# Patient Record
Sex: Female | Born: 1970
Health system: Southern US, Community
[De-identification: ages and names within clinical notes are randomized; demographics above are authoritative.]

## PROBLEM LIST (undated history)

## (undated) DIAGNOSIS — E559 Vitamin D deficiency, unspecified: Secondary | ICD-10-CM

## (undated) DIAGNOSIS — T7840XA Allergy, unspecified, initial encounter: Secondary | ICD-10-CM

## (undated) DIAGNOSIS — J302 Other seasonal allergic rhinitis: Secondary | ICD-10-CM

## (undated) HISTORY — DX: Other seasonal allergic rhinitis: J30.2

## (undated) HISTORY — DX: Allergy, unspecified, initial encounter: T78.40XA

## (undated) HISTORY — PX: TUBAL LIGATION: SHX77

## (undated) HISTORY — PX: HERNIA REPAIR: SHX51

---

## 1999-07-08 ENCOUNTER — Encounter: Payer: Self-pay | Admitting: Family Medicine

## 1999-07-08 ENCOUNTER — Encounter: Admission: RE | Admit: 1999-07-08 | Discharge: 1999-07-08 | Payer: Self-pay | Admitting: Family Medicine

## 2001-05-10 ENCOUNTER — Other Ambulatory Visit: Admission: RE | Admit: 2001-05-10 | Discharge: 2001-05-10 | Payer: Self-pay | Admitting: Gynecology

## 2001-10-07 ENCOUNTER — Encounter: Admission: RE | Admit: 2001-10-07 | Discharge: 2001-10-07 | Payer: Self-pay | Admitting: Gynecology

## 2001-12-11 ENCOUNTER — Inpatient Hospital Stay (HOSPITAL_COMMUNITY): Admission: AD | Admit: 2001-12-11 | Discharge: 2001-12-14 | Payer: Self-pay | Admitting: Gynecology

## 2001-12-11 ENCOUNTER — Inpatient Hospital Stay (HOSPITAL_COMMUNITY): Admission: AD | Admit: 2001-12-11 | Discharge: 2001-12-11 | Payer: Self-pay | Admitting: Gynecology

## 2001-12-11 ENCOUNTER — Encounter (INDEPENDENT_AMBULATORY_CARE_PROVIDER_SITE_OTHER): Payer: Self-pay | Admitting: Specialist

## 2002-01-23 ENCOUNTER — Other Ambulatory Visit: Admission: RE | Admit: 2002-01-23 | Discharge: 2002-01-23 | Payer: Self-pay | Admitting: *Deleted

## 2002-01-26 ENCOUNTER — Encounter: Admission: RE | Admit: 2002-01-26 | Discharge: 2002-01-26 | Payer: Self-pay | Admitting: *Deleted

## 2002-04-26 ENCOUNTER — Ambulatory Visit (HOSPITAL_COMMUNITY): Admission: RE | Admit: 2002-04-26 | Discharge: 2002-04-26 | Payer: Self-pay | Admitting: Internal Medicine

## 2002-04-26 ENCOUNTER — Encounter: Payer: Self-pay | Admitting: Internal Medicine

## 2002-05-23 ENCOUNTER — Emergency Department (HOSPITAL_COMMUNITY): Admission: EM | Admit: 2002-05-23 | Discharge: 2002-05-23 | Payer: Self-pay | Admitting: Emergency Medicine

## 2003-04-16 ENCOUNTER — Other Ambulatory Visit: Admission: RE | Admit: 2003-04-16 | Discharge: 2003-04-16 | Payer: Self-pay | Admitting: Gynecology

## 2003-08-07 ENCOUNTER — Encounter: Admission: RE | Admit: 2003-08-07 | Discharge: 2003-11-05 | Payer: Self-pay | Admitting: Gynecology

## 2003-08-27 ENCOUNTER — Inpatient Hospital Stay (HOSPITAL_COMMUNITY): Admission: AD | Admit: 2003-08-27 | Discharge: 2003-08-27 | Payer: Self-pay | Admitting: Gynecology

## 2003-11-12 ENCOUNTER — Encounter (INDEPENDENT_AMBULATORY_CARE_PROVIDER_SITE_OTHER): Payer: Self-pay | Admitting: Specialist

## 2003-11-12 ENCOUNTER — Inpatient Hospital Stay (HOSPITAL_COMMUNITY): Admission: RE | Admit: 2003-11-12 | Discharge: 2003-11-15 | Payer: Self-pay | Admitting: Gynecology

## 2003-12-24 ENCOUNTER — Other Ambulatory Visit: Admission: RE | Admit: 2003-12-24 | Discharge: 2003-12-24 | Payer: Self-pay | Admitting: Gynecology

## 2004-08-04 ENCOUNTER — Encounter (INDEPENDENT_AMBULATORY_CARE_PROVIDER_SITE_OTHER): Payer: Self-pay | Admitting: *Deleted

## 2004-08-04 ENCOUNTER — Encounter: Admission: RE | Admit: 2004-08-04 | Discharge: 2004-08-04 | Payer: Self-pay | Admitting: *Deleted

## 2004-11-28 ENCOUNTER — Other Ambulatory Visit: Admission: RE | Admit: 2004-11-28 | Discharge: 2004-11-28 | Payer: Self-pay | Admitting: Family Medicine

## 2006-04-26 ENCOUNTER — Other Ambulatory Visit: Admission: RE | Admit: 2006-04-26 | Discharge: 2006-04-26 | Payer: Self-pay | Admitting: Family Medicine

## 2006-05-12 ENCOUNTER — Encounter: Admission: RE | Admit: 2006-05-12 | Discharge: 2006-06-08 | Payer: Self-pay | Admitting: Family Medicine

## 2007-02-13 ENCOUNTER — Emergency Department (HOSPITAL_COMMUNITY): Admission: EM | Admit: 2007-02-13 | Discharge: 2007-02-13 | Payer: Self-pay | Admitting: Emergency Medicine

## 2007-02-24 LAB — CONVERTED CEMR LAB: Pap Smear: NEGATIVE

## 2007-07-19 ENCOUNTER — Other Ambulatory Visit: Admission: RE | Admit: 2007-07-19 | Discharge: 2007-07-19 | Payer: Self-pay | Admitting: Gynecology

## 2007-08-01 ENCOUNTER — Ambulatory Visit (HOSPITAL_COMMUNITY): Admission: RE | Admit: 2007-08-01 | Discharge: 2007-08-01 | Payer: Self-pay | Admitting: *Deleted

## 2009-08-08 ENCOUNTER — Ambulatory Visit: Payer: Self-pay | Admitting: Internal Medicine

## 2009-08-08 LAB — CONVERTED CEMR LAB
ALT: 41 units/L — ABNORMAL HIGH (ref 0–35)
AST: 31 units/L (ref 0–37)
Albumin: 3.7 g/dL (ref 3.5–5.2)
Alkaline Phosphatase: 74 units/L (ref 39–117)
BUN: 10 mg/dL (ref 6–23)
Basophils Absolute: 0 10*3/uL (ref 0.0–0.1)
Basophils Relative: 0.4 % (ref 0.0–3.0)
Bilirubin Urine: NEGATIVE
Bilirubin, Direct: 0.1 mg/dL (ref 0.0–0.3)
CO2: 29 meq/L (ref 19–32)
Calcium: 8.7 mg/dL (ref 8.4–10.5)
Chloride: 108 meq/L (ref 96–112)
Cholesterol: 217 mg/dL — ABNORMAL HIGH (ref 0–200)
Creatinine, Ser: 0.5 mg/dL (ref 0.4–1.2)
Direct LDL: 156.4 mg/dL
Eosinophils Absolute: 0.1 10*3/uL (ref 0.0–0.7)
Eosinophils Relative: 2.4 % (ref 0.0–5.0)
GFR calc non Af Amer: 164.67 mL/min (ref >60)
Glucose, Bld: 83 mg/dL (ref 70–99)
HCT: 36.5 % (ref 36.0–46.0)
HDL: 40 mg/dL (ref >39.00)
Hemoglobin: 12.5 g/dL (ref 12.0–15.0)
Ketones, ur: NEGATIVE mg/dL
Leukocytes, UA: NEGATIVE
Lymphocytes Relative: 28.9 % (ref 12.0–46.0)
Lymphs Abs: 1.7 10*3/uL (ref 0.7–4.0)
MCHC: 34.3 g/dL (ref 30.0–36.0)
MCV: 80.4 fL (ref 78.0–100.0)
Monocytes Absolute: 0.5 10*3/uL (ref 0.1–1.0)
Monocytes Relative: 7.7 % (ref 3.0–12.0)
Neutro Abs: 3.6 10*3/uL (ref 1.4–7.7)
Neutrophils Relative %: 60.6 % (ref 43.0–77.0)
Nitrite: NEGATIVE
Platelets: 272 10*3/uL (ref 150.0–400.0)
Potassium: 5.1 meq/L (ref 3.5–5.1)
RBC: 4.55 M/uL (ref 3.87–5.11)
RDW: 14.4 % (ref 11.5–14.6)
Sodium: 141 meq/L (ref 135–145)
Specific Gravity, Urine: 1.025 (ref 1.000–1.030)
TSH: 0.51 microintl units/mL (ref 0.35–5.50)
Total Bilirubin: 0.3 mg/dL (ref 0.3–1.2)
Total CHOL/HDL Ratio: 5
Total Protein, Urine: NEGATIVE mg/dL
Total Protein: 6.7 g/dL (ref 6.0–8.3)
Triglycerides: 101 mg/dL (ref 0.0–149.0)
Urine Glucose: NEGATIVE mg/dL
Urobilinogen, UA: 0.2 (ref 0.0–1.0)
VLDL: 20.2 mg/dL (ref 0.0–40.0)
WBC: 5.9 10*3/uL (ref 4.5–10.5)
pH: 6.5 (ref 5.0–8.0)

## 2009-08-12 ENCOUNTER — Ambulatory Visit: Payer: Self-pay | Admitting: Internal Medicine

## 2009-08-12 DIAGNOSIS — E669 Obesity, unspecified: Secondary | ICD-10-CM | POA: Insufficient documentation

## 2009-08-12 DIAGNOSIS — Z9189 Other specified personal risk factors, not elsewhere classified: Secondary | ICD-10-CM | POA: Insufficient documentation

## 2009-08-12 DIAGNOSIS — O99810 Abnormal glucose complicating pregnancy: Secondary | ICD-10-CM | POA: Insufficient documentation

## 2009-08-12 DIAGNOSIS — R51 Headache: Secondary | ICD-10-CM | POA: Insufficient documentation

## 2009-08-12 DIAGNOSIS — M4120 Other idiopathic scoliosis, site unspecified: Secondary | ICD-10-CM | POA: Insufficient documentation

## 2009-08-12 DIAGNOSIS — R519 Headache, unspecified: Secondary | ICD-10-CM | POA: Insufficient documentation

## 2009-08-12 DIAGNOSIS — E785 Hyperlipidemia, unspecified: Secondary | ICD-10-CM | POA: Insufficient documentation

## 2009-08-14 ENCOUNTER — Encounter: Payer: Self-pay | Admitting: Internal Medicine

## 2009-08-16 ENCOUNTER — Emergency Department (HOSPITAL_COMMUNITY): Admission: EM | Admit: 2009-08-16 | Discharge: 2009-08-17 | Payer: Self-pay | Admitting: Emergency Medicine

## 2010-03-25 NOTE — Assessment & Plan Note (Signed)
Summary: NEW PT PHYSICAL--[employer]--PKG--S-TC   Vital Signs:  Patient profile:   40 year old female Height:      62 inches (157.48 cm) Weight:      161.8 pounds (73.55 kg) BMI:     29.70 O2 Sat:      97 % on Room air Temp:     97.4 degrees F (36.33 degrees C) oral Pulse rate:   83 / minute BP sitting:   112 / 80  (left arm) Cuff size:   regular  Vitals Entered By: Orlan Leavens (August 12, 2009 3:18 PM)  O2 Flow:  Room air CC: New patient CPX Is Patient Diabetic? No Pain Assessment Patient in pain? no        Primary Care Provider:  Newt Lukes MD  CC:  New patient CPX.  History of Present Illness: new pt to me and our practice, here to est care  patient is here today for annual physical. Patient feels well and has no complaints.   also has 2 other concerns-  1) obesity and weight gain - over 20# up since her 2nd pregnancy - attributes to poor diet and no time for exercise - +personal hx gestational DM -  wanted lab check to be sure DM not problem now no bowle changes or med changes -  2) back pain - ongoing for months - pain from base of neck down to waist - pain precipitated by lying down -- onset of pain if lying down >4 hours -  has to get up in the night b/c pain - tried muscle relaxants in past w/o relief of pain - no leg pain or numbness - no injury or falls- hx scoliosis  Preventive Screening-Counseling & Management  Alcohol-Tobacco     Alcohol drinks/day: 0     Alcohol Counseling: not indicated; patient does not drink     Smoking Status: never     Tobacco Counseling: not indicated; no tobacco use  Caffeine-Diet-Exercise     Diet Counseling: to improve diet; diet is suboptimal     Nutrition Referrals: yes     Does Patient Exercise: no     Exercise Counseling: to improve exercise regimen     Depression Counseling: not indicated; screening negative for depression  Safety-Violence-Falls     Seat Belt Use: yes     Seat Belt Counseling: not  indicated; patient wears seat belts     Helmet Counseling: not indicated; patient wears helmet when riding bicycle/motocycle     Firearms in the Home: no firearms in the home     Firearm Counseling: not applicable     Violence Counseling: not indicated; no violence risk noted     Fall Risk Counseling: not indicated; no significant falls noted  Clinical Review Panels:  Prevention   Last Mammogram:  No specific mammographic evidence of malignancy.   (02/23/2006)   Last Pap Smear:  Interpretation/Result:Negative for intraepithelial Lesion or Malignancy.    (02/24/2007)  Immunizations   Last Tetanus Booster:  Tdap (08/12/2009)  Lipid Management   Cholesterol:  217 (08/08/2009)   HDL (good cholesterol):  40.00 (08/08/2009)  CBC   WBC:  5.9 (08/08/2009)   RBC:  4.55 (08/08/2009)   Hgb:  12.5 (08/08/2009)   Hct:  36.5 (08/08/2009)   Platelets:  272.0 (08/08/2009)   MCV  80.4 (08/08/2009)   MCHC  34.3 (08/08/2009)   RDW  14.4 (08/08/2009)   PMN:  60.6 (08/08/2009)   Lymphs:  28.9 (08/08/2009)   Monos:  7.7 (08/08/2009)   Eosinophils:  2.4 (08/08/2009)   Basophil:  0.4 (08/08/2009)  Complete Metabolic Panel   Glucose:  83 (08/08/2009)   Sodium:  141 (08/08/2009)   Potassium:  5.1 (08/08/2009)   Chloride:  108 (08/08/2009)   CO2:  29 (08/08/2009)   BUN:  10 (08/08/2009)   Creatinine:  0.5 (08/08/2009)   Albumin:  3.7 (08/08/2009)   Total Protein:  6.7 (08/08/2009)   Calcium:  8.7 (08/08/2009)   Total Bili:  0.3 (08/08/2009)   Alk Phos:  74 (08/08/2009)   SGPT (ALT):  41 (08/08/2009)   SGOT (AST):  31 (08/08/2009)   Current Medications (verified): 1)  None  Allergies (verified): No Known Drug Allergies  Past History:  Past Medical History: Thyroid problem, hx (postpartum 2003) scoliosis, childhood gestational diabetes = 2003 + 2005  Past Surgical History: Caesarean section x's 2 (03 & 05) Inguinal herniorrhaphy (2010)  Family History: Family History  Diabetes 1st degree relative (grandparent) Family History Hypertension (grandparent) Dad - age 69y- CAD/Stent mom - age 38 - ?heart valve problem  Social History: Never Smoked, no alcohol married, lives with spouse and 2 kids  employed with Set [employer] - standing all daySmoking Status:  never Does Patient Exercise:  no Seat Belt Use:  yes  Review of Systems       see HPI above. I have reviewed all other systems and they were negative.   Physical Exam  General:  overweight-appearing.  alert, well-developed, well-nourished, and cooperative to examination.    Eyes:  vision grossly intact; pupils equal, round and reactive to light.  conjunctiva and lids normal.   wears glasses Ears:  normal pinnae bilaterally, without erythema, swelling, or tenderness to palpation. TMs clear, without effusion, or cerumen impaction. Hearing grossly normal bilaterally  Mouth:  teeth and gums in good repair; mucous membranes moist, without lesions or ulcers. oropharynx clear without exudate, no erythema.  Neck:  supple, full ROM, no masses, no thyromegaly; no thyroid nodules or tenderness. no JVD or carotid bruits.   Lungs:  normal respiratory effort, no intercostal retractions or use of accessory muscles; normal breath sounds bilaterally - no crackles and no wheezes.    Heart:  normal rate, regular rhythm, no murmur, and no rub. BLE without edema.     Abdomen:  soft, non-tender, normal bowel sounds, no distention; no masses and no appreciable hepatomegaly or splenomegaly.   Genitalia:  defer to gyn Msk:  No deformity or significant scoliosis noted of thoracic or lumbar spine.  no joint effusions knnes or ankles - back: full range of motion of lumbar spine. Nontender to palpation. Negative straight leg raise. Deep tendon reflexes symmetrically intact at Achilles and patella, negative clonus. Sensation intact throughout all dermatomes in bilateral lower extremities. Full strength to manual muscle testing in all  major muscule groups including EHL, anterior tibialis, gastrocnemius, quadriceps, and iliopsoas. Able to heel and toe walk without difficulty and ambulates with a normal gait.  Neurologic:  alert & oriented X3 and cranial nerves II-XII symetrically intact.  strength normal in all extremities, sensation intact to light touch, and gait normal. speech fluent without dysarthria or aphasia; follows commands with good comprehension.  Skin:  no rashes, vesicles, ulcers, or erythema. No nodules or irregularity to palpation.  Psych:  Oriented X3, memory intact for recent and remote, normally interactive, good eye contact, not anxious appearing, not depressed appearing, and not agitated.      Impression & Recommendations:  Problem # 1:  PREVENTIVE HEALTH  CARE (ICD-V70.0) Patient has been counseled on age-appropriate routine health concerns for screening and prevention. These are reviewed and up-to-date. Immunizations are up-to-date or declined. Labs reviewed - will watch cholesterol and work and weight loss  Problem # 2:  DYSLIPIDEMIA (ICD-272.4) likely related to weight gain - refer to nutritionist for help with weight reduction - reck 6-12 mos - can consider tx given FH idf no sig change with weight loss Labs Reviewed: SGOT: 31 (08/08/2009)   SGPT: 41 (08/08/2009)   HDL:40.00 (08/08/2009)  Chol:217 (08/08/2009)  Trig:101.0 (08/08/2009)  Problem # 3:  SCOLIOSIS (ICD-737.30) hx same, now with nighttime back pain if lying down - refer for update eval and tx of pain regardless of cause - prior tx muscle relaxant ineffective per pt - neuro exam benign Orders: Orthopedic Surgeon Referral (Ortho Surgeon)  Problem # 4:  OBESITY (ICD-278.00) no thyroid or DM problem at this time on CPX labs - refer to nutrition for help with weight loss efforts, esp with  dyslipidemia and hx gest DM Orders: Nutrition Referral (Nutrition)  Ht: 62 (08/12/2009)   Wt: 161.8 (08/12/2009)   BMI: 29.70  (08/12/2009)  Problem # 5:  Hx of GESTATIONAL DIABETES (ICD-648.80) hx noted - 2005 and 2005 - glc normal fasting on cpx labs - importance of weight control as above Orders: Nutrition Referral (Nutrition)  Other Orders: Tdap => 20yrs IM (13086) Admin 1st Vaccine (57846)  Patient Instructions: 1)  it was good to see you today. 2)  labs reviewed - everything looks good except cholesterol -  we will watch this at future visits - 3)  it is important that you work on losing weight - monitor your diet and consume fewer calories such as less carbohydrates (sugar) and less fat. you also need to increase your physical activity level - start by walking for 10-20 minutes 3 times per week and work up to 30 minutes 4-5 times each week.  4)  we'll make referral to nurtitionist to help with this goal of weight loss. Our office will contact you regarding this appointment once made.  5)  also we'll make referral to scoliosis and back specialist for your back pain. Our office will contact you regarding this appointment once made.  6)  Please schedule a follow-up appointment in 6-12 months on cholesterol, sooner if problems.    Immunizations Administered:  Tetanus Vaccine:    Vaccine Type: Tdap    Site: left deltoid    Mfr: GlaxoSmithKline    Dose: 0.5 ml    Route: IM    Given by: Orlan Leavens    Exp. Date: 05/17/2011    Lot #: [id number]    VIS given: 08/12/09    Mammogram  Procedure date:  02/23/2006  Findings:      No specific mammographic evidence of malignancy.    Pap Smear  Procedure date:  02/24/2007  Findings:      Interpretation/Result:Negative for intraepithelial Lesion or Malignancy.

## 2010-03-25 NOTE — Letter (Signed)
Summary: No show/Nutri-Dbs-Mgmt  No show/Nutri-Dbs-Mgmt   Imported By: Lester [place] 08/16/2009 08:20:05  _____________________________________________________________________  External Attachment:    Type:   Image     Comment:   External Document

## 2010-05-11 LAB — BASIC METABOLIC PANEL
BUN: 8 mg/dL (ref 6–23)
CO2: 22 mEq/L (ref 19–32)
Calcium: 8.4 mg/dL (ref 8.4–10.5)
Chloride: 111 mEq/L (ref 96–112)
Creatinine, Ser: 0.49 mg/dL (ref 0.4–1.2)
GFR calc Af Amer: >60 mL/min (ref >60)
GFR calc non Af Amer: >60 mL/min (ref >60)
Glucose, Bld: 101 mg/dL — ABNORMAL HIGH (ref 70–99)
Potassium: 3.9 mEq/L (ref 3.5–5.1)
Sodium: 138 mEq/L (ref 135–145)

## 2010-05-11 LAB — CK TOTAL AND CKMB (NOT AT ARMC)
CK, MB: 1.2 ng/mL (ref 0.3–4.0)
Relative Index: 0.7 (ref 0.0–2.5)
Total CK: 180 U/L — ABNORMAL HIGH (ref 7–177)

## 2010-05-11 LAB — CBC
HCT: 34.7 % — ABNORMAL LOW (ref 36.0–46.0)
Hemoglobin: 11.3 g/dL — ABNORMAL LOW (ref 12.0–15.0)
MCH: 26.6 pg (ref 26.0–34.0)
MCHC: 32.6 g/dL (ref 30.0–36.0)
MCV: 81.5 fL (ref 78.0–100.0)
Platelets: 231 10*3/uL (ref 150–400)
RBC: 4.25 MIL/uL (ref 3.87–5.11)
RDW: 13.9 % (ref 11.5–15.5)
WBC: 5.3 10*3/uL (ref 4.0–10.5)

## 2010-05-11 LAB — DIFFERENTIAL
Basophils Absolute: 0 10*3/uL (ref 0.0–0.1)
Basophils Relative: 0 % (ref 0–1)
Eosinophils Absolute: 0.1 10*3/uL (ref 0.0–0.7)
Eosinophils Relative: 2 % (ref 0–5)
Lymphocytes Relative: 27 % (ref 12–46)
Lymphs Abs: 1.4 10*3/uL (ref 0.7–4.0)
Monocytes Absolute: 0.6 10*3/uL (ref 0.1–1.0)
Monocytes Relative: 12 % (ref 3–12)
Neutro Abs: 3.1 10*3/uL (ref 1.7–7.7)
Neutrophils Relative %: 59 % (ref 43–77)

## 2010-05-11 LAB — D-DIMER, QUANTITATIVE: D-Dimer, Quant: 0.6 ug/mL-FEU — ABNORMAL HIGH (ref 0.00–0.48)

## 2010-05-11 LAB — TROPONIN I: Troponin I: 0.03 ng/mL (ref 0.00–0.06)

## 2010-07-08 NOTE — Op Note (Signed)
NAMESAHANA, Dana Rojas             ACCOUNT NO.:  [id number]   MEDICAL RECORD NO.:  [id number]          PATIENT TYPE:  AMB   LOCATION:  SDS                          FACILITY:  [facility]   PHYSICIAN:  Alfonse Ras, MD   DATE OF BIRTH:  October 08, 1970   DATE OF PROCEDURE:  08/01/2007  DATE OF DISCHARGE:  08/01/2007                               OPERATIVE REPORT   PREOPERATIVE DIAGNOSIS:  Left inguinal hernia.   POSTOPERATIVE DIAGNOSIS:  Left inguinal hernia.   PROCEDURE:  Left inguinal hernia repair with 4 x 6 Proceed mesh.   ANESTHESIA:  General laryngeal mask.   SURGEON:  Alfonse Ras, MD   DESCRIPTION:  The patient was taken to the operating room, placed in the  supine position.  After adequate general anesthesia was induced using  laryngeal mask, the abdomen was prepped and draped in the normal sterile  fashion.  Using an oblique incision extending from the anterior superior  iliac spine to the previous scar from the C-section, I dissected down  through the skin to the external oblique fascia.  This was opened along  its fibers.  The fascia was mobilized.  Transversalis fascia was  identified and the Cooper's ligament and inguinal ligament were  dissected.  The floor of Hesselbach triangle was very weak and was  reinforced with interrupted #1 Surgilon sutures approximating the  transversalis fascia to Cooper's ligament and to the inguinal ligament  in an interrupted fashion.  A small relaxing incision was made in the  transversalis fascia.  This provided a tension-free repair.  It was  reinforced with the piece of 4 x 6 Proceed mesh which was tacked from  the pubic tubercle along the inguinal ligament and laterally out to the  anterior superior iliac spine.  This was sutured in place with a running  2-0 Prolene suture.  All tissues were injected with 0.5 Marcaine.  The  external oblique fascia was closed with a running 3-0 Vicryl.  Skin  incision was closed with subcuticular  3-0 Monocryl.  Steri-Strips and  sterile dressings were applied.  The patient tolerated the procedure  well and went to PACU in good condition.      Alfonse Ras, MD  Electronically Signed     KRE/MEDQ  D:  08/01/2007  T:  08/01/2007  Job:  [id number]

## 2010-07-11 NOTE — Discharge Summary (Signed)
   [name], Dana Rojas                         ACCOUNT NO.:  [id number]   MEDICAL RECORD NO.:  [id number]                   PATIENT TYPE:  INP   LOCATION:  9132                                 FACILITY:  [facility]   PHYSICIAN:  Timothy P. Fontaine, M.D.           DATE OF BIRTH:  September 17, 1970   DATE OF ADMISSION:  12/11/2001  DATE OF DISCHARGE:  12/14/2001                                 DISCHARGE SUMMARY   DISCHARGE DIAGNOSES:  1. Intrauterine pregnancy at 38+ weeks, delivered.  2. Gestational diabetes.  3. Prolonged rupture of membranes, probably early chorioamnionitis.  4. Arrested of first stage of labor.  5. Nonreassuring fetal heart rate tracing.  6. Status post primary low uterine segment transverse cesarean section by     Dr. Reynaldo Minium on December 11, 2001.   HISTORY:  This is a 30-years-of-age female gravida 1 para 0 with an EDC of  December 21, 2001.  Prenatal course was complicated by the patient being  rubella equivocal and gestational diabetes.   HOSPITAL COURSE:  On December 11, 2001 the patient was admitted with  spontaneous rupture of membranes, noted to have some meconium-stained  amniotic fluid, had amnioinfusion, augmented with Pitocin with several  episodes of fetal bradycardia, and progressed however to  9 cm, arrested in the first stage of labor with elevated temperature of 102  axillary.  The patient subsequently on December 11, 2001 underwent a primary  low uterine segment transverse cesarean section by Dr. Reynaldo Minium and  underwent delivery of a female, Apgars of 5 and 9, weight of 8 pounds 10  ounces.  There were no complications.  Postpartum, the patient remained  afebrile, voiding, stable condition, and was discharged to home on December 12, 2001 in satisfactory condition and given [facility]  postpartum instruction.   LABORATORY DATA:  The patient is rubella equivocal.  On December 12, 2001  hemoglobin was 10.4.   DISPOSITION:  1. The  patient is discharged home.  2. Follow up in four to six weeks; if had any problem prior to that time to     be seen in the office.  3. Given a prescription for Tylox p.r.n. pain.  4. She was ordered to be given the rubella vaccine prior to discharge.     Susa Loffler, P.A.                    Timothy P. Fontaine, M.D.    TSG/MEDQ  D:  01/13/2002  T:  01/13/2002  Job:  [id number]

## 2010-07-11 NOTE — H&P (Signed)
Dana Rojas, ALCORTA NO.:  [id number]   MEDICAL RECORD NO.:  [id number]                   PATIENT TYPE:  INP   LOCATION:  9166                                 FACILITY:  [facility]   PHYSICIAN:  Juan H. Lily Peer, M.D.             DATE OF BIRTH:  10/21/1970   DATE OF ADMISSION:  12/11/2001  DATE OF DISCHARGE:                                HISTORY & PHYSICAL   CHIEF COMPLAINT:  Contractions with spontaneous rupture of membranes.   HISTORY OF PRESENT ILLNESS:  The patient is a 40 year old, G1, P0 with a  corrected estimated date of confinement based on ultrasound of December 21, 2001.  Currently, she is 38-1/2 weeks' gestation.  She presented to [facility] early this morning having been seen earlier complaining of  contractions, but was in early labor.  She returned and there was question  whether she had rupture of membranes with meconium stained amniotic fluids.  She was contracting every three minutes apart and very intense and her  cervix was 4 cm dilated and she was admitted.  Upon arrival to labor and  delivery, she was reexamined with her cervix 4+, 80% effaced with -2 to -3  station.  We attempted artificial rupture of membranes with minimal fluid,  although meconium was noted.  A fetal scalp electrode and IUPC were placed.  The patient's GBS status was negative.   PRENATAL COURSE:  Her prenatal course was significant for the fact that she  was a gestational diabetic, diet-controlled.  She had iron deficiency anemia  for which she was on supplemental iron, otherwise no other complications  during the pregnancy.   REVIEW OF SYMPTOMS:  See hospital form.   ALLERGIES:  No known drug allergies.   PAST SURGICAL HISTORY:  1. Tonsillectomy.  2. Wisdom teeth repair.  3. Hernia repair in the past.   PAST MEDICAL HISTORY:  Denies any history of STDs in the past.   PHYSICAL EXAMINATION:  VITAL SIGNS:  Blood pressure 123/60, pulse 86.  HEENT:   Unremarkable.  NECK:  Supple.  Trachea midline.  No carotid bruits or thyromegaly.  LUNGS:  Clear to auscultation without rhonchi or wheezes.  HEART:  Regular rate and rhythm with no murmur, rub or gallop.  BREASTS:  Not done.  ABDOMEN:  Gravid uterus in vertex presentation by Leopold's maneuver.  PELVIC:  Cervix is 4+, 80-90% effaced, -2 to -3 station.  Meconium stained  amniotic fluid, although minimal.  EXTREMITIES:  DTRs 1+.  Negative clonus.   PRENATAL LABORATORY DATA:  The patient is A positive.  Negative antibody  screen.  VDRL nonreactive.  HIV and hepatitis negative.  Rubella was  equivocally positive.  Alpha fetoprotein was normal.  GBS culture negative.  Abnormal diabetes screen.  GBS culture negative.   ASSESSMENT/PLAN:  This is a 40 year old, G1, P0 at 38-1/2 weeks estimated  gestational age with spontaneous rupture of membranes  at approximately 0530  hours with meconium stained amniotic fluid.  Reassuring fetal heart rate  tracing.  Contractions every three to six minutes apart, although the  patient is uncomfortable.  Fetal scalp electrode and intrauterine pressure  catheter were placed.  We will start an amnioinfusion consisting of 300 cc  bolus of lactated Ringer's followed by 100 cc per hour.  The patient will  have an epidural on board and once this has taken effect, we will begin  Pitocin augmentation for protocol.  Group B Streptococci status is negative.  Will continue to monitor the fetus closely and anticipate vaginal delivery.  The patient has an average sized baby on clinical grounds between 7 and 8  pounds.                                               Juan H. Lily Peer, M.D.    JHF/MEDQ  D:  12/11/2001  T:  12/11/2001  Job:  [id number]

## 2010-07-11 NOTE — Op Note (Signed)
NAME:  Dana Rojas, Dana Rojas                       ACCOUNT NO.:  [id number]   MEDICAL RECORD NO.:  [id number]                   PATIENT TYPE:  INP   LOCATION:  9101                                 FACILITY:  [facility]   PHYSICIAN:  Juan H. Lily Peer, M.D.             DATE OF BIRTH:  Dec 11, 1970   DATE OF PROCEDURE:  11/12/2003  DATE OF DISCHARGE:                                 OPERATIVE REPORT   PREOPERATIVE DIAGNOSES:  1.  Term intrauterine pregnancy at 39-1/2 weeks estimated gestational age.  2.  Gestational diabetes, diet controlled.  3.  Previous cesarean section requesting elective repeat.  4.  Request for elective permanent sterilization.   POSTOPERATIVE DIAGNOSES:  1.  Term intrauterine pregnancy at 39-1/2 weeks estimated gestational age.  2.  Gestational diabetes, diet controlled.  3.  Previous cesarean section requesting elective repeat.  4.  Request for elective permanent sterilization.   OPERATION PERFORMED:  1.  Scar revision.  2.  Repeat lower uterine segment transverse cesarean section.  3.  Bilateral tubal sterilization procedure, Pomeroy technique.   SURGEON:  Juan H. Lily Peer, M.D.   ASSISTANT:  Ivor Costa. Farrel Gobble, M.D.   ANESTHESIA:  Spinal.   FINDINGS:  1.  Patient with a keloid in the previous Pfannenstiel skin incision.  2.  Viable female infant, Apgars 9 and 9.  3.  Weight 6 pounds 9 ounces.  4.  Normal maternal pelvic anatomy.  5.  Clear amniotic fluid.   INDICATIONS FOR PROCEDURE:  The patient is a 40 year old gravida 2, para 1  at 39-1/2 weeks estimated gestational age with previous cesarean section,  requesting elective repeat.  Also patient requesting elective permanent  sterilization for which she was counseled as to risks and benefits, pros and  cons and failure rate.  Also patient gestational diabetic, diet controlled.   DESCRIPTION OF PROCEDURE:  After the patient was adequately counseled, she  was taken to the operating room where she underwent a  successful spinal  anesthesia placement.  A Foley catheter was in place in effort to monitor  urinary output after the drapes were in place.  The scar revision was  undertaken by excising the previous keloid Pfannenstiel scar and sent off  for pathologic evaluation.  The incision was then extended down through the  subcutaneous tissue down to the rectus fascia whereby a midline nick was  made.  The fascia was incised in transverse fashion.  The midline raphe was  entered.  The peritoneal cavity was entered cautiously.  The bladder flap  was established.  The lower uterine segment was incised in transverse  fashion.  Clear amniotic fluid was present.  The newborn was delivered, gave  immediate cry.  Apgars were given as 9 and 9 with a weight of 6 pounds and 9  ounces.  Normal maternal tubes and ovaries.  The Ptocin drip started as a  uterotonic agent consisting of 20 units of Pitocin in a  liter of lactated  Ringers.  Also due to the fact that the patient had some mild uterine atony,  she was given 0.2 mg IM of methergine.  The lower uterine segment transverse  incision was closed in a single running locking stitch of 0 Vicryl suture.  Attention was then placed to the proximal one third of the right fallopian  tube which was placed under tension with a Babcock clamp.  A 2 cm segment  was secured with 3-0 Vicryl suture times two and a 2 cm segment was excised  and passed off the operative field.  The remaining stumps were Bovie  cauterized.  A similar procedure was carried out on the contralateral side  and the uterus was placed back into the pelvis cavity.  Good uterine tone  was evident and adequate hemostasis was evident.  Sponge and needle counts  were correct and the visceral peritoneum was now reapproximated and the  rectus fascia with closed with a running stitch of 0 Vicryl suture.  The  subcutaneous bleeders were Bovie cauterized.  The skin was reapproximated  with skin clips followed  by placement of Xeroform gauze and a 4 x 8  dressing. The patient was transferred to the recovery room with stable vital  signs.  Blood loss was recorded at 800 mL.  IV fluids 4800 mL lactated  Ringers.  Urine output 200 mL and clear.  She received 1 g of Cefotan.      JHF/MEDQ  D:  11/12/2003  T:  11/13/2003  Job:  [id number]

## 2010-07-11 NOTE — Discharge Summary (Signed)
Dana Rojas, SON NO.:  [id number]   MEDICAL RECORD NO.:  [id number]          PATIENT TYPE:  INP   LOCATION:  9101                          FACILITY:  [facility]   PHYSICIAN:  Ivor Costa. Farrel Gobble, M.D. DATE OF BIRTH:  1970/05/03   DATE OF ADMISSION:  11/12/2003  DATE OF DISCHARGE:  11/15/2003                                 DISCHARGE SUMMARY   PRINCIPAL DIAGNOSIS:  Term pregnancy.   PRINCIPAL PROCEDURE:  Repeat cesarean section.   ADDITIONAL PROCEDURE:  Bilateral tubal ligation.   HISTORY OF PRESENT ILLNESS:  Refer to the dictated H&P.  The patient  presented on the morning of November 12, 2003 and underwent an elective  repeat cesarean section with delivery of a viable female, birth weight 6  pounds 9 ounces, Apgars 9 and 9, under epidural anesthesia.  She also  underwent a bilateral tubal ligation.  The postoperative course was  unremarkable.  On postoperative day #1 the patient was ambulating without  difficulty, tolerating a regular diet.  By postoperative day #3 she was  without complaints and was ready for discharge.  Her abdomen was soft,  nontender.  Incision was intact with staples.  Her extremities were  nontender.  Laboratory data:  Her white count was 9.3, her hemoglobin was  10, her hematocrit was 29.3, and her platelets were 177 at the time of  discharge.  The patient is discharged home with instructions to follow up in  the office 6 weeks postpartum, not to drive for the first 2 weeks.  She will  have her staples removed with Steri-Strips placed prior to discharge.  She  was given a prescription for Tylox one to two q.6h. p.r.n. pain #30 and she  will also use over-the-counter Motrin.      THL/MEDQ  D:  11/15/2003  T:  11/15/2003  Job:  [id number]

## 2010-07-11 NOTE — Op Note (Signed)
Dana Rojas, Dana Rojas                         ACCOUNT NO.:  [id number]   MEDICAL RECORD NO.:  [id number]                   PATIENT TYPE:  INP   LOCATION:  9132                                 FACILITY:  [facility]   PHYSICIAN:  Juan H. Lily Peer, M.D.             DATE OF BIRTH:  [birth year]   DATE OF PROCEDURE:  12/11/2001  DATE OF DISCHARGE:                                 OPERATIVE REPORT   INDICATION FOR OPERATION:  This 40 year old gravida 1, para 0, 30 1/2 weeks  admitting gestational age with spontaneous rupture of membranes  approximately 5:30 a.m. this morning who had meconium-stained amniotic  fluid, had amnioinfusion, was augmented with Pitocin, had several episodes  of fetal bradycardia, progressed to 9 cm, and arrested at the first stage of  labor with elevated temperature of 102 axillary.   PREOPERATIVE DIAGNOSES:  1. Term intrauterine pregnancy.  2. Prolonged rupture of membranes/probable early chorioamnionitis.  3. Arrested first stage of labor.  4. Nonreassuring fetal heart rate tracing.   POSTOPERATIVE DIAGNOSES:  1. Term intrauterine pregnancy.  2. Prolonged rupture of membranes/probable early chorioamnionitis.  3. Arrested first stage of labor.  4. Nonreassuring fetal heart rate tracing.   SURGEON:  Juan H. Lily Peer, M.D.   ANESTHESIA:  Epidural.   PROCEDURE PERFORMED:  Primary low uterine segment transverse cesarean  section.   FINDINGS:  Meconium-stained amniotic fluid, nuchal cord x1.  Female infant,  Apgars of 5 and 9.  Arterial cord pH is 7.28, weight of 8 pounds 10 ounces.   DESCRIPTION OF OPERATION:  After the patient was adequately counseled, she  was taken to the operating room where she was placed in the supine position  with a Foley catheter in place and epidural already on board.  She was  tested and good epidural level had been present.  The abdomen was prepped  and draped in the usual sterile fashion.  A Pfannenstiel skin incision was  made 3 cm  above the symphysis pubis.  The incision was carried out through  the skin and subcutaneous tissue down to the rectal fascia whereby a midline  nick was made.  The fascia was incised in a transverse fashion.  The  peritoneal cavity was entered cautiously.  The bladder flap was established.  The lower uterine segment was incised in a transverse fashion.  Meconium-  stained amniotic fluid was present.  The newborn's head was delivered after  the nuchal cord was reduced.  The nasopharyngeal airway and trachea were  DeLee suctioned for several cc.  The rest of the baby was delivered.  The  cord was doubly clamped and excised.  Shown to the parents and immediately  passed off to the pediatricians who were in attendance and noted the above-  mentioned findings.  After cord blood was obtained, the patient had received  grams of Cefotan prior to commencement of the operation with the suspicion  for early chorioamnionitis.  A Pitocin drip was started.  The placenta was  delivered from the intrauterine cavity.  The uterus was then exteriorized.  The uterus was closed in a single locking stitch manner with 0 Vicryl  suture.  She did have a line of extension of the transverse incision down on  her right side which required an additional suture for reapproximation.  The  patient had normal tubes and ovaries.  The uterus was placed back into the  abdominal cavity.  The pelvic cavity was then copiously irrigated with  normal saline solution.  Assessment to the lower uterine segment  demonstrated adequate hemostasis and closure was started.  Sponge count and  needle count were correct.  The visceral peritoneum was now reapproximated.  The rectus fascia was closed with a running stitch of 0 Vicryl suture.  The  subcutaneous bleeders were Bovie cauterized.  The skin was reapproximated  with skin clips followed by Xeroform gauze and 4x8 dressing.  The patient  was transferred to the recovery room with stable  vital signs.  Estimated  blood loss in procedure was 600 cc, IV fluids was 2 liters of lactated  Ringers' and urine output was 150 cc and clear.                                                Juan H. Lily Peer, M.D.    JHF/MEDQ  D:  12/11/2001  T:  12/11/2001  Job:  [id number]

## 2010-07-11 NOTE — H&P (Signed)
NAME:  Dana Rojas, Dana Rojas                       ACCOUNT NO.:  [id number]   MEDICAL RECORD NO.:  [id number]                   PATIENT TYPE:  INP   LOCATION:  NA                                   [facility]:  [facility]   PHYSICIAN:  Juan H. Lily Peer, M.D.             DATE OF BIRTH:  06-06-1970   DATE OF ADMISSION:  DATE OF DISCHARGE:                                HISTORY & PHYSICAL   DATE OF SCHEDULED SURGERY:  Monday, November 12, 2003 at 1 p.m.   CHIEF COMPLAINT:  1.  Term intrauterine pregnancy at 39+ weeks estimated gestational age.  2.  Previous cesarean section, request for a repeat.  3.  Gestational diabetic, diet controlled.   HISTORY:  The patient is a 40 year old gravida 2 para 1 at 39-and-a-half  weeks estimated gestational age who has requested for elective repeat  cesarean section.  Her previous cesarean section was secondary to arrest of  the first stage of labor.  She reached 9 cm and had a nonreassuring fetal  heart rate tracing.  The patient also had chorioamnionitis with that  pregnancy.  She had been counseled as to the risks, benefits, pros and cons  of vaginal birth after C-section versus a repeat cesarean section and has  opted to proceed with a repeat cesarean section.  The patient had  gestational diabetes which was controlled by diet, had antepartum testing  with NSTs weekly after [redacted] weeks gestation, and otherwise had done well  throughout her pregnancy.   PAST MEDICAL HISTORY:  Previous cesarean section in 2003.  The patient  denies any allergies.  GBS culture was negative.  She has also had a  tonsillectomy and adenoidectomy in the past and wisdom tooth surgery, and  hernia repair as a child.   REVIEW OF SYSTEMS:  See [place] form.   PHYSICAL EXAMINATION:  VITAL SIGNS:  The patient's weight is 154 pounds,  blood pressure 106/68.  Urine was negative for protein and glucose.  HEENT:  Unremarkable .  NECK:  Supple, trachea midline.  No carotid bruits, no  thyromegaly.  LUNGS:  Clear to auscultation without rhonchi or wheezes.  HEART:  Regular rate and rhythm, no murmurs or gallops.  BREAST:  Exam not done.  ABDOMEN:  Gravid uterus, vertex presentation by [employer].  PELVIC:  Cervix is long, closed, and posterior.  EXTREMITIES:  DTRs 1+, negative clonus.   PRENATAL LABORATORY DATA:  A positive blood type, negative antibody screen.  VDRL was nonreactive.  Rubella immune.  Hepatitis B surface antigen and HIV  were negative.  The patient had an abnormal diabetes screen as well as 3-  hour GTT, and GBS culture was negative.   ASSESSMENT:  A 40 year old gravida 2 para 1 at 39-and-a-half weeks estimated  gestational age with history of gestational diabetes, previous cesarean  section, has elected to proceed with elective repeat despite being counseled  as to the risks, benefits, and pros  and cons of repeat cesarean section  versus a trial of labor, as well as literature information from the [employer] [facility] having been previously provided.  They have decided to  proceed with an elective repeat.  All questions were answered and will  follow accordingly.   PLAN:  The patient scheduled for elective repeat cesarean section Monday,  November 12, 2003 at [facility] at 1 p.m.                                               Juan H. Lily Peer, M.D.    JHF/MEDQ  D:  11/09/2003  T:  11/09/2003  Job:  [id number]

## 2010-11-20 LAB — CBC
HCT: 38.6
Hemoglobin: 12.9
MCHC: 33.5
MCV: 80.8
Platelets: 311
RBC: 4.78
RDW: 13.3
WBC: 6.4

## 2011-06-23 ENCOUNTER — Ambulatory Visit (INDEPENDENT_AMBULATORY_CARE_PROVIDER_SITE_OTHER): Payer: BC Managed Care – PPO | Admitting: Family Medicine

## 2011-06-23 VITALS — BP 103/68 | HR 79 | Temp 98.5°F | Resp 18 | Ht 63.0 in | Wt 161.0 lb

## 2011-06-23 DIAGNOSIS — J029 Acute pharyngitis, unspecified: Secondary | ICD-10-CM

## 2011-06-23 DIAGNOSIS — R5381 Other malaise: Secondary | ICD-10-CM

## 2011-06-23 DIAGNOSIS — E162 Hypoglycemia, unspecified: Secondary | ICD-10-CM

## 2011-06-23 LAB — POCT CBC
Granulocyte percent: 70.7 %G (ref 37–80)
HCT, POC: 39.4 % (ref 37.7–47.9)
Hemoglobin: 12.5 g/dL (ref 12.2–16.2)
Lymph, poc: 2 (ref 0.6–3.4)
MCH, POC: 25.5 pg — AB (ref 27–31.2)
MCHC: 31.7 g/dL — AB (ref 31.8–35.4)
MCV: 80.5 fL (ref 80–97)
MID (cbc): 0.4 (ref 0–0.9)
MPV: 8 fL (ref 0–99.8)
POC Granulocyte: 5.8 (ref 2–6.9)
POC LYMPH PERCENT: 24 %L (ref 10–50)
POC MID %: 5.3 %M (ref 0–12)
Platelet Count, POC: 333 10*3/uL (ref 142–424)
RBC: 4.9 M/uL (ref 4.04–5.48)
RDW, POC: 14.5 %
WBC: 8.2 10*3/uL (ref 4.6–10.2)

## 2011-06-23 LAB — POCT RAPID STREP A (OFFICE): Rapid Strep A Screen: NEGATIVE

## 2011-06-23 LAB — POCT GLYCOSYLATED HEMOGLOBIN (HGB A1C): Hemoglobin A1C: 5.7

## 2011-06-23 LAB — COMPREHENSIVE METABOLIC PANEL
ALT: 43 U/L — ABNORMAL HIGH (ref 0–35)
AST: 32 U/L (ref 0–37)
Albumin: 4.4 g/dL (ref 3.5–5.2)
Alkaline Phosphatase: 71 U/L (ref 39–117)
BUN: 10 mg/dL (ref 6–23)
CO2: 26 mEq/L (ref 19–32)
Calcium: 9.2 mg/dL (ref 8.4–10.5)
Chloride: 105 mEq/L (ref 96–112)
Creat: 0.47 mg/dL — ABNORMAL LOW (ref 0.50–1.10)
Glucose, Bld: 67 mg/dL — ABNORMAL LOW (ref 70–99)
Potassium: 4 mEq/L (ref 3.5–5.3)
Sodium: 140 mEq/L (ref 135–145)
Total Bilirubin: 0.4 mg/dL (ref 0.3–1.2)
Total Protein: 6.9 g/dL (ref 6.0–8.3)

## 2011-06-23 LAB — GLUCOSE, POCT (MANUAL RESULT ENTRY): POC Glucose: 60

## 2011-06-23 LAB — TSH: TSH: 0.477 u[IU]/mL (ref 0.350–4.500)

## 2011-06-23 NOTE — Progress Notes (Signed)
Subjective: 41 year old female who works at a [employer] and is the mother of 2. Started feeling bad on Friday. Excessive fatigue. Right side and hand discomfort, thought she might be getting a sinus infection. She just felt extremely tired and sleepy. She went to the store and got some medicine for her sinuses. Driving home she fell sleep but that we'll in the oncoming lane woke up when her children woke her up. Continued to be alert be excessively lethargic over the weekend. She did go to work today, having worked yesterday, but left work early because of her tiredness. She has had a sore throat and some cough. No fever. He has not specifically been dizzy but just doesn't feel right.  Objective: Alert and oriented. TMs are normal. Eyes PERRLA. Throat clear. Neck supple without significant nodes. Chest is clear to auscultation. Heart regular without murmurs. Romberg is negative.  Assessment: Nonspecific malaise Sore throat Cough  Plan: Check CBC and glucose and strep and proceed from there   Results for orders placed in visit on 06/23/11  POCT CBC      Component Value Range   WBC 8.2  4.6 - 10.2 (K/uL)   Lymph, poc 2.0  0.6 - 3.4    POC LYMPH PERCENT 24.0  10 - 50 (%L)   MID (cbc) 0.4  0 - 0.9    POC MID % 5.3  0 - 12 (%M)   POC Granulocyte 5.8  2 - 6.9    Granulocyte percent 70.7  37 - 80 (%G)   RBC 4.90  4.04 - 5.48 (M/uL)   Hemoglobin 12.5  12.2 - 16.2 (g/dL)   HCT, POC 16.1  09.6 - 47.9 (%)   MCV 80.5  80 - 97 (fL)   MCH, POC 25.5 (*) 27 - 31.2 (pg)   MCHC 31.7 (*) 31.8 - 35.4 (g/dL)   RDW, POC 04.5     Platelet Count, POC 333  142 - 424 (K/uL)   MPV 8.0  0 - 99.8 (fL)  GLUCOSE, POCT (MANUAL RESULT ENTRY)      Component Value Range   POC Glucose 60    POCT RAPID STREP A (OFFICE)      Component Value Range   Rapid Strep A Screen Negative  Negative   POCT GLYCOSYLATED HEMOGLOBIN (HGB A1C)      Component Value Range   Hemoglobin A1C 5.7     Patient was  hypoglycemic. I am not sure why. Advise increasing protein in her diet and eating a snack if she gets feeling weak. If this keeps up we'll have to refer her to an endocrinologist.

## 2011-06-23 NOTE — Patient Instructions (Signed)
Increase protein in diet. If he gets feeling weak when operating machinery or driving stop the activity. Have something on hand to be if he gets a feeling that degree of weak. Plenty of rest. If you continue to feel extremely fatigued and bad come back for reassessment. I will know the results of the labs.

## 2011-08-13 ENCOUNTER — Ambulatory Visit (INDEPENDENT_AMBULATORY_CARE_PROVIDER_SITE_OTHER): Payer: BC Managed Care – PPO | Admitting: Internal Medicine

## 2011-08-13 VITALS — BP 102/64 | HR 94 | Temp 99.1°F | Resp 20 | Ht 63.0 in | Wt 154.0 lb

## 2011-08-13 DIAGNOSIS — N390 Urinary tract infection, site not specified: Secondary | ICD-10-CM

## 2011-08-13 DIAGNOSIS — K529 Noninfective gastroenteritis and colitis, unspecified: Secondary | ICD-10-CM

## 2011-08-13 DIAGNOSIS — K5289 Other specified noninfective gastroenteritis and colitis: Secondary | ICD-10-CM

## 2011-08-13 DIAGNOSIS — J329 Chronic sinusitis, unspecified: Secondary | ICD-10-CM

## 2011-08-13 DIAGNOSIS — J321 Chronic frontal sinusitis: Secondary | ICD-10-CM

## 2011-08-13 MED ORDER — AMOXICILLIN 500 MG PO CAPS: 1000.0000 mg | ORAL_CAPSULE | Freq: Two times a day (BID) | ORAL | Status: AC

## 2011-08-13 NOTE — Progress Notes (Signed)
  Subjective:    Patient ID: Dana Rojas, female    DOB: [birth year], 41 y.o.   MRN: [id number]  HPI Has diarrhea no vomiting for 2 days x10 per day, minimal cramping. Has sinusitis left max with green discharge 2d Has mild burning on urination 1d.   Review of Systems See problem list    Objective:   Physical Exam R max tenderness, purulent rhinorrhea Lungs clear Abd soft, bs present, mild tenderness.       Assessment & Plan:  Gastroenteritis Sinusitis Uti

## 2011-08-13 NOTE — Patient Instructions (Addendum)
Norovirus Infection Norovirus illness is caused by a viral infection. The term norovirus refers to a group of viruses. Any of those viruses can cause norovirus illness. This illness is often referred to by other names such as viral gastroenteritis, stomach flu, and food poisoning. Anyone can get a norovirus infection. People can have the illness multiple times during their lifetime. CAUSES  Norovirus is found in the stool or vomit of infected people. It is easily spread from person to person (contagious). People with norovirus are contagious from the moment they begin feeling ill. They may remain contagious for as long as 3 days to 2 weeks after recovery. People can become infected with the virus in several ways. This includes:  Eating food or drinking liquids that are contaminated with norovirus.   Touching surfaces or objects contaminated with norovirus, and then placing your hand in your mouth.   Having direct contact with a person who is infected and shows symptoms. This may occur while caring for someone with illness or while sharing foods or eating utensils with someone who is ill.  SYMPTOMS  Symptoms usually begin 1 to 2 days after ingestion of the virus. Symptoms may include:  Nausea.   Vomiting.   Diarrhea.   Stomach cramps.   Low-grade fever.   Chills.   Headache.   Muscle aches.   Tiredness.  Most people with norovirus illness get better within 1 to 2 days. Some people become dehydrated because they cannot drink enough liquids to replace those lost from vomiting and diarrhea. This is especially true for young children, the elderly, and others who are unable to care for themselves. DIAGNOSIS  Diagnosis is based on your symptoms and exam. Currently, only state public health laboratories have the ability to test for norovirus in stool or vomit. TREATMENT  No specific treatment exists for norovirus infections. No vaccine is available to prevent infections. Norovirus illness  is usually brief in healthy people. If you are ill with vomiting and diarrhea, you should drink enough water and fluids to keep your urine clear or pale yellow. Dehydration is the most serious health effect that can result from this infection. By drinking oral rehydration solution (ORS), people can reduce their chance of becoming dehydrated. There are many commercially available pre-made and powdered ORS designed to safely rehydrate people. These may be recommended by your caregiver. Replace any new fluid losses from diarrhea or vomiting with ORS as follows:  If your child weighs 10 kg or less (22 lb or less), give 60 to 120 ml ( to  cup or 2 to 4 oz) of ORS for each diarrheal stool or vomiting episode.   If your child weighs more than 10 kg (more than 22 lb), give 120 to 240 ml ( to 1 cup or 4 to 8 oz) of ORS for each diarrheal stool or vomiting episode.  HOME CARE INSTRUCTIONS   Follow all your caregiver's instructions.   Avoid sugar-free and alcoholic drinks while ill.   Only take over-the-counter or prescription medicines for pain, vomiting, diarrhea, or fever as directed by your caregiver.  You can decrease your chances of coming in contact with norovirus or spreading it by following these steps:  Frequently wash your hands, especially after using the toilet, changing diapers, and before eating or preparing food.   Carefully wash fruits and vegetables. Cook shellfish before eating them.   Do not prepare food for others while you are infected and for at least 3 days after recovering from illness.     Thoroughly clean and disinfect contaminated surfaces immediately after an episode of illness using a bleach-based household cleaner.   Immediately remove and wash clothing or linens that may be contaminated with the virus.   Use the toilet to dispose of any vomit or stool. Make sure the surrounding area is kept clean.   Food that may have been contaminated by an ill person should be  discarded.  SEEK IMMEDIATE MEDICAL CARE IF:   You develop symptoms of dehydration that do not improve with fluid replacement. This may include:   Excessive sleepiness.   Lack of tears.   Dry mouth.   Dizziness when standing.   Weak pulse.  Document Released: 05/02/2002 Document Revised: 01/29/2011 Document Reviewed: 06/03/2009 [facility] Patient Information 2012 [place], [place].Sinusitis Sinuses are air pockets within the bones of your face. The growth of bacteria within a sinus leads to infection. The infection prevents the sinuses from draining. This infection is called sinusitis. SYMPTOMS  There will be different areas of pain depending on which sinuses have become infected.  The maxillary sinuses often produce pain beneath the eyes.   Frontal sinusitis may cause pain in the middle of the forehead and above the eyes.  Other problems (symptoms) include:  Toothaches.   Colored, pus-like (purulent) drainage from the nose.   Swelling, warmth, and tenderness over the sinus areas may be signs of infection.  TREATMENT  Sinusitis is most often determined by an exam.X-rays may be taken. If x-rays have been taken, make sure you obtain your results or find out how you are to obtain them. Your caregiver may give you medications (antibiotics). These are medications that will help kill the bacteria causing the infection. You may also be given a medication (decongestant) that helps to reduce sinus swelling.  HOME CARE INSTRUCTIONS   Only take over-the-counter or prescription medicines for pain, discomfort, or fever as directed by your caregiver.   Drink extra fluids. Fluids help thin the mucus so your sinuses can drain more easily.   Applying either moist heat or ice packs to the sinus areas may help relieve discomfort.   Use saline nasal sprays to help moisten your sinuses. The sprays can be found at your local drugstore.  SEEK IMMEDIATE MEDICAL CARE IF:  You have a fever.   You have  increasing pain, severe headaches, or toothache.   You have nausea, vomiting, or drowsiness.   You develop unusual swelling around the face or trouble seeing.  MAKE SURE YOU:   Understand these instructions.   Will watch your condition.   Will get help right away if you are not doing well or get worse.  Document Released: 02/09/2005 Document Revised: 01/29/2011 Document Reviewed: 09/08/2006 [facility] Patient Information 2012 [place], [place].Urinary Tract Infection Infections of the urinary tract can start in several places. A bladder infection (cystitis), a kidney infection (pyelonephritis), and a prostate infection (prostatitis) are different types of urinary tract infections (UTIs). They usually get better if treated with medicines (antibiotics) that kill germs. Take all the medicine until it is gone. You or your child may feel better in a few days, but TAKE ALL MEDICINE or the infection may not respond and may become more difficult to treat. HOME CARE INSTRUCTIONS   Drink enough water and fluids to keep the urine clear or pale yellow. Cranberry juice is especially recommended, in addition to large amounts of water.   Avoid caffeine, tea, and carbonated beverages. They tend to irritate the bladder.   Alcohol may irritate the prostate.  Only take over-the-counter or prescription medicines for pain, discomfort, or fever as directed by your caregiver.  To prevent further infections:  Empty the bladder often. Avoid holding urine for long periods of time.   After a bowel movement, women should cleanse from front to back. Use each tissue only once.   Empty the bladder before and after sexual intercourse.  FINDING OUT THE RESULTS OF YOUR TEST Not all test results are available during your visit. If your or your child's test results are not back during the visit, make an appointment with your caregiver to find out the results. Do not assume everything is normal if you have not heard from  your caregiver or the medical facility. It is important for you to follow up on all test results. SEEK MEDICAL CARE IF:   There is back pain.   Your baby is older than 3 months with a rectal temperature of 100.5 F (38.1 C) or higher for more than 1 day.   Your or your child's problems (symptoms) are no better in 3 days. Return sooner if you or your child is getting worse.  SEEK IMMEDIATE MEDICAL CARE IF:   There is severe back pain or lower abdominal pain.   You or your child develops chills.   You have a fever.   Your baby is older than 3 months with a rectal temperature of 102 F (38.9 C) or higher.   Your baby is 56 months old or younger with a rectal temperature of 100.4 F (38 C) or higher.   There is nausea or vomiting.   There is continued burning or discomfort with urination.  MAKE SURE YOU:   Understand these instructions.   Will watch your condition.   Will get help right away if you are not doing well or get worse.  Document Released: 11/19/2004 Document Revised: 01/29/2011 Document Reviewed: 06/24/2006 [facility] Patient Information 2012 [place], [place].

## 2011-08-14 ENCOUNTER — Telehealth: Payer: Self-pay

## 2011-08-14 NOTE — Telephone Encounter (Signed)
Pt is needing Dr note for today and also would like to know if there is something else she can take along with the amoxicillin for drainage.

## 2011-08-17 MED ORDER — IPRATROPIUM BROMIDE 0.03 % NA SOLN: 2.0000 | Freq: Two times a day (BID) | NASAL | Status: DC

## 2011-08-17 NOTE — Telephone Encounter (Signed)
Sending in Rx for Atrovent to [facility] and notified pt on VM

## 2011-08-17 NOTE — Telephone Encounter (Signed)
Atrovent NS if she would like. Can send in order

## 2012-03-09 ENCOUNTER — Other Ambulatory Visit: Payer: Self-pay | Admitting: Family Medicine

## 2012-03-09 DIAGNOSIS — Z1231 Encounter for screening mammogram for malignant neoplasm of breast: Secondary | ICD-10-CM

## 2012-03-30 ENCOUNTER — Ambulatory Visit
Admission: RE | Admit: 2012-03-30 | Discharge: 2012-03-30 | Disposition: A | Discharge status: Home / Self Care | Payer: BC Managed Care – PPO | Source: Ambulatory Visit | Attending: Family Medicine | Admitting: Family Medicine

## 2012-03-30 DIAGNOSIS — Z1231 Encounter for screening mammogram for malignant neoplasm of breast: Secondary | ICD-10-CM

## 2012-05-23 ENCOUNTER — Encounter (HOSPITAL_BASED_OUTPATIENT_CLINIC_OR_DEPARTMENT_OTHER): Payer: Self-pay | Admitting: *Deleted

## 2012-05-23 ENCOUNTER — Emergency Department (HOSPITAL_BASED_OUTPATIENT_CLINIC_OR_DEPARTMENT_OTHER)
Admission: EM | Admit: 2012-05-23 | Discharge: 2012-05-23 | Disposition: A | Discharge status: Home / Self Care | Payer: BC Managed Care – PPO | Attending: Emergency Medicine | Admitting: Emergency Medicine

## 2012-05-23 ENCOUNTER — Emergency Department (HOSPITAL_BASED_OUTPATIENT_CLINIC_OR_DEPARTMENT_OTHER): Payer: BC Managed Care – PPO

## 2012-05-23 DIAGNOSIS — R5383 Other fatigue: Secondary | ICD-10-CM | POA: Insufficient documentation

## 2012-05-23 DIAGNOSIS — R5381 Other malaise: Secondary | ICD-10-CM | POA: Insufficient documentation

## 2012-05-23 DIAGNOSIS — R42 Dizziness and giddiness: Secondary | ICD-10-CM | POA: Insufficient documentation

## 2012-05-23 DIAGNOSIS — R0989 Other specified symptoms and signs involving the circulatory and respiratory systems: Secondary | ICD-10-CM | POA: Insufficient documentation

## 2012-05-23 DIAGNOSIS — Z79899 Other long term (current) drug therapy: Secondary | ICD-10-CM | POA: Insufficient documentation

## 2012-05-23 DIAGNOSIS — B9789 Other viral agents as the cause of diseases classified elsewhere: Secondary | ICD-10-CM | POA: Insufficient documentation

## 2012-05-23 DIAGNOSIS — R509 Fever, unspecified: Secondary | ICD-10-CM | POA: Insufficient documentation

## 2012-05-23 DIAGNOSIS — R0602 Shortness of breath: Secondary | ICD-10-CM | POA: Insufficient documentation

## 2012-05-23 DIAGNOSIS — B349 Viral infection, unspecified: Secondary | ICD-10-CM

## 2012-05-23 NOTE — ED Provider Notes (Signed)
History     CSN: [id number]  Arrival date & time 05/23/12  1345   First MD Initiated Contact with Patient 05/23/12 1349      Chief Complaint  Patient presents with  . Chest Pain    (Consider location/radiation/quality/duration/timing/severity/associated sxs/prior treatment) Patient is a 42 y.o. female presenting with cough.  Cough  Pt reports over a week of cough, SOB, chest soreness and subjective fever. Seen at PCP office a week ago for same. Given Rx for Prednisone, Levaquin for presumed sinusitis but no CXR done to check for Pneumonia. She reports symptoms have been persistent. Has also had general malaise, dizziness and increased sleepiness. No vomiting or diarrhea. No change in appetite.  Past Medical History  Diagnosis Date  . Allergy     Past Surgical History  Procedure Laterality Date  . Hernia repair      No family history on file.  History  Substance Use Topics  . Smoking status: Never Smoker   . Smokeless tobacco: Not on file  . Alcohol Use: No    OB History   Grav Para Term Preterm Abortions TAB SAB Ect Mult Living                  Review of Systems  Respiratory: Positive for cough.    All other systems reviewed and are negative except as noted in HPI.   Allergies  Review of patient's allergies indicates no known allergies.  Home Medications   Current Outpatient Rx  Name  Route  Sig  Dispense  Refill  . ipratropium (ATROVENT) 0.03 % nasal spray   Nasal   Place 2 sprays into the nose 2 (two) times daily.   30 mL   0   . loratadine (CLARITIN) 10 MG tablet   Oral   Take 10 mg by mouth daily.         . pseudoephedrine-acetaminophen (TYLENOL SINUS) 30-500 MG TABS   Oral   Take 1 tablet by mouth every 4 (four) hours as needed.           BP 114/56  Pulse 64  Temp(Src) 98.2 F (36.8 C) (Oral)  Resp 20  Wt 170 lb (77.111 kg)  BMI 30.12 kg/m2  SpO2 97%  Physical Exam  Nursing note and vitals reviewed. Constitutional: She is  oriented to person, place, and time. She appears well-developed and well-nourished.  HENT:  Head: Normocephalic and atraumatic.  Eyes: EOM are normal. Pupils are equal, round, and reactive to light.  Neck: Normal range of motion. Neck supple.  Cardiovascular: Normal rate, normal heart sounds and intact distal pulses.   Pulmonary/Chest: Effort normal and breath sounds normal.  Abdominal: Bowel sounds are normal. She exhibits no distension. There is no tenderness.  Musculoskeletal: Normal range of motion. She exhibits no edema and no tenderness.  Neurological: She is alert and oriented to person, place, and time. She has normal strength. No cranial nerve deficit or sensory deficit.  Skin: Skin is warm and dry. No rash noted.  Psychiatric: She has a normal mood and affect.    ED Course  Procedures (including critical care time)  Labs Reviewed - No data to display Dg Chest 2 View  05/23/2012  *RADIOLOGY REPORT*  Clinical Data: 42 year old female with pneumonia in January.  Chest pain times 3 weeks.  CHEST - 2 VIEW  Comparison: 03/09/2012 and earlier.  Findings: Interval mildly improved lung volumes, normal.  Cardiac size and mediastinal contours are within normal limits.  Visualized tracheal  air column is within normal limits.  The lungs are clear. No pneumothorax or effusion. No acute osseous abnormality identified.  IMPRESSION: Negative, no acute cardiopulmonary abnormality.   Original Report Authenticated By: Erskine Speed, M.D.      No diagnosis found.    MDM  Vitals and exam are unremarkable. Will check CXR for possible pneumonia while on Levaquin (she is on day 8/10). Suspect that this is a viral process and hence no improvement with Abx or steroids. Also check orthostatics due to complaining of dizziness. No concern for life-threatening cardiac or pulmonary process such as ACS or PE.   2:55 PM Pt not orthostatic, CXR is clear. Advised to continue meds, drink plenty of fluids and  followup with PCP if symptoms not improved.      Charles B. Bernette Mayers, MD 05/23/12 [id number]

## 2012-05-23 NOTE — ED Notes (Signed)
Cough and chest pain. Body aches, sleeping a lot. Was seen by her MD a week ago and started on antibiotics for sinusitis. Does not feel better.

## 2012-07-01 ENCOUNTER — Other Ambulatory Visit (HOSPITAL_COMMUNITY)
Admission: RE | Admit: 2012-07-01 | Discharge: 2012-07-01 | Disposition: A | Discharge status: Home / Self Care | Payer: BC Managed Care – PPO | Source: Ambulatory Visit | Attending: Gynecology | Admitting: Gynecology

## 2012-07-01 ENCOUNTER — Encounter: Payer: Self-pay | Admitting: Gynecology

## 2012-07-01 ENCOUNTER — Ambulatory Visit (INDEPENDENT_AMBULATORY_CARE_PROVIDER_SITE_OTHER): Payer: BC Managed Care – PPO | Admitting: Gynecology

## 2012-07-01 VITALS — BP 116/70 | Ht 62.0 in | Wt 173.0 lb

## 2012-07-01 DIAGNOSIS — Z01419 Encounter for gynecological examination (general) (routine) without abnormal findings: Secondary | ICD-10-CM | POA: Insufficient documentation

## 2012-07-01 DIAGNOSIS — N920 Excessive and frequent menstruation with regular cycle: Secondary | ICD-10-CM

## 2012-07-01 DIAGNOSIS — Z1151 Encounter for screening for human papillomavirus (HPV): Secondary | ICD-10-CM | POA: Insufficient documentation

## 2012-07-01 DIAGNOSIS — N946 Dysmenorrhea, unspecified: Secondary | ICD-10-CM

## 2012-07-01 NOTE — Progress Notes (Signed)
KARALINE BURESH 06-Oct-1970 [id number]   History:    42 y.o.  for annual gyn exam who has not been seen the office since 2009. At time of patient's second cesarean section in 2003 she had bilateral tubal sterilization procedure. She is complaining of heavy periods whereby the first 3-4 days she would have to use a tampon and sanitary napkins and be changing hourly. She also describes passing large blood clots. Along with discomfort. Her total days of bleeding approximately 7 days. She states her last Pap smear was over 3 years ago. She denies any prior history of abnormal Pap smears. She freely does result this examination. She had a normal mammogram this year. She stated she had her Tdap vaccine in 2011. Patient with past history of recurrent right angle hernia x2 had mesh placed on last inguinal herniorrhaphy in [facility].  Past medical history,surgical history, family history and social history were all reviewed and documented in the EPIC chart.  Gynecologic History Patient's last menstrual period was 06/13/2012. Contraception: tubal ligation Last Pap: greater than 3 years ago. Results were: normal Last mammogram: 2014. Results were: normal  Obstetric History OB History   Grav Para Term Preterm Abortions TAB SAB Ect Mult Living   2 2        2      # Outc Date GA Lbr Len/2nd Wgt Sex Del Anes PTL Lv   1 PAR            2 PAR                ROS: A ROS was performed and pertinent positives and negatives are included in the history.  GENERAL: No fevers or chills. HEENT: No change in vision, no earache, sore throat or sinus congestion. NECK: No pain or stiffness. CARDIOVASCULAR: No chest pain or pressure. No palpitations. PULMONARY: No shortness of breath, cough or wheeze. GASTROINTESTINAL: No abdominal pain, nausea, vomiting or diarrhea, melena or bright red blood per rectum. GENITOURINARY: No urinary frequency, urgency, hesitancy or dysuria. MUSCULOSKELETAL: No joint or  muscle pain, no back pain, no recent trauma. DERMATOLOGIC: No rash, no itching, no lesions. ENDOCRINE: No polyuria, polydipsia, no heat or cold intolerance. No recent change in weight. HEMATOLOGICAL: No anemia or easy bruising or bleeding. NEUROLOGIC: No headache, seizures, numbness, tingling or weakness. PSYCHIATRIC: No depression, no loss of interest in normal activity or change in sleep pattern.     Exam: chaperone present  BP 116/70  Ht 5\' 2"  (1.575 m)  Wt 173 lb (78.472 kg)  BMI 31.63 kg/m2  LMP 06/13/2012  Body mass index is 31.63 kg/(m^2).  General appearance : Well developed well nourished female. No acute distress HEENT: Neck supple, trachea midline, no carotid bruits, no thyroidmegaly Lungs: Clear to auscultation, no rhonchi or wheezes, or rib retractions  Heart: Regular rate and rhythm, no murmurs or gallops Breast:Examined in sitting and supine position were symmetrical in appearance, no palpable masses or tenderness,  no skin retraction, no nipple inversion, no nipple discharge, no skin discoloration, no axillary or supraclavicular lymphadenopathy Abdomen: no palpable masses or tenderness, no rebound or guarding Extremities: no edema or skin discoloration or tenderness  Pelvic:  Bartholin, Urethra, Skene Glands: Within normal limits             Vagina: No gross lesions or discharge  Cervix: No gross lesions or discharge  Uterus  anteverted, normal size, shape and consistency, non-tender and mobile  Adnexa  Without masses or tenderness  Anus  and perineum  normal   Rectovaginal  normal sphincter tone without palpated masses or tenderness             Hemoccult not indicated     Assessment/Plan:  42 y.o. female for annual exam with complaints of dysmenorrhea and menorrhagia. We discussed different treatment options to include endometrial ablation (her option technique) versus Mirena IUD versus total laparoscopic hysterectomy. Literature information was provided.we will  check with her insurance company and plan accordingly. If they do not cover any the above 3 we will proceed with prescribe her the Lysteda 650 mg 2 tablets 3 times a day for 5 days for her menorrhagia. Her primary physician has been doing her lab work. Her Pap smear was done today. The new screening guidelines were discussed. We discussed importance of monthly self breast examination.    Ok Edwards MD, 8:05 PM 07/01/2012

## 2012-07-01 NOTE — Patient Instructions (Addendum)
Total Laparoscopic Hysterectomy A total laparoscopic hysterectomy is a minimally invasive surgery to remove your uterus and cervix. This surgery is performed by making several small cuts (incisions) in your abdomen. It can also be done with a thin, lighted tube (laparoscope) inserted into 2 small incisions in the lower abdomen. Your fallopian tubes and ovaries can be removed (bilateral salpingo-oopherectomy) during this surgery as well.If a total laparoscopic hysterectomy is started and it is not safe to continue, the laparoscopic surgery will be converted to an open abdominal surgery. You will not have menstrual periods or be able to get pregnant after having this surgery. If a bilateral salpingo-oopherectomy was performed before menopause, you will go through a sudden (abrupt) menopause. This can be helped with hormone medicines. Benefits of minimally invasive surgery include:  Less pain.  Less risk of blood loss.  Less risk of infection.  Quicker return to normal activities.  Usually a 1 night stay in the hospital.  Overall patient satisfaction. LET YOUR CAREGIVER KNOW ABOUT:  Any history of abnormal Pap tests.  Allergies to food or medicine.  Medicines taken, including vitamins, herbs, eyedrops, over-the-counter medicines, and creams.  Use of steroids (by mouth or creams).  Previous problems with anesthetics or numbing medicines.  History of bleeding problems or blood clots.  Previous surgery.  Other health problems, including diabetes and kidney problems.  Desire for future fertility.  Any infections or colds you may have developed.  Symptoms of irregular or heavy periods, weight loss, or urinary or bowel changes. RISKS AND COMPLICATIONS   Bleeding.  Blood clots in the legs or lung.  Infection.  Injury to surrounding organs.  Problems with anesthesia.  Early menopause symptoms (hot flashes, night sweats, insomnia).  Risk of conversion to an open abdominal  incision. BEFORE THE PROCEDURE  Ask your caregiver about changing or stopping your regular medicines.  Do not take aspirin or blood thinners (anticoagulants) for 1 week before the surgery, or as told by your caregiver.  Do not eat or drink anything for 8 hours before the surgery, or as told by your caregiver.  Quit smoking if you smoke.  Arrange for a ride home after surgery and for someone to help you at home during recovery. PROCEDURE   You will be given antibiotic medicine.  An intravenous (IV) line will be placed in your arm. You will be given medicine to make you sleep (general anesthetic).  A gas (carbon dioxide) will be used to inflate your abdomen. This will allow your surgeon to look inside your abdomen, perform your surgery, and treat any other problems found if necessary.  Three or four small incisions (often less than  inch) will be made in your abdomen. One of these incisions will be made in the area of your belly button (navel). The laparoscope will be inserted into the incision. Your surgeon will look through the laparoscope while doing your procedure.  Other surgical instruments will be inserted through the other incisions.  The uterus may be removed through the vagina or cut into small pieces and removed through the small incisions.  Your incisions will be closed. AFTER THE PROCEDURE  The gas will be released from inside your abdomen.  You will be taken to the recovery area where a nurse will watch and check your progress. Once you are awake, stable, and taking fluids well, without other problems, you will return to your room or be allowed to go home.  There is usually minimal discomfort following the surgery because  the incisions are so small.  You will be given pain medicine while you are in the hospital and for when you go home.  Try to have someone with you the first 3 to 5 days after you go home.  Follow up with your surgeon in 2 to 4 weeks after surgery  to evaluate your progress. Document Released: 12/07/2006 Document Revised: 05/04/2011 Document Reviewed: 09/26/2010 [facility] Patient Information 2013 [place],

## 2012-07-13 ENCOUNTER — Telehealth: Payer: Self-pay

## 2012-07-13 ENCOUNTER — Encounter: Payer: Self-pay | Admitting: Gynecology

## 2012-07-13 NOTE — Telephone Encounter (Signed)
Dr. Glenetta Hew had asked me to check insurance benefits for patient for Her Option, Mirena and TLH.  I did check benefits and called patient with the info. She will consider choices and call me when ready to schedule.

## 2012-12-15 ENCOUNTER — Other Ambulatory Visit: Payer: Self-pay | Admitting: Family Medicine

## 2012-12-15 DIAGNOSIS — R1030 Lower abdominal pain, unspecified: Secondary | ICD-10-CM

## 2012-12-19 ENCOUNTER — Ambulatory Visit
Admission: RE | Admit: 2012-12-19 | Discharge: 2012-12-19 | Disposition: A | Discharge status: Home / Self Care | Payer: BC Managed Care – PPO | Source: Ambulatory Visit | Attending: Family Medicine | Admitting: Family Medicine

## 2012-12-19 DIAGNOSIS — R1030 Lower abdominal pain, unspecified: Secondary | ICD-10-CM

## 2012-12-26 ENCOUNTER — Other Ambulatory Visit: Payer: Self-pay | Admitting: Family Medicine

## 2012-12-26 DIAGNOSIS — R1030 Lower abdominal pain, unspecified: Secondary | ICD-10-CM

## 2013-01-02 ENCOUNTER — Inpatient Hospital Stay: Admission: RE | Admit: 2013-01-02 | Payer: BC Managed Care – PPO | Source: Ambulatory Visit

## 2013-12-25 ENCOUNTER — Encounter: Payer: Self-pay | Admitting: Gynecology

## 2014-01-07 ENCOUNTER — Ambulatory Visit (INDEPENDENT_AMBULATORY_CARE_PROVIDER_SITE_OTHER): Payer: BC Managed Care – PPO | Admitting: Physician Assistant

## 2014-01-07 VITALS — BP 108/64 | HR 79 | Temp 98.3°F | Resp 16 | Ht 64.0 in | Wt 171.0 lb

## 2014-01-07 DIAGNOSIS — R059 Cough, unspecified: Secondary | ICD-10-CM

## 2014-01-07 DIAGNOSIS — J988 Other specified respiratory disorders: Secondary | ICD-10-CM

## 2014-01-07 DIAGNOSIS — R05 Cough: Secondary | ICD-10-CM

## 2014-01-07 MED ORDER — CLARITHROMYCIN 250 MG/5ML PO SUSR: 500.0000 mg | Freq: Two times a day (BID) | ORAL | Status: DC

## 2014-01-07 MED ORDER — BENZONATATE 100 MG PO CAPS: 100.0000 mg | ORAL_CAPSULE | Freq: Three times a day (TID) | ORAL | Status: DC | PRN

## 2014-01-07 NOTE — Progress Notes (Signed)
Subjective:    Patient ID: Dana Rojas, female    DOB: 25-May-1970, 43 y.o.   MRN: [id number]  HPI Patient presents with 2 weeks of productive cough and sore throat. Cough has caused both HA and CP. Is now also haivng congestion of nares and chest. Endorses rhinorrhea, sinus pain, and myalgias. Denies fever, neck pain, and SOB. H/o of seasonal allergies, but not asthma. Has used inhaler, alka seltzer cold, robitussin, and Theraflu with minimal to moderate relief. Does not recall sick contacts.  Does not take pills well and would prefer meds in form of liquid.    Review of Systems  Constitutional: Negative for fever, chills and appetite change.  HENT: Positive for congestion, rhinorrhea, sinus pressure and sore throat. Negative for ear discharge, ear pain, postnasal drip and sneezing.   Eyes: Positive for pain (pressure). Negative for discharge and itching.  Respiratory: Positive for cough (productive). Negative for chest tightness, shortness of breath and wheezing.   Cardiovascular: Positive for chest pain (secondary to cough).  Gastrointestinal: Negative for nausea and vomiting.  Musculoskeletal: Positive for myalgias (intermitent). Negative for neck pain and neck stiffness.  Allergic/Immunologic: Negative for food allergies.  Neurological: Positive for headaches. Negative for dizziness and light-headedness.  Hematological: Negative for adenopathy.       Objective:   Physical Exam  Constitutional: She is oriented to person, place, and time. She appears well-developed and well-nourished. No distress.  Blood pressure 108/64, pulse 79, temperature 98.3 F (36.8 C), temperature source Oral, resp. rate 16, height 5\' 4"  (1.626 m), weight 171 lb (77.565 kg), last menstrual period 12/11/2013, SpO2 98 %.   HENT:  Head: Normocephalic and atraumatic.  Right Ear: Tympanic membrane, external ear and ear canal normal.  Left Ear: Tympanic membrane, external ear and ear canal normal.  Nose:  Mucosal edema, rhinorrhea (with erythema) and sinus tenderness present. Right sinus exhibits maxillary sinus tenderness. Right sinus exhibits no frontal sinus tenderness. Left sinus exhibits maxillary sinus tenderness. Left sinus exhibits no frontal sinus tenderness.  Mouth/Throat: Uvula is midline and mucous membranes are normal. Posterior oropharyngeal edema and posterior oropharyngeal erythema present. No oropharyngeal exudate.  Eyes: Conjunctivae are normal. Pupils are equal, round, and reactive to light. Right eye exhibits no discharge. Left eye exhibits no discharge. No scleral icterus.  Neck: Normal range of motion. Neck supple. No thyromegaly present.  Cardiovascular: Normal rate, regular rhythm and normal heart sounds.  Exam reveals no gallop and no friction rub.   No murmur heard. Pulmonary/Chest: Effort normal. No respiratory distress. She has no decreased breath sounds. She has no wheezes. She has no rhonchi. She has no rales. She exhibits no tenderness.  Abdominal: Soft. Bowel sounds are normal. She exhibits no mass. There is no tenderness.  Lymphadenopathy:    She has cervical adenopathy.  Neurological: She is alert and oriented to person, place, and time.  Skin: Skin is warm and dry. No rash noted. She is not diaphoretic. No erythema. No pallor.          Assessment & Plan:  1. Respiratory tract infection 2. Cough - clarithromycin (BIAXIN) 250 MG/5ML suspension; Take 10 mLs (500 mg total) by mouth 2 (two) times daily.  Dispense: 200 mL; Refill: 0 - benzonatate (TESSALON) 100 MG capsule; Take 1-2 capsules (100-200 mg total) by mouth 3 (three) times daily as needed for cough.  Dispense: 40 capsule; Refill: 0 -Plenty of water and fluids.   Alveta Heimlich PA-C  [facility] 01/07/2014 2:53 PM

## 2014-01-07 NOTE — Patient Instructions (Signed)
In addition to prescribed medication, also restart atrovent nasal spray and get plenty of fluids and rest.

## 2014-01-08 ENCOUNTER — Other Ambulatory Visit (INDEPENDENT_AMBULATORY_CARE_PROVIDER_SITE_OTHER): Payer: Self-pay

## 2014-01-08 DIAGNOSIS — R1013 Epigastric pain: Secondary | ICD-10-CM

## 2014-01-10 ENCOUNTER — Ambulatory Visit
Admission: RE | Admit: 2014-01-10 | Discharge: 2014-01-10 | Disposition: A | Discharge status: Home / Self Care | Payer: BC Managed Care – PPO | Source: Ambulatory Visit | Attending: General Surgery | Admitting: General Surgery

## 2014-01-10 DIAGNOSIS — R1013 Epigastric pain: Secondary | ICD-10-CM

## 2014-01-10 MED ORDER — IOHEXOL 300 MG/ML  SOLN: 100.0000 mL | Freq: Once | INTRAMUSCULAR | Status: AC | PRN

## 2014-01-10 NOTE — Progress Notes (Signed)
I have discussed this case with Ms. Ricki Miller, PA-C and agree.

## 2014-01-12 ENCOUNTER — Other Ambulatory Visit: Payer: Self-pay | Admitting: Gynecology

## 2014-01-12 ENCOUNTER — Telehealth: Payer: Self-pay

## 2014-01-12 DIAGNOSIS — D259 Leiomyoma of uterus, unspecified: Secondary | ICD-10-CM

## 2014-01-12 NOTE — Telephone Encounter (Signed)
Patient called stating she had CT yesterday and they noted she had fibroids and she wanted to know what she needed to do.  Of note, she is overdue for CE last being seen last 06/2012.  Rec?  "Reproductive: The uterus demonstrates multiple small fibroids. Both ovaries appear normal. No pelvic mass, adenopathy or free pelvic fluid collections. No inguinal mass or hernia. Bilateral paramidline vaginal hernias are noted

## 2014-01-12 NOTE — Telephone Encounter (Signed)
Patient informed.  Order placed. Appt scheduled.

## 2014-01-12 NOTE — Telephone Encounter (Signed)
Office visit with ultrasound

## 2014-01-15 ENCOUNTER — Telehealth (INDEPENDENT_AMBULATORY_CARE_PROVIDER_SITE_OTHER): Payer: Self-pay

## 2014-01-15 NOTE — Telephone Encounter (Signed)
Pt calling again for direction after her recent CT scan.  Results given, but not in detail.  Dr. Rosendo Gros will need to review. Please call.

## 2014-01-15 NOTE — Telephone Encounter (Signed)
Called and spoke to patient to make aware that Dr. Rosendo Gros will be referring patient to see Dr. Macy Mis in [place] for management of hernia.  Patient verbalized understanding and will await a call from our office with appointment date & time.  Referral placed in Jennifer's tray.

## 2014-02-02 ENCOUNTER — Other Ambulatory Visit: Payer: Self-pay | Admitting: Gynecology

## 2014-02-02 ENCOUNTER — Ambulatory Visit (INDEPENDENT_AMBULATORY_CARE_PROVIDER_SITE_OTHER): Payer: BC Managed Care – PPO

## 2014-02-02 ENCOUNTER — Ambulatory Visit (INDEPENDENT_AMBULATORY_CARE_PROVIDER_SITE_OTHER): Payer: BC Managed Care – PPO | Admitting: Gynecology

## 2014-02-02 DIAGNOSIS — N920 Excessive and frequent menstruation with regular cycle: Secondary | ICD-10-CM

## 2014-02-02 DIAGNOSIS — D251 Intramural leiomyoma of uterus: Secondary | ICD-10-CM | POA: Insufficient documentation

## 2014-02-02 DIAGNOSIS — D259 Leiomyoma of uterus, unspecified: Secondary | ICD-10-CM

## 2014-02-02 MED ORDER — TRANEXAMIC ACID 650 MG PO TABS: 1300.0000 mg | ORAL_TABLET | Freq: Three times a day (TID) | ORAL | Status: DC

## 2014-02-02 NOTE — Progress Notes (Signed)
   Patient is a 43 year old, who presented to the office today to discuss an ultrasound as a result of findings during the CT scan for her recurrent  inguinal hernia in which the following with describe other radiologist:  Reproductive: The uterus demonstrates multiple small fibroids. Both ovaries appear normal. No pelvic mass, adenopathy or free pelvic fluid collections. No inguinal mass or hernia. Bilateral paramidline vaginal hernias are noted.  Ultrasound today and office: Uterus measured 9.2 x 6.1 x 5.6 cm with endometrial stripe of 11.9 mm (patient currently on day 22 of her cycle) patient with 3 small fibroids the largest one measuring 31 x 25 mm intramural was noted. Ovaries were otherwise normal and no fluid in the cul-de-sac  The only major complaint from the gynecological standpoint the patient's been experiencing  has been heavy. She is last 5-7 days. Patient has had a previous tubal sterilization procedure.. Back in 2014 she had the same issues and we discussed treatment options to include endometrial ablation, Mirena IUD versus total laparoscopic hysterectomy and literature information was provided. I had also prescribed Lysteda 650 mg 2 tablets 3 times a day for 5 days for her menorrhagia. In which she never picked up the prescription.  Assessment/plan: Incidental finding at time of CT scan small intramural myoma for which patient is asymptomatic. Literature information on fibroids was discussed with the patient and pictures shown. No intervention needed. For her menorrhagia she will be prescribed once again Lysteda 650 mg 2 tablets 3 times a day for 5 days . We will check her CBC today. She will make an appointment for her overdue annual exam and we'll rediscuss treatment options once again.

## 2014-02-02 NOTE — Patient Instructions (Signed)
Tranexamic acid oral tablets What is this medicine? TRANEXAMIC ACID (TRAN ex AM ik AS id) slows down or stops blood clots from being broken down. This medicine is used to treat heavy monthly menstrual bleeding. This medicine may be used for other purposes; ask your health care provider or pharmacist if you have questions. COMMON BRAND NAME(S): Cyklokapron, Lysteda What should I tell my health care provider before I take this medicine? They need to know if you have any of these conditions: -bleeding in the brain -blood clotting problems -kidney disease -vision problems -an unusual allergic reaction to tranexamic acid, other medicines, foods, dyes, or preservatives -pregnant or trying to get pregnant -breast-feeding How should I use this medicine? Take this medicine by mouth with a glass of water. Follow the directions on the prescription label. Do not cut, crush, or chew this medicine. You can take it with or without food. If it upsets your stomach, take it with food. Take your medicine at regular intervals. Do not take it more often than directed. Do not stop taking except on your doctor's advice. Do not take this medicine until your period has started. Do not take it for more than 5 days in a row. Do not take this medicine when you do not have your period. Talk to your pediatrician regarding the use of this medicine in children. While this drug may be prescribed for female children as young as 12 years of age for selected conditions, precautions do apply. Overdosage: If you think you've taken too much of this medicine contact a poison control center or emergency room at once. Overdosage: If you think you have taken too much of this medicine contact a poison control center or emergency room at once. NOTE: This medicine is only for you. Do not share this medicine with others. What if I miss a dose? If you miss a dose, take it when you remember, and then take your next dose at least 6 hours  later. Do not take more than 2 tablets at a time to make up for missed doses. What may interact with this medicine? Do not take this medicine with any of the following medications: -female hormones, like estrogens or progestins and birth control pills, patches, rings, or injections This medicine may also interact with the following medications: -certain medicines used to help your blood clot or break up blood clots -certain medicines used to treat leukemia This list may not describe all possible interactions. Give your health care provider a list of all the medicines, herbs, non-prescription drugs, or dietary supplements you use. Also tell them if you smoke, drink alcohol, or use illegal drugs. Some items may interact with your medicine. What should I watch for while using this medicine? Tell your doctor or healthcare professional if your symptoms do not start to get better or if they get worse. Tell your doctor or healthcare professional if you notice any eye problems while taking this medicine. Your doctor will refer you to an eye doctor who will examine your eyes. What side effects may I notice from receiving this medicine? Side effects that you should report to your doctor or health care professional as soon as possible: -allergic reactions like skin rash, itching or hives, swelling of the face, lips, or tongue -breathing difficulties -changes in vision -sudden or severe pain in the chest, legs, head, or groin -unusually weak or tired Side effects that usually do not require medical attention (Report these to your doctor or health care professional if   they continue or are bothersome.): -back pain -headache -muscle or joint aches -sinus and nasal problems -stomach pain -tiredness This list may not describe all possible side effects. Call your doctor for medical advice about side effects. You may report side effects to FDA at 1-800-FDA-1088. Where should I keep my medicine? Keep out of  the reach of children. Store at room temperature between 15 and 30 degrees C (59 and 86 degrees F). Throw away any unused medicine after the expiration date. NOTE: This sheet is a summary. It may not cover all possible information. If you have questions about this medicine, talk to your doctor, pharmacist, or health care provider.  2015, [employer] Standard. (2012-01-25 17:45:19) Fibroids Fibroids are lumps (tumors) that can occur any place in a woman's body. These lumps are not cancerous. Fibroids vary in size, weight, and where they grow. HOME CARE  Do not take aspirin.  Write down the number of pads or tampons you use during your period. Tell your doctor. This can help determine the best treatment for you. GET HELP RIGHT AWAY IF:  You have pain in your lower belly (abdomen) that is not helped with medicine.  You have cramps that are not helped with medicine.  You have more bleeding between or during your period.  You feel lightheaded or pass out (faint).  Your lower belly pain gets worse. MAKE SURE YOU:  Understand these instructions.  Will watch your condition.  Will get help right away if you are not doing well or get worse. Document Released: 03/14/2010 Document Revised: 05/04/2011 Document Reviewed: 03/14/2010 [facility] Patient Information 2015 [place], [place]. This information is not intended to replace advice given to you by your health care provider. Make sure you discuss any questions you have with your health care provider.

## 2014-02-03 LAB — CBC WITH DIFFERENTIAL/PLATELET
Basophils Absolute: 0 10*3/uL (ref 0.0–0.1)
Basophils Relative: 0 % (ref 0–1)
Eosinophils Absolute: 0.3 10*3/uL (ref 0.0–0.7)
Eosinophils Relative: 4 % (ref 0–5)
HCT: 37.7 % (ref 36.0–46.0)
Hemoglobin: 12.8 g/dL (ref 12.0–15.0)
Lymphocytes Relative: 28 % (ref 12–46)
Lymphs Abs: 2 10*3/uL (ref 0.7–4.0)
MCH: 26.1 pg (ref 26.0–34.0)
MCHC: 34 g/dL (ref 30.0–36.0)
MCV: 76.9 fL — ABNORMAL LOW (ref 78.0–100.0)
MPV: 9.5 fL (ref 9.4–12.4)
Monocytes Absolute: 0.5 10*3/uL (ref 0.1–1.0)
Monocytes Relative: 7 % (ref 3–12)
Neutro Abs: 4.4 10*3/uL (ref 1.7–7.7)
Neutrophils Relative %: 61 % (ref 43–77)
Platelets: 290 10*3/uL (ref 150–400)
RBC: 4.9 MIL/uL (ref 3.87–5.11)
RDW: 14.9 % (ref 11.5–15.5)
WBC: 7.2 10*3/uL (ref 4.0–10.5)

## 2014-09-01 ENCOUNTER — Ambulatory Visit (INDEPENDENT_AMBULATORY_CARE_PROVIDER_SITE_OTHER): Payer: BLUE CROSS/BLUE SHIELD | Admitting: Emergency Medicine

## 2014-09-01 VITALS — BP 106/69 | HR 86 | Temp 98.6°F | Resp 16 | Ht 62.0 in | Wt 169.6 lb

## 2014-09-01 DIAGNOSIS — T63441A Toxic effect of venom of bees, accidental (unintentional), initial encounter: Secondary | ICD-10-CM | POA: Diagnosis not present

## 2014-09-01 MED ORDER — EPINEPHRINE 0.3 MG/0.3ML IJ SOAJ: INTRAMUSCULAR | Status: DC

## 2014-09-01 MED ORDER — PREDNISONE 20 MG PO TABS: ORAL_TABLET | ORAL | Status: DC

## 2014-09-01 NOTE — Progress Notes (Signed)
This chart was scribed for Arlyss Queen, MD by Marti Sleigh, Medical Scribe. This patient was seen in Room 11 and the patient's care was started at 3:43 PM.  Chief Complaint:  Chief Complaint  Patient presents with  . Insect Bite    states she is allergic to bees  . Ankle Pain    left ankle  . Joint Swelling    states her foot and ankle is swelling  . Generalized Body Aches    does not know if it is sinus or results from bee sting    HPI: Dana Rojas is a 44 y.o. female who reports to [facility] today complaining of a bee sting on the left ankle that occurred three days ago. Pt states she has iced it, taken benadryl, and applied a topical cortisone cream. Pt states the weakness and swelling in her ankle has gotten worse over the last three days, which is why she is reporting today.   Past Medical History  Diagnosis Date  . Allergy   . Seasonal allergies    Past Surgical History  Procedure Laterality Date  . Hernia repair    . Tubal ligation    . Hernia repair      X2   History   Social History  . Marital Status: Married    Spouse Name: N/A  . Number of Children: N/A  . Years of Education: N/A   Social History Main Topics  . Smoking status: Never Smoker   . Smokeless tobacco: Never Used  . Alcohol Use: No  . Drug Use: No  . Sexual Activity: Yes   Other Topics Concern  . None   Social History Narrative   Family History  Problem Relation Age of Onset  . Heart disease Mother   . Heart disease Father   . Diabetes Paternal Grandfather    Allergies  Allergen Reactions  . Bee Venom     Body swelling   Prior to Admission medications   Medication Sig Start Date End Date Taking? Authorizing Provider  DiphenhydrAMINE HCl (BENADRYL PO) Take by mouth.   Yes Historical Provider, MD  IBUPROFEN PO Take by mouth.   Yes Historical Provider, MD  loratadine (CLARITIN) 10 MG tablet Take 10 mg by mouth daily.   Yes Historical Provider, MD  ipratropium (ATROVENT) 0.03 %  nasal spray Place 2 sprays into the nose 2 (two) times daily. 1/88/41 6/60/63  [name], PA-C     ROS: The patient denies fevers, chills, night sweats, unintentional weight loss, chest pain, palpitations, wheezing, dyspnea on exertion, nausea, vomiting, abdominal pain, dysuria, hematuria, melena, numbness, weakness, or tingling.  All other systems have been reviewed and were otherwise negative with the exception of those mentioned in the HPI and as above.    PHYSICAL EXAM: Filed Vitals:   09/01/14 1540  BP: 106/69  Pulse: 86  Temp: 98.6 F (37 C)  Resp: 16   Body mass index is 31.01 kg/(m^2).   General: Alert, no acute distress HEENT:  Normocephalic, atraumatic, oropharynx patent. Eye: Dana Rojas [facility] Cardiovascular:  Regular rate and rhythm, no rubs murmurs or gallops.  No Carotid bruits, radial pulse intact. No pedal edema.  Respiratory: Clear to auscultation bilaterally.  No wheezes, rales, or rhonchi.  No cyanosis, no use of accessory musculature Abdominal: No organomegaly, abdomen is soft and non-tender, positive bowel sounds.  No masses. Musculoskeletal: Gait intact. No edema, tenderness Skin: No rashes. Neurologic: Facial musculature symmetric. Psychiatric: Patient acts appropriately throughout  our interaction. Lymphatic: No cervical or submandibular lymphadenopathy    LABS: Results for orders placed or performed in visit on 02/02/14  CBC with Differential  Result Value Ref Range   WBC 7.2 4.0 - 10.5 K/uL   RBC 4.90 3.87 - 5.11 MIL/uL   Hemoglobin 12.8 12.0 - 15.0 g/dL   HCT 37.7 36.0 - 46.0 %   MCV 76.9 (L) 78.0 - 100.0 fL   MCH 26.1 26.0 - 34.0 pg   MCHC 34.0 30.0 - 36.0 g/dL   RDW 14.9 11.5 - 15.5 %   Platelets 290 150 - 400 K/uL   MPV 9.5 9.4 - 12.4 fL   Neutrophils Relative % 61 43 - 77 %   Neutro Abs 4.4 1.7 - 7.7 K/uL   Lymphocytes Relative 28 12 - 46 %   Lymphs Abs 2.0 0.7 - 4.0 K/uL   Monocytes Relative 7 3 - 12 %   Monocytes Absolute 0.5 0.1  - 1.0 K/uL   Eosinophils Relative 4 0 - 5 %   Eosinophils Absolute 0.3 0.0 - 0.7 K/uL   Basophils Relative 0 0 - 1 %   Basophils Absolute 0.0 0.0 - 0.1 K/uL   Smear Review Criteria for review not met      EKG/XRAY:   Primary read interpreted by Dr. Everlene Farrier at [facility].   ASSESSMENT/PLAN: She has a significant local reaction to a yellow jacket sting. She will continue ice elevation Claritin and Benadryl at night. I did add prednisone 20 mg 2 a day for 5 days she was also given a prescription for an EpiPen.I personally performed the services described in this documentation, which was scribed in my presence. The recorded information has been reviewed and is accurate.  Nena Jordan, MD   Gross sideeffects, risk and benefits, and alternatives of medications d/w patient. Patient is aware that all medications have potential sideeffects and we are unable to predict every sideeffect or drug-drug interaction that may occur.  Arlyss Queen MD 09/01/2014 3:43 PM

## 2014-11-02 ENCOUNTER — Telehealth: Payer: Self-pay | Admitting: *Deleted

## 2014-11-02 MED ORDER — TRANEXAMIC ACID 650 MG PO TABS: 1300.0000 mg | ORAL_TABLET | Freq: Three times a day (TID) | ORAL | Status: DC

## 2014-11-02 NOTE — Telephone Encounter (Signed)
Agree 

## 2014-11-02 NOTE — Telephone Encounter (Signed)
Patient aware with the below, re-sent Rx for lysteda

## 2014-11-02 NOTE — Telephone Encounter (Signed)
Pt called c/o 14 day cycle, stopped for 4 days and restarted last night. Pt said bleeding now is not heavy changing pads every 4 hours with small clots, has annual scheduled with Izora Gala on 11/08/14. On OV 02/02/14 your prescribed Lysteda 650 mg 2 tablets 3 times a day for 5 days for menorrhagia. Patient never started this medication, I suggested to patient if bleeding becomes heavy again to start Lysteda Rx and follow up with nancy at annual. I told patient I will confirm with you to see if you agree. Please advise

## 2014-11-08 ENCOUNTER — Other Ambulatory Visit: Payer: Self-pay | Admitting: Women's Health

## 2014-11-08 ENCOUNTER — Encounter: Payer: Self-pay | Admitting: Women's Health

## 2014-11-08 ENCOUNTER — Other Ambulatory Visit (HOSPITAL_COMMUNITY)
Admission: RE | Admit: 2014-11-08 | Discharge: 2014-11-08 | Disposition: A | Discharge status: Home / Self Care | Payer: BLUE CROSS/BLUE SHIELD | Source: Ambulatory Visit | Attending: Women's Health | Admitting: Women's Health

## 2014-11-08 ENCOUNTER — Ambulatory Visit (INDEPENDENT_AMBULATORY_CARE_PROVIDER_SITE_OTHER): Payer: BLUE CROSS/BLUE SHIELD | Admitting: Women's Health

## 2014-11-08 VITALS — BP 110/70 | Ht 62.0 in | Wt 170.0 lb

## 2014-11-08 DIAGNOSIS — Z01419 Encounter for gynecological examination (general) (routine) without abnormal findings: Secondary | ICD-10-CM

## 2014-11-08 DIAGNOSIS — Z1151 Encounter for screening for human papillomavirus (HPV): Secondary | ICD-10-CM | POA: Insufficient documentation

## 2014-11-08 DIAGNOSIS — N938 Other specified abnormal uterine and vaginal bleeding: Secondary | ICD-10-CM | POA: Diagnosis not present

## 2014-11-08 NOTE — Patient Instructions (Signed)
Health Maintenance Adopting a healthy lifestyle and getting preventive care can go a long way to promote health and wellness. Talk with your health care provider about what schedule of regular examinations is right for you. This is a good chance for you to check in with your provider about disease prevention and staying healthy. In between checkups, there are plenty of things you can do on your own. Experts have done a lot of research about which lifestyle changes and preventive measures are most likely to keep you healthy. Ask your health care provider for more information. WEIGHT AND DIET  Eat a healthy diet  Be sure to include plenty of vegetables, fruits, low-fat dairy products, and lean protein.  Do not eat a lot of foods high in solid fats, added sugars, or salt.  Get regular exercise. This is one of the most important things you can do for your health.  Most adults should exercise for at least 150 minutes each week. The exercise should increase your heart rate and make you sweat (moderate-intensity exercise).  Most adults should also do strengthening exercises at least twice a week. This is in addition to the moderate-intensity exercise.  Maintain a healthy weight  Body mass index (BMI) is a measurement that can be used to identify possible weight problems. It estimates body fat based on height and weight. Your health care provider can help determine your BMI and help you achieve or maintain a healthy weight.  For females 25 years of age and older:   A BMI below 18.5 is considered underweight.  A BMI of 18.5 to 24.9 is normal.  A BMI of 25 to 29.9 is considered overweight.  A BMI of 30 and above is considered obese.  Watch levels of cholesterol and blood lipids  You should start having your blood tested for lipids and cholesterol at 44 years of age, then have this test every 5 years.  You may need to have your cholesterol levels checked more often if:  Your lipid or  cholesterol levels are high.  You are older than 44 years of age.  You are at high risk for heart disease.  CANCER SCREENING   Lung Cancer  Lung cancer screening is recommended for adults 97-92 years old who are at high risk for lung cancer because of a history of smoking.  A yearly low-dose CT scan of the lungs is recommended for people who:  Currently smoke.  Have quit within the past 15 years.  Have at least a 30-pack-year history of smoking. A pack year is smoking an average of one pack of cigarettes a day for 1 year.  Yearly screening should continue until it has been 15 years since you quit.  Yearly screening should stop if you develop a health problem that would prevent you from having lung cancer treatment.  Breast Cancer  Practice breast self-awareness. This means understanding how your breasts normally appear and feel.  It also means doing regular breast self-exams. Let your health care provider know about any changes, no matter how small.  If you are in your 20s or 30s, you should have a clinical breast exam (CBE) by a health care provider every 1-3 years as part of a regular health exam.  If you are 76 or older, have a CBE every year. Also consider having a breast X-ray (mammogram) every year.  If you have a family history of breast cancer, talk to your health care provider about genetic screening.  If you are  at high risk for breast cancer, talk to your health care provider about having an MRI and a mammogram every year.  Breast cancer gene (BRCA) assessment is recommended for women who have family members with BRCA-related cancers. BRCA-related cancers include:  Breast.  Ovarian.  Tubal.  Peritoneal cancers.  Results of the assessment will determine the need for genetic counseling and BRCA1 and BRCA2 testing. Cervical Cancer Routine pelvic examinations to screen for cervical cancer are no longer recommended for nonpregnant women who are considered low  risk for cancer of the pelvic organs (ovaries, uterus, and vagina) and who do not have symptoms. A pelvic examination may be necessary if you have symptoms including those associated with pelvic infections. Ask your health care provider if a screening pelvic exam is right for you.   The Pap test is the screening test for cervical cancer for women who are considered at risk.  If you had a hysterectomy for a problem that was not cancer or a condition that could lead to cancer, then you no longer need Pap tests.  If you are older than 65 years, and you have had normal Pap tests for the past 10 years, you no longer need to have Pap tests.  If you have had past treatment for cervical cancer or a condition that could lead to cancer, you need Pap tests and screening for cancer for at least 20 years after your treatment.  If you no longer get a Pap test, assess your risk factors if they change (such as having a new sexual partner). This can affect whether you should start being screened again.  Some women have medical problems that increase their chance of getting cervical cancer. If this is the case for you, your health care provider may recommend more frequent screening and Pap tests.  The human papillomavirus (HPV) test is another test that may be used for cervical cancer screening. The HPV test looks for the virus that can cause cell changes in the cervix. The cells collected during the Pap test can be tested for HPV.  The HPV test can be used to screen women 30 years of age and older. Getting tested for HPV can extend the interval between normal Pap tests from three to five years.  An HPV test also should be used to screen women of any age who have unclear Pap test results.  After 44 years of age, women should have HPV testing as often as Pap tests.  Colorectal Cancer  This type of cancer can be detected and often prevented.  Routine colorectal cancer screening usually begins at 44 years of  age and continues through 44 years of age.  Your health care provider may recommend screening at an earlier age if you have risk factors for colon cancer.  Your health care provider may also recommend using home test kits to check for hidden blood in the stool.  A small camera at the end of a tube can be used to examine your colon directly (sigmoidoscopy or colonoscopy). This is done to check for the earliest forms of colorectal cancer.  Routine screening usually begins at age 50.  Direct examination of the colon should be repeated every 5-10 years through 44 years of age. However, you may need to be screened more often if early forms of precancerous polyps or small growths are found. Skin Cancer  Check your skin from head to toe regularly.  Tell your health care provider about any new moles or changes in   moles, especially if there is a change in a mole's shape or color.  Also tell your health care provider if you have a mole that is larger than the size of a pencil eraser.  Always use sunscreen. Apply sunscreen liberally and repeatedly throughout the day.  Protect yourself by wearing long sleeves, pants, a wide-brimmed hat, and sunglasses whenever you are outside. HEART DISEASE, DIABETES, AND HIGH BLOOD PRESSURE   Have your blood pressure checked at least every 1-2 years. High blood pressure causes heart disease and increases the risk of stroke.  If you are between 75 years and 42 years old, ask your health care provider if you should take aspirin to prevent strokes.  Have regular diabetes screenings. This involves taking a blood sample to check your fasting blood sugar level.  If you are at a normal weight and have a low risk for diabetes, have this test once every three years after 44 years of age.  If you are overweight and have a high risk for diabetes, consider being tested at a younger age or more often. PREVENTING INFECTION  Hepatitis B  If you have a higher risk for  hepatitis B, you should be screened for this virus. You are considered at high risk for hepatitis B if:  You were born in a country where hepatitis B is common. Ask your health care provider which countries are considered high risk.  Your parents were born in a high-risk country, and you have not been immunized against hepatitis B (hepatitis B vaccine).  You have HIV or AIDS.  You use needles to inject street drugs.  You live with someone who has hepatitis B.  You have had sex with someone who has hepatitis B.  You get hemodialysis treatment.  You take certain medicines for conditions, including cancer, organ transplantation, and autoimmune conditions. Hepatitis C  Blood testing is recommended for:  Everyone born from [birth year] through 1965.  Anyone with known risk factors for hepatitis C. Sexually transmitted infections (STIs)  You should be screened for sexually transmitted infections (STIs) including gonorrhea and chlamydia if:  You are sexually active and are younger than 44 years of age.  You are older than 44 years of age and your health care provider tells you that you are at risk for this type of infection.  Your sexual activity has changed since you were last screened and you are at an increased risk for chlamydia or gonorrhea. Ask your health care provider if you are at risk.  If you do not have HIV, but are at risk, it may be recommended that you take a prescription medicine daily to prevent HIV infection. This is called pre-exposure prophylaxis (PrEP). You are considered at risk if:  You are sexually active and do not regularly use condoms or know the HIV status of your partner(s).  You take drugs by injection.  You are sexually active with a partner who has HIV. Talk with your health care provider about whether you are at high risk of being infected with HIV. If you choose to begin PrEP, you should first be tested for HIV. You should then be tested every 3 months for  as long as you are taking PrEP.  PREGNANCY   If you are premenopausal and you may become pregnant, ask your health care provider about preconception counseling.  If you may become pregnant, take 400 to 800 micrograms (mcg) of folic acid every day.  If you want to prevent pregnancy, talk to your  health care provider about birth control (contraception). OSTEOPOROSIS AND MENOPAUSE   Osteoporosis is a disease in which the bones lose minerals and strength with aging. This can result in serious bone fractures. Your risk for osteoporosis can be identified using a bone density scan.  If you are 80 years of age or older, or if you are at risk for osteoporosis and fractures, ask your health care provider if you should be screened.  Ask your health care provider whether you should take a calcium or vitamin D supplement to lower your risk for osteoporosis.  Menopause may have certain physical symptoms and risks.  Hormone replacement therapy may reduce some of these symptoms and risks. Talk to your health care provider about whether hormone replacement therapy is right for you.  HOME CARE INSTRUCTIONS   Schedule regular health, dental, and eye exams.  Stay current with your immunizations.   Do not use any tobacco products including cigarettes, chewing tobacco, or electronic cigarettes.  If you are pregnant, do not drink alcohol.  If you are breastfeeding, limit how much and how often you drink alcohol.  Limit alcohol intake to no more than 1 drink per day for nonpregnant women. One drink equals 12 ounces of beer, 5 ounces of wine, or 1 ounces of hard liquor.  Do not use street drugs.  Do not share needles.  Ask your health care provider for help if you need support or information about quitting drugs.  Tell your health care provider if you often feel depressed.  Tell your health care provider if you have ever been abused or do not feel safe at home. Document Released: 08/25/2010  Document Revised: 06/26/2013 Document Reviewed: 01/11/2013 [facility] Patient Information 2015 [place], [place]. This information is not intended to replace advice given to you by your health care provider. Make sure you discuss any questions you have with your health care provider. Dysfunctional Uterine Bleeding Normally, menstrual periods begin between ages 76 to 42 in young women. A normal menstrual cycle/period may begin every 23 days up to 35 days and lasts from 1 to 7 days. Around 12 to 14 days before your menstrual period starts, ovulation (ovary produces an egg) occurs. When counting the time between menstrual periods, count from the first day of bleeding of the previous period to the first day of bleeding of the next period. Dysfunctional (abnormal) uterine bleeding is bleeding that is different from a normal menstrual period. Your periods may come earlier or later than usual. They may be lighter, have blood clots or be heavier. You may have bleeding between periods, or you may skip one period or more. You may have bleeding after sexual intercourse, bleeding after menopause, or no menstrual period. CAUSES   Pregnancy (normal, miscarriage, tubal).  IUDs (intrauterine device, birth control).  Birth control pills.  Hormone treatment.  Menopause.  Infection of the cervix.  Blood clotting problems.  Infection of the inside lining of the uterus.  Endometriosis, inside lining of the uterus growing in the pelvis and other female organs.  Adhesions (scar tissue) inside the uterus.  Obesity or severe weight loss.  Uterine polyps inside the uterus.  Cancer of the vagina, cervix, or uterus.  Ovarian cysts or polycystic ovary syndrome.  Medical problems (diabetes, thyroid disease).  Uterine fibroids (noncancerous tumor).  Problems with your female hormones.  Endometrial hyperplasia, very thick lining and enlarged cells inside of the uterus.  Medicines that interfere with  ovulation.  Radiation to the pelvis or abdomen.  Chemotherapy.  DIAGNOSIS   Your doctor will discuss the history of your menstrual periods, medicines you are taking, changes in your weight, stress in your life, and any medical problems you may have.  Your doctor will do a physical and pelvic examination.  Your doctor may want to perform certain tests to make a diagnosis, such as:  Pap test.  Blood tests.  Cultures for infection.  CT scan.  Ultrasound.  Hysteroscopy.  Laparoscopy.  MRI.  Hysterosalpingography.  D and C.  Endometrial biopsy. TREATMENT  Treatment will depend on the cause of the dysfunctional uterine bleeding (DUB). Treatment may include:  Observing your menstrual periods for a couple of months.  Prescribing medicines for medical problems, including:  Antibiotics.  Hormones.  Birth control pills.  Removing an IUD (intrauterine device, birth control).  Surgery:  D and C (scrape and remove tissue from inside the uterus).  Laparoscopy (examine inside the abdomen with a lighted tube).  Uterine ablation (destroy lining of the uterus with electrical current, laser, heat, or freezing).  Hysteroscopy (examine cervix and uterus with a lighted tube).  Hysterectomy (remove the uterus). HOME CARE INSTRUCTIONS   If medicines were prescribed, take exactly as directed. Do not change or switch medicines without consulting your caregiver.  Long term heavy bleeding may result in iron deficiency. Your caregiver may have prescribed iron pills. They help replace the iron that your body lost from heavy bleeding. Take exactly as directed.  Do not take aspirin or medicines that contain aspirin one week before or during your menstrual period. Aspirin may make the bleeding worse.  If you need to change your sanitary pad or tampon more than once every 2 hours, stay in bed with your feet elevated and a cold pack on your lower abdomen. Rest as much as possible,  until the bleeding stops or slows down.  Eat well-balanced meals. Eat foods high in iron. Examples are:  Leafy green vegetables.  Whole-grain breads and cereals.  Eggs.  Meat.  Liver.  Do not try to lose weight until the abnormal bleeding has stopped and your blood iron level is back to normal. Do not lift more than ten pounds or do strenuous activities when you are bleeding.  For a couple of months, make note on your calendar, marking the start and ending of your period, and the type of bleeding (light, medium, heavy, spotting, clots or missed periods). This is for your caregiver to better evaluate your problem. SEEK MEDICAL CARE IF:   You develop nausea (feeling sick to your stomach) and vomiting, dizziness, or diarrhea while you are taking your medicine.  You are getting lightheaded or weak.  You have any problems that may be related to the medicine you are taking.  You develop pain with your DUB.  You want to remove your IUD.  You want to stop or change your birth control pills or hormones.  You have any type of abnormal bleeding mentioned above.  You are over 22 years old and have not had a menstrual period yet.  You are 44 years old and you are still having menstrual periods.  You have any of the symptoms mentioned above.  You develop a rash. SEEK IMMEDIATE MEDICAL CARE IF:   An oral temperature above 102 F (38.9 C) develops.  You develop chills.  You are changing your sanitary pad or tampon more than once an hour.  You develop abdominal pain.  You pass out or faint. Document Released: 02/07/2000 Document Revised: 05/04/2011 Document Reviewed:  01/08/2009 [employer] Patient Information 2015 [place], [place]. This information is not intended to replace advice given to you by your health care provider. Make sure you discuss any questions you have with your health care provider.

## 2014-11-08 NOTE — Progress Notes (Signed)
Dana Rojas September 06, 1970 [id number]    History:    Presents for annual exam.  Monthly 7 day heavy cycles until August, has bled for 3 weeks with clots reports using 3 boxes of tampons and one box of pads. 01/2014 ultrasound showed 3 fibroids 3 cm, 2 cm, 1 cm. Normal Pap and mammogram history.  Past medical history, past surgical history, family history and social history were all reviewed and documented in the EPIC chart. Works at a [employer]. 2 children both doing well ages 5 and 43. GDM.  ROS:  A ROS was performed and pertinent positives and negatives are included.  Exam:  Filed Vitals:   11/08/14 1535  BP: 110/70    General appearance:  Normal Thyroid:  Symmetrical, normal in size, without palpable masses or nodularity. Respiratory  Auscultation:  Clear without wheezing or rhonchi Cardiovascular  Auscultation:  Regular rate, without rubs, murmurs or gallops  Edema/varicosities:  Not grossly evident Abdominal  Soft,nontender, without masses, guarding or rebound.  Liver/spleen:  No organomegaly noted  Hernia:  None appreciated  Skin  Inspection:  Grossly normal   Breasts: Examined lying and sitting.     Right: Without masses, retractions, discharge or axillary adenopathy.     Left: Without masses, retractions, discharge or axillary adenopathy. Gentitourinary   Inguinal/mons:  Normal without inguinal adenopathy  External genitalia:  Normal  BUS/Urethra/Skene's glands:  Normal  Vagina:  Scant menses  Cervix:  Normal  Uterus:  Nontender normal in size, shape and contour.  Midline and mobile  Adnexa/parametria:     Rt: Without masses or tenderness.   Lt: Without masses or tenderness.  Anus and perineum: Normal  Digital rectal exam: Normal sphincter tone without palpated masses or tenderness  Assessment/Plan:  44 y.o. MBF G2 P2  for annual exam.     DUB/menorrhagia 1 month BTL Fibroid uterus  Plan: Sonohysterogram with Dr. Toney Rakes will schedule. SBE's,  annual screening mammogram overdue instructed to schedule. Exercise, calcium rich diet, decrease calories for weight loss., Vitamin D 1000 daily encouraged. CBC, CMP, lipid panel, TSH, UA, Pap with HR HPV typing. New screening guidelines reviewed.   Huel Cote [facility], 5:01 PM 11/08/2014

## 2014-11-09 LAB — LIPID PANEL
Cholesterol: 236 mg/dL — ABNORMAL HIGH (ref 125–200)
HDL: 34 mg/dL — ABNORMAL LOW (ref >=46)
LDL Cholesterol: 158 mg/dL — ABNORMAL HIGH (ref <130)
Total CHOL/HDL Ratio: 6.9 Ratio — ABNORMAL HIGH (ref <=5.0)
Triglycerides: 219 mg/dL — ABNORMAL HIGH (ref <150)
VLDL: 44 mg/dL — ABNORMAL HIGH (ref <30)

## 2014-11-09 LAB — COMPREHENSIVE METABOLIC PANEL
ALT: 25 U/L (ref 6–29)
AST: 25 U/L (ref 10–30)
Albumin: 3.8 g/dL (ref 3.6–5.1)
Alkaline Phosphatase: 69 U/L (ref 33–115)
BUN: 12 mg/dL (ref 7–25)
CO2: 23 mmol/L (ref 20–31)
Calcium: 9.1 mg/dL (ref 8.6–10.2)
Chloride: 100 mmol/L (ref 98–110)
Creat: 0.59 mg/dL (ref 0.50–1.10)
Glucose, Bld: 63 mg/dL — ABNORMAL LOW (ref 65–99)
Potassium: 4.2 mmol/L (ref 3.5–5.3)
Sodium: 137 mmol/L (ref 135–146)
Total Bilirubin: 0.3 mg/dL (ref 0.2–1.2)
Total Protein: 6.7 g/dL (ref 6.1–8.1)

## 2014-11-09 LAB — T4, FREE: Free T4: 1.08 ng/dL (ref 0.80–1.80)

## 2014-11-09 LAB — CBC WITH DIFFERENTIAL/PLATELET
Basophils Absolute: 0 10*3/uL (ref 0.0–0.1)
Basophils Relative: 0 % (ref 0–1)
Eosinophils Absolute: 0.1 10*3/uL (ref 0.0–0.7)
Eosinophils Relative: 1 % (ref 0–5)
HCT: 36.8 % (ref 36.0–46.0)
Hemoglobin: 12 g/dL (ref 12.0–15.0)
Lymphocytes Relative: 26 % (ref 12–46)
Lymphs Abs: 1.8 10*3/uL (ref 0.7–4.0)
MCH: 26.4 pg (ref 26.0–34.0)
MCHC: 32.6 g/dL (ref 30.0–36.0)
MCV: 80.9 fL (ref 78.0–100.0)
MPV: 9.2 fL (ref 8.6–12.4)
Monocytes Absolute: 0.5 10*3/uL (ref 0.1–1.0)
Monocytes Relative: 7 % (ref 3–12)
Neutro Abs: 4.6 10*3/uL (ref 1.7–7.7)
Neutrophils Relative %: 66 % (ref 43–77)
Platelets: 321 10*3/uL (ref 150–400)
RBC: 4.55 MIL/uL (ref 3.87–5.11)
RDW: 14.6 % (ref 11.5–15.5)
WBC: 6.9 10*3/uL (ref 4.0–10.5)

## 2014-11-09 LAB — TSH: TSH: 0.247 u[IU]/mL — ABNORMAL LOW (ref 0.350–4.500)

## 2014-11-10 LAB — THYROID ANTIBODIES
Thyroglobulin Ab: <1 IU/mL (ref <2)
Thyroperoxidase Ab SerPl-aCnc: 1 IU/mL (ref <9)

## 2014-11-12 LAB — CYTOLOGY - PAP

## 2014-11-20 ENCOUNTER — Telehealth: Payer: Self-pay | Admitting: *Deleted

## 2014-11-20 DIAGNOSIS — E0590 Thyrotoxicosis, unspecified without thyrotoxic crisis or storm: Secondary | ICD-10-CM

## 2014-11-20 NOTE — Telephone Encounter (Signed)
-----   Message from Ramond Craver, [place] sent at 11/20/2014  9:17 AM EDT ----- Regarding: referral to endocrinologist Notes Recorded by Huel Cote, NP on 11/12/2014 at 3:52 PM  Please call and review TSH very low, hyperthyroid, neg antibodies, normal T4, will need appt with endocrinologist.   I spoke with patient. She will wait to hear from someone about appt. Thanks!

## 2014-11-20 NOTE — Telephone Encounter (Signed)
Referral placed they will contact pt to schedule. 

## 2014-11-26 NOTE — Telephone Encounter (Signed)
Office tried to contact pt x 1, I called pt and left message on her voicemail to please call back to schedule

## 2014-11-28 NOTE — Telephone Encounter (Signed)
Appointment on 12/06/14 @ 10:45pm

## 2014-12-06 ENCOUNTER — Ambulatory Visit: Payer: BLUE CROSS/BLUE SHIELD | Admitting: Endocrinology

## 2014-12-17 ENCOUNTER — Ambulatory Visit (INDEPENDENT_AMBULATORY_CARE_PROVIDER_SITE_OTHER): Payer: BLUE CROSS/BLUE SHIELD | Admitting: Endocrinology

## 2014-12-17 ENCOUNTER — Encounter: Payer: Self-pay | Admitting: Endocrinology

## 2014-12-17 VITALS — BP 122/70 | HR 82 | Temp 98.1°F | Ht 62.0 in | Wt 170.0 lb

## 2014-12-17 DIAGNOSIS — E0590 Thyrotoxicosis, unspecified without thyrotoxic crisis or storm: Secondary | ICD-10-CM

## 2014-12-17 NOTE — Patient Instructions (Signed)
I am not sure, but this mild thyroid abnormality is usually caused by a "lumpy thyroid."   i would be happy to request an ultrasound for you, but it is not worth your money now.  most of the time, a "lumpy thyroid" will eventually become more overactive.  this is usually a slow process, happening over the span of many years. Therefore, you should have the thyroid blood test checked at least each year.  I would be happy to see you back here as necessary.

## 2014-12-17 NOTE — Progress Notes (Signed)
Subjective:    Patient ID: Dana Rojas, female    DOB: 1970-11-06, 44 y.o.   MRN: [id number]  HPI 1 month ago, pt was noted to have slightly suppressed TSH.  She says she had thyroid abnormalities with each of her pregnancies (first was in 2003).  she has never been on any thyroid therapy.  she has never had XRT to the anterior neck, or thyroid surgery.  she has never had thyroid imaging.  she does not consume kelp or any other prescribed or non-prescribed thyroid medication.  she has never been on amiodarone.  She has mild intermittent headache, throughout the head, but no assoc visual loss Past Medical History  Diagnosis Date  . Allergy   . Seasonal allergies     Past Surgical History  Procedure Laterality Date  . Hernia repair    . Tubal ligation    . Hernia repair      X2    Social History   Social History  . Marital Status: Married    Spouse Name: N/A  . Number of Children: N/A  . Years of Education: N/A   Occupational History  . Not on file.   Social History Main Topics  . Smoking status: Never Smoker   . Smokeless tobacco: Never Used  . Alcohol Use: No  . Drug Use: No  . Sexual Activity: Yes   Other Topics Concern  . Not on file   Social History Narrative    Current Outpatient Prescriptions on File Prior to Visit  Medication Sig Dispense Refill  . DiphenhydrAMINE HCl (BENADRYL PO) Take by mouth.    . EPINEPHrine 0.3 mg/0.3 mL IJ SOAJ injection Reason minister if you have any respiratory distress following an insect sting and call [phone] Device 2  . IBUPROFEN PO Take by mouth.    . loratadine (CLARITIN) 10 MG tablet Take 10 mg by mouth daily.    Marland Kitchen ipratropium (ATROVENT) 0.03 % nasal spray Place 2 sprays into the nose 2 (two) times daily. 30 mL 0   No current facility-administered medications on file prior to visit.    Allergies  Allergen Reactions  . Bee Venom     Body swelling    Family History  Problem Relation Age of Onset  . Heart  disease Mother   . Heart disease Father   . Diabetes Paternal Grandfather   . Thyroid disease Neg Hx     BP 122/70 mmHg  Pulse 82  Temp(Src) 98.1 F (36.7 C) (Oral)  Ht 5\' 2"  (1.575 m)  Wt 170 lb (77.111 kg)  BMI 31.09 kg/m2  SpO2 98%  Review of Systems denies hoarseness, palpitations, sob, diarrhea, polyuria, muscle weakness, excessive diaphoresis, tremor, and anxiety.  She has weight gain, rhinorrhea, and easy bruising.  She has irreg menses.     Objective:   Physical Exam VS: see vs page GEN: no distress HEAD: head: no deformity eyes: no periorbital swelling, no proptosis external nose and ears are normal mouth: no lesion seen NECK: supple, thyroid is not enlarged CHEST WALL: no deformity LUNGS:  Clear to auscultation CV: reg rate and rhythm, no murmur ABD: abdomen is soft, nontender.  no hepatosplenomegaly.  not distended.  no hernia. MUSCULOSKELETAL: muscle bulk and strength are grossly normal.  no obvious joint swelling.  gait is normal and steady EXTEMITIES: no deformity.  no ulcer on the feet.  feet are of normal color and temp.  no edema PULSES: dorsalis pedis intact bilat.  no  carotid bruit NEURO:  cn 2-12 grossly intact.   readily moves all 4's.  sensation is intact to touch on the feet.  No tremor. SKIN:  Normal texture and temperature.  No rash or suspicious lesion is visible.  Not diaphoretic. NODES:  None palpable at the neck PSYCH: alert, well-oriented.  Does not appear anxious nor depressed.  Lab Results  Component Value Date   TSH 0.247* 11/08/2014    Radiol: chest CT (08/17/09): no mention is made of a goiter.    I have reviewed outside records, and summarized: pt was noted at routine ov to have abnormal TFT.     Assessment & Plan:  Mild hyperthyroidism, new to me, usually caused by a small multinodular goiter.   Headache, mild, unlikely to be thyroid-related.    Patient is advised the following: Patient Instructions  I am not sure, but this  mild thyroid abnormality is usually caused by a "lumpy thyroid."   i would be happy to request an ultrasound for you, but it is not worth your money now.  most of the time, a "lumpy thyroid" will eventually become more overactive.  this is usually a slow process, happening over the span of many years. Therefore, you should have the thyroid blood test checked at least each year.  I would be happy to see you back here as necessary.

## 2014-12-20 DIAGNOSIS — E0590 Thyrotoxicosis, unspecified without thyrotoxic crisis or storm: Secondary | ICD-10-CM | POA: Insufficient documentation

## 2015-06-14 ENCOUNTER — Encounter (HOSPITAL_BASED_OUTPATIENT_CLINIC_OR_DEPARTMENT_OTHER): Payer: Self-pay | Admitting: Emergency Medicine

## 2015-06-14 ENCOUNTER — Emergency Department (HOSPITAL_BASED_OUTPATIENT_CLINIC_OR_DEPARTMENT_OTHER)
Admission: EM | Admit: 2015-06-14 | Discharge: 2015-06-14 | Disposition: A | Discharge status: Home / Self Care | Payer: BLUE CROSS/BLUE SHIELD | Attending: Emergency Medicine | Admitting: Emergency Medicine

## 2015-06-14 ENCOUNTER — Emergency Department (HOSPITAL_BASED_OUTPATIENT_CLINIC_OR_DEPARTMENT_OTHER): Payer: BLUE CROSS/BLUE SHIELD

## 2015-06-14 ENCOUNTER — Emergency Department (HOSPITAL_COMMUNITY)
Admission: EM | Admit: 2015-06-14 | Discharge: 2015-06-14 | Disposition: A | Discharge status: Home / Self Care | Payer: BLUE CROSS/BLUE SHIELD | Source: Home / Self Care

## 2015-06-14 DIAGNOSIS — M7989 Other specified soft tissue disorders: Secondary | ICD-10-CM | POA: Diagnosis present

## 2015-06-14 DIAGNOSIS — R609 Edema, unspecified: Secondary | ICD-10-CM

## 2015-06-14 DIAGNOSIS — R0602 Shortness of breath: Secondary | ICD-10-CM | POA: Insufficient documentation

## 2015-06-14 DIAGNOSIS — R140 Abdominal distension (gaseous): Secondary | ICD-10-CM | POA: Insufficient documentation

## 2015-06-14 DIAGNOSIS — Z3202 Encounter for pregnancy test, result negative: Secondary | ICD-10-CM | POA: Insufficient documentation

## 2015-06-14 DIAGNOSIS — M79669 Pain in unspecified lower leg: Secondary | ICD-10-CM | POA: Diagnosis not present

## 2015-06-14 DIAGNOSIS — E559 Vitamin D deficiency, unspecified: Secondary | ICD-10-CM | POA: Diagnosis not present

## 2015-06-14 DIAGNOSIS — R05 Cough: Secondary | ICD-10-CM | POA: Diagnosis not present

## 2015-06-14 DIAGNOSIS — Z8709 Personal history of other diseases of the respiratory system: Secondary | ICD-10-CM | POA: Insufficient documentation

## 2015-06-14 DIAGNOSIS — D259 Leiomyoma of uterus, unspecified: Secondary | ICD-10-CM | POA: Diagnosis not present

## 2015-06-14 DIAGNOSIS — R600 Localized edema: Secondary | ICD-10-CM | POA: Diagnosis not present

## 2015-06-14 DIAGNOSIS — Z79899 Other long term (current) drug therapy: Secondary | ICD-10-CM | POA: Insufficient documentation

## 2015-06-14 DIAGNOSIS — L03115 Cellulitis of right lower limb: Secondary | ICD-10-CM | POA: Diagnosis not present

## 2015-06-14 HISTORY — DX: Vitamin D deficiency, unspecified: E55.9

## 2015-06-14 LAB — URINALYSIS, ROUTINE W REFLEX MICROSCOPIC
Bilirubin Urine: NEGATIVE
Glucose, UA: NEGATIVE mg/dL
Hgb urine dipstick: NEGATIVE
Ketones, ur: NEGATIVE mg/dL
Leukocytes, UA: NEGATIVE
Nitrite: NEGATIVE
Protein, ur: NEGATIVE mg/dL
Specific Gravity, Urine: 1.013 (ref 1.005–1.030)
pH: 6.5 (ref 5.0–8.0)

## 2015-06-14 LAB — COMPREHENSIVE METABOLIC PANEL
ALT: 21 U/L (ref 14–54)
AST: 23 U/L (ref 15–41)
Albumin: 3.6 g/dL (ref 3.5–5.0)
Alkaline Phosphatase: 71 U/L (ref 38–126)
Anion gap: 7 (ref 5–15)
BUN: 9 mg/dL (ref 6–20)
CO2: 24 mmol/L (ref 22–32)
Calcium: 8.8 mg/dL — ABNORMAL LOW (ref 8.9–10.3)
Chloride: 106 mmol/L (ref 101–111)
Creatinine, Ser: 0.47 mg/dL (ref 0.44–1.00)
GFR calc Af Amer: >60 mL/min (ref >60)
GFR calc non Af Amer: >60 mL/min (ref >60)
Glucose, Bld: 84 mg/dL (ref 65–99)
Potassium: 3.7 mmol/L (ref 3.5–5.1)
Sodium: 137 mmol/L (ref 135–145)
Total Bilirubin: 0.5 mg/dL (ref 0.3–1.2)
Total Protein: 7.2 g/dL (ref 6.5–8.1)

## 2015-06-14 LAB — CBC WITH DIFFERENTIAL/PLATELET
Basophils Absolute: 0 10*3/uL (ref 0.0–0.1)
Basophils Relative: 0 %
Eosinophils Absolute: 0.2 10*3/uL (ref 0.0–0.7)
Eosinophils Relative: 3 %
HCT: 35.8 % — ABNORMAL LOW (ref 36.0–46.0)
Hemoglobin: 11.9 g/dL — ABNORMAL LOW (ref 12.0–15.0)
Lymphocytes Relative: 25 %
Lymphs Abs: 2 10*3/uL (ref 0.7–4.0)
MCH: 26.9 pg (ref 26.0–34.0)
MCHC: 33.2 g/dL (ref 30.0–36.0)
MCV: 81 fL (ref 78.0–100.0)
Monocytes Absolute: 0.7 10*3/uL (ref 0.1–1.0)
Monocytes Relative: 8 %
Neutro Abs: 5.2 10*3/uL (ref 1.7–7.7)
Neutrophils Relative %: 64 %
Platelets: 291 10*3/uL (ref 150–400)
RBC: 4.42 MIL/uL (ref 3.87–5.11)
RDW: 14.4 % (ref 11.5–15.5)
WBC: 8 10*3/uL (ref 4.0–10.5)

## 2015-06-14 LAB — D-DIMER, QUANTITATIVE: D-Dimer, Quant: 0.31 ug/mL-FEU (ref 0.00–0.50)

## 2015-06-14 LAB — TROPONIN I: Troponin I: <0.03 ng/mL (ref <0.031)

## 2015-06-14 LAB — BRAIN NATRIURETIC PEPTIDE: B Natriuretic Peptide: 18.1 pg/mL (ref 0.0–100.0)

## 2015-06-14 LAB — PREGNANCY, URINE: Preg Test, Ur: NEGATIVE

## 2015-06-14 MED ORDER — CEPHALEXIN 250 MG PO CAPS
250.0000 mg | ORAL_CAPSULE | Freq: Once | ORAL | Status: AC
  Administered 2015-06-14: 250 mg via ORAL
  Filled 2015-06-14: qty 1

## 2015-06-14 MED ORDER — CEPHALEXIN 500 MG PO CAPS: 500.0000 mg | ORAL_CAPSULE | Freq: Four times a day (QID) | ORAL | Status: DC

## 2015-06-14 MED ORDER — ACETAMINOPHEN 500 MG PO TABS: 1000.0000 mg | ORAL_TABLET | Freq: Once | ORAL | Status: DC

## 2015-06-14 MED ORDER — POTASSIUM CHLORIDE ER 10 MEQ PO TBCR: 10.0000 meq | EXTENDED_RELEASE_TABLET | Freq: Two times a day (BID) | ORAL | Status: DC

## 2015-06-14 MED ORDER — FUROSEMIDE 20 MG PO TABS: 20.0000 mg | ORAL_TABLET | Freq: Every day | ORAL | Status: DC

## 2015-06-14 NOTE — Discharge Instructions (Signed)
Edema °Edema is an abnormal buildup of fluids in your body tissues. Edema is somewhat dependent on gravity to pull the fluid to the lowest place in your body. That makes the condition more common in the legs and thighs (lower extremities). Painless swelling of the feet and ankles is common and becomes more likely as you get older. It is also common in looser tissues, like around your eyes.  °When the affected area is squeezed, the fluid may move out of that spot and leave a dent for a few moments. This dent is called pitting.  °CAUSES  °There are many possible causes of edema. Eating too much salt and being on your feet or sitting for a long time can cause edema in your legs and ankles. Hot weather may make edema worse. Common medical causes of edema include: °· Heart failure. °· Liver disease. °· Kidney disease. °· Weak blood vessels in your legs. °· Cancer. °· An injury. °· Pregnancy. °· Some medications. °· Obesity.  °SYMPTOMS  °Edema is usually painless. Your skin may look swollen or shiny.  °DIAGNOSIS  °Your health care provider may be able to diagnose edema by asking about your medical history and doing a physical exam. You may need to have tests such as X-rays, an electrocardiogram, or blood tests to check for medical conditions that may cause edema.  °TREATMENT  °Edema treatment depends on the cause. If you have heart, liver, or kidney disease, you need the treatment appropriate for these conditions. General treatment may include: °· Elevation of the affected body part above the level of your heart. °· Compression of the affected body part. Pressure from elastic bandages or support stockings squeezes the tissues and forces fluid back into the blood vessels. This keeps fluid from entering the tissues. °· Restriction of fluid and salt intake. °· Use of a water pill (diuretic). These medications are appropriate only for some types of edema. They pull fluid out of your body and make you urinate more often. This  gets rid of fluid and reduces swelling, but diuretics can have side effects. Only use diuretics as directed by your health care provider. °HOME CARE INSTRUCTIONS  °· Keep the affected body part above the level of your heart when you are lying down.   °· Do not sit still or stand for prolonged periods.   °· Do not put anything directly under your knees when lying down. °· Do not wear constricting clothing or garters on your upper legs.   °· Exercise your legs to work the fluid back into your blood vessels. This may help the swelling go down.   °· Wear elastic bandages or support stockings to reduce ankle swelling as directed by your health care provider.   °· Eat a low-salt diet to reduce fluid if your health care provider recommends it.   °· Only take medicines as directed by your health care provider.  °SEEK MEDICAL CARE IF:  °· Your edema is not responding to treatment. °· You have heart, liver, or kidney disease and notice symptoms of edema. °· You have edema in your legs that does not improve after elevating them.   °· You have sudden and unexplained weight gain. °SEEK IMMEDIATE MEDICAL CARE IF:  °· You develop shortness of breath or chest pain.   °· You cannot breathe when you lie down. °· You develop pain, redness, or warmth in the swollen areas.   °· You have heart, liver, or kidney disease and suddenly get edema. °· You have a fever and your symptoms suddenly get worse. °MAKE SURE YOU:  °·   Understand these instructions.  Will watch your condition.  Will get help right away if you are not doing well or get worse.   This information is not intended to replace advice given to you by your health care provider. Make sure you discuss any questions you have with your health care provider.   Document Released: 02/09/2005 Document Revised: 03/02/2014 Document Reviewed: 12/02/2012 Elsevier Interactive Patient Education 2016 [employer]  Cellulitis Cellulitis is an infection of the skin and the tissue  beneath it. The infected area is usually red and tender. Cellulitis occurs most often in the arms and lower legs.  CAUSES  Cellulitis is caused by bacteria that enter the skin through cracks or cuts in the skin. The most common types of bacteria that cause cellulitis are staphylococci and streptococci. SIGNS AND SYMPTOMS   Redness and warmth.  Swelling.  Tenderness or pain.  Fever. DIAGNOSIS  Your health care provider can usually determine what is wrong based on a physical exam. Blood tests may also be done. TREATMENT  Treatment usually involves taking an antibiotic medicine. HOME CARE INSTRUCTIONS   Take your antibiotic medicine as directed by your health care provider. Finish the antibiotic even if you start to feel better.  Keep the infected arm or leg elevated to reduce swelling.  Apply a warm cloth to the affected area up to 4 times per day to relieve pain.  Take medicines only as directed by your health care provider.  Keep all follow-up visits as directed by your health care provider. SEEK MEDICAL CARE IF:   You notice red streaks coming from the infected area.  Your red area gets larger or turns dark in color.  Your bone or joint underneath the infected area becomes painful after the skin has healed.  Your infection returns in the same area or another area.  You notice a swollen bump in the infected area.  You develop new symptoms.  You have a fever. SEEK IMMEDIATE MEDICAL CARE IF:   You feel very sleepy. Cellulitis Cellulitis is an infection of the skin and the tissue beneath it. The infected area is usually red and tender. Cellulitis occurs most often in the arms and lower legs.  CAUSES  Cellulitis is caused by bacteria that enter the skin through cracks or cuts in the skin. The most common types of bacteria that cause cellulitis are staphylococci and streptococci. SIGNS AND SYMPTOMS  Redness and warmth. Swelling. Tenderness or pain. Fever. DIAGNOSIS    Your health care provider can usually determine what is wrong based on a physical exam. Blood tests may also be done. TREATMENT  Treatment usually involves taking an antibiotic medicine. HOME CARE INSTRUCTIONS  Take your antibiotic medicine as directed by your health care provider. Finish the antibiotic even if you start to feel better. Keep the infected arm or leg elevated to reduce swelling. Apply a warm cloth to the affected area up to 4 times per day to relieve pain. Take medicines only as directed by your health care provider. Keep all follow-up visits as directed by your health care provider. SEEK MEDICAL CARE IF:  You notice red streaks coming from the infected area. Your red area gets larger or turns dark in color. Your bone or joint underneath the infected area becomes painful after the skin has healed. Your infection returns in the same area or another area. You notice a swollen bump in the infected area. You develop new symptoms. You have a fever. SEEK IMMEDIATE MEDICAL CARE IF:  You feel very sleepy. You develop vomiting or diarrhea. You have a general ill feeling (malaise) with muscle aches and pains.   This information is not intended to replace advice given to you by your health care provider. Make sure you discuss any questions you have with your health care provider.   Document Released: 11/19/2004 Document Revised: 10/31/2014 Document Reviewed: 04/27/2011 Elsevier Interactive Patient Education 2016 [place] develop vomiting or diarrhea.  You have a general ill feeling (malaise) with muscle aches and pains.   This information is not intended to replace advice given to you by your health care provider. Make sure you discuss any questions you have with your health care provider.   Document Released: 11/19/2004 Document Revised: 10/31/2014 Document Reviewed: 04/27/2011 Elsevier Interactive Patient Education [employer].

## 2015-06-14 NOTE — ED Notes (Signed)
Patient reports that she is having swelling to her bilateral legs, and has gained 7 lbs in 2 weeks. The patient reports that she is also short of breath.

## 2015-06-14 NOTE — ED Notes (Signed)
Patient is alert and oriented x3.  She was given DC instructions and follow up visit instructions.  Patient gave verbal understanding. She was DC ambulatory under her own power to home.  V/S stable.  He was not showing any signs of distress on DC 

## 2015-06-14 NOTE — ED Provider Notes (Signed)
CSN: [id number]     Arrival date & time 06/14/15  1836 History  By signing my name below, I, Dana Rojas, attest that this documentation has been prepared under the direction and in the presence of [name], MD. Electronically Signed: Helane Rojas, ED Scribe. 06/14/2015. 7:25 PM.     Chief Complaint  Patient presents with  . Shortness of Breath   The history is provided by the patient. No language interpreter was used.   HPI Comments: Dana Rojas is a 45 y.o. female with a PMHx of allergies who presents to the Emergency Department complaining of SOB onset a few weeks ago. Pt states that at first she thought it was only due to her regular allergies. However, when she went to see her PCP today for worsening swelling to the bilateral legs and feet that began one week ago and has progressively worsened: traveling up her legs and causing distension of her abdomen. She was then sent to the ED to r/o a blood clot. She notes that since her last visit to the PCP 2 weeks ago for a routine physical, she has gained nearly 8 lbs. She notes that at onset the swelling was worse in the right leg. She reports associated dyspnea after walking on a flat surface. She also reports an occasional cough. She denies any previous episodes of similar swelling. She also denies a PMHx of heart or lung disease, as well as DM, though she did have gestational diabetes while pregnant with both of her children. She reports a FHx of heart disease (mother: has a valve replaced, father: has several stents). She notes she is currently on allergy medication and a vitamin D prescription, due to a PMHx of vitamin D deficiency. She notes she is borderline for thyroid disease, but has never been on any medication for this. She also reports a PSHx of tubal ligation. Pt denies palpitations, chest pain, abdominal pain, and abnormal urination, noting her urine is yellow in color.  Past Medical History  Diagnosis Date  . Allergy   .  Seasonal allergies   . Vitamin D deficiency    Past Surgical History  Procedure Laterality Date  . Hernia repair    . Tubal ligation    . Hernia repair      X2   Family History  Problem Relation Age of Onset  . Heart disease Mother   . Heart disease Father   . Diabetes Paternal Grandfather   . Thyroid disease Neg Hx    Social History  Substance Use Topics  . Smoking status: Never Smoker   . Smokeless tobacco: Never Used  . Alcohol Use: No   OB History    Gravida Para Term Preterm AB TAB SAB Ectopic Multiple Living   2 2        2      Review of Systems  Constitutional: Negative for fever, chills, diaphoresis, appetite change and fatigue.  HENT: Negative for mouth sores, sore throat and trouble swallowing.   Eyes: Negative for visual disturbance.  Respiratory: Positive for cough and shortness of breath. Negative for chest tightness and wheezing.   Cardiovascular: Positive for leg swelling. Negative for chest pain and palpitations.  Gastrointestinal: Positive for abdominal distention. Negative for nausea, vomiting, abdominal pain and diarrhea.  Endocrine: Negative for polydipsia, polyphagia and polyuria.  Genitourinary: Negative for dysuria, frequency, hematuria and difficulty urinating.  Musculoskeletal: Negative for gait problem.  Skin: Negative for color change, pallor and rash.  Neurological: Negative for dizziness,  syncope, light-headedness and headaches.  Hematological: Does not bruise/bleed easily.  Psychiatric/Behavioral: Negative for behavioral problems and confusion.    Allergies  Bee venom  Home Medications   Prior to Admission medications   Medication Sig Start Date End Date Taking? Authorizing Provider  Vitamin D, Ergocalciferol, (DRISDOL) 50000 units CAPS capsule Take 50,000 Units by mouth every 7 (seven) days.   Yes Historical Provider, MD  cephALEXin (KEFLEX) 500 MG capsule Take 1 capsule (500 mg total) by mouth 4 (four) times daily. 06/14/15   [name], MD  DiphenhydrAMINE HCl (BENADRYL PO) Take by mouth.    Historical Provider, MD  EPINEPHrine 0.3 mg/0.3 mL IJ SOAJ injection Reason minister if you have any respiratory distress following an insect sting and call 911 09/01/14   Darlyne Russian, MD  furosemide (LASIX) 20 MG tablet Take 1 tablet (20 mg total) by mouth daily. 06/14/15   [name], MD  IBUPROFEN PO Take by mouth.    Historical Provider, MD  ipratropium (ATROVENT) 0.03 % nasal spray Place 2 sprays into the nose 2 (two) times daily. [id number]  [name], PA-C  loratadine (CLARITIN) 10 MG tablet Take 10 mg by mouth daily.    Historical Provider, MD  potassium chloride (K-DUR) 10 MEQ tablet Take 1 tablet (10 mEq total) by mouth 2 (two) times daily. 06/14/15   [name], MD   BP 113/61 mmHg  Pulse 80  Temp(Src) 98.7 F (37.1 C) (Oral)  Resp 18  Ht 5\' 6"  (1.676 m)  Wt 180 lb (81.647 kg)  BMI 29.07 kg/m2  SpO2 98%  LMP 05/31/2015 Physical Exam  Constitutional: She is oriented to person, place, and time. She appears well-developed and well-nourished. No distress.  HENT:  Head: Normocephalic.  Eyes: Conjunctivae are normal. Pupils are equal, round, and reactive to light. No scleral icterus.  Neck: Normal range of motion. Neck supple. No thyromegaly present.  Cardiovascular: Normal rate and regular rhythm.  Exam reveals no gallop and no friction rub.   No murmur heard. Pulmonary/Chest: Effort normal and breath sounds normal. No respiratory distress. She has no wheezes. She has no rales.  Abdominal: Soft. Bowel sounds are normal. She exhibits no distension. There is no tenderness. There is no rebound.  Musculoskeletal: Normal range of motion. She exhibits edema.  BLE edema up to the knee, right greater than left  Neurological: She is alert and oriented to person, place, and time.  Skin: Skin is warm and dry. No rash noted.  Psychiatric: She has a normal mood and affect. Her behavior is normal.    ED Course   Procedures  DIAGNOSTIC STUDIES: Oxygen Saturation is 100% on RA, normal by my interpretation.    COORDINATION OF CARE: 7:20 PM - Discussed plans to wait on diagnostic studies and imaging. Pt advised of plan for treatment and pt agrees.  Labs Review Labs Reviewed  CBC WITH DIFFERENTIAL/PLATELET - Abnormal; Notable for the following:    Hemoglobin 11.9 (*)    HCT 35.8 (*)    All other components within normal limits  COMPREHENSIVE METABOLIC PANEL - Abnormal; Notable for the following:    Calcium 8.8 (*)    All other components within normal limits  TROPONIN I  BRAIN NATRIURETIC PEPTIDE  D-DIMER, QUANTITATIVE (NOT AT [facility])  PREGNANCY, URINE  URINALYSIS, ROUTINE W REFLEX MICROSCOPIC (NOT AT [facility])  TSH    Imaging Review Dg Chest 2 View  06/14/2015  CLINICAL DATA:  Bilateral leg swelling and shortness of breath  for 2 weeks. EXAM: CHEST  2 VIEW COMPARISON:  05/23/2012 FINDINGS: The cardiomediastinal contours are normal. The lungs are clear. Pulmonary vasculature is normal. No consolidation, pleural effusion, or pneumothorax. No acute osseous abnormalities are seen. IMPRESSION: No acute pulmonary process. Electronically Signed   By: Jeb Levering M.D.   On: 06/14/2015 20:34   Ct Renal Stone Study  06/14/2015  CLINICAL DATA:  Generalized abdominal pain. Approximately 4 months postop from hernia repair. EXAM: CT ABDOMEN AND PELVIS WITHOUT CONTRAST TECHNIQUE: Multidetector CT imaging of the abdomen and pelvis was performed following the standard protocol without IV contrast. COMPARISON:  01/10/2014 FINDINGS: Lower chest:  No acute findings. Hepatobiliary: No mass visualized on this un-enhanced exam. Gallbladder is unremarkable. Pancreas: No mass or inflammatory process identified on this un-enhanced exam. Spleen: Within normal limits in size. Adrenals/Urinary Tract: Normal adrenal glands. No evidence of urolithiasis or hydronephrosis. No bladder calculi identified. Stomach/Bowel: No evidence  of obstruction, inflammatory process, or abnormal fluid collections. Scattered colonic diverticulosis is noted, however there is no evidence of diverticulitis. Normal appendix visualized. Vascular/Lymphatic: No pathologically enlarged lymph nodes. No evidence of abdominal aortic aneurysm. Reproductive: Uterine fibroids again seen, largest in the right fundal region measuring approximately 3.5 cm. A benign-appearingrr cysts is seen in the right adnexa measuring approximately 3.2 x 3.7 cm which is new since previous study. This has benign appearing features and is most likely physiologic in a reproductive age female. Other: There has been interval repair of suprapubic anterior abdominal wall hernia since previous study. Musculoskeletal:  No suspicious bone lesions identified. IMPRESSION: No evidence of urolithiasis or hydronephrosis. New 3.7 cm benign-appearing right ovarian cyst, most likely physiologic in a reproductive age female. Stable uterine fibroids, largest measuring approximately 3.5 cm. Electronically Signed   By: Earle Gell M.D.   On: 06/14/2015 21:35   I have personally reviewed and evaluated these images and lab results as part of my medical decision-making.   EKG Interpretation None      MDM   Final diagnoses:  Dependent edema  Cellulitis of right lower extremity    Able to rule out congestive heart failure with normal exam, x-ray, and BNP. Normal dimer. CT the abdomen does not show space-occupying lesion or compressive abnormalities of the vena cava. Although her symptoms and findings have progressed over 2 weeks having this is supple dependent edema. She does have some asymmetry and some erythema on the right so we will give her a ten-day course of Keflex. Lasix/potassium. Primary care follow-up.  [name], MD 06/14/15 2312

## 2015-06-14 NOTE — ED Notes (Signed)
Patient left without being seen.

## 2015-06-14 NOTE — ED Notes (Signed)
Patient transported to CT 

## 2015-06-15 LAB — TSH: TSH: 1.009 u[IU]/mL (ref 0.350–4.500)

## 2016-01-09 DIAGNOSIS — Z1231 Encounter for screening mammogram for malignant neoplasm of breast: Secondary | ICD-10-CM | POA: Diagnosis not present

## 2016-04-08 DIAGNOSIS — T753XXD Motion sickness, subsequent encounter: Secondary | ICD-10-CM | POA: Diagnosis not present

## 2016-04-22 ENCOUNTER — Encounter: Payer: BLUE CROSS/BLUE SHIELD | Admitting: Women's Health

## 2016-04-28 ENCOUNTER — Encounter: Payer: BLUE CROSS/BLUE SHIELD | Admitting: Women's Health

## 2016-04-29 ENCOUNTER — Ambulatory Visit (INDEPENDENT_AMBULATORY_CARE_PROVIDER_SITE_OTHER): Payer: BLUE CROSS/BLUE SHIELD

## 2016-04-29 ENCOUNTER — Ambulatory Visit (INDEPENDENT_AMBULATORY_CARE_PROVIDER_SITE_OTHER): Payer: BLUE CROSS/BLUE SHIELD | Admitting: Physician Assistant

## 2016-04-29 VITALS — BP 120/76 | HR 92 | Temp 98.5°F | Resp 16 | Ht 64.5 in | Wt 176.8 lb

## 2016-04-29 DIAGNOSIS — R059 Cough, unspecified: Secondary | ICD-10-CM

## 2016-04-29 DIAGNOSIS — R05 Cough: Secondary | ICD-10-CM | POA: Diagnosis not present

## 2016-04-29 DIAGNOSIS — R0600 Dyspnea, unspecified: Secondary | ICD-10-CM | POA: Diagnosis not present

## 2016-04-29 DIAGNOSIS — R0789 Other chest pain: Secondary | ICD-10-CM | POA: Diagnosis not present

## 2016-04-29 MED ORDER — AZITHROMYCIN 250 MG PO TABS: ORAL_TABLET | ORAL | 0 refills | Status: DC

## 2016-04-29 MED ORDER — FLUTICASONE PROPIONATE 50 MCG/ACT NA SUSP: 2.0000 | Freq: Every day | NASAL | 12 refills | Status: DC

## 2016-04-29 MED ORDER — ALBUTEROL SULFATE HFA 108 (90 BASE) MCG/ACT IN AERS: 2.0000 | INHALATION_SPRAY | RESPIRATORY_TRACT | 1 refills | Status: DC | PRN

## 2016-04-29 NOTE — Patient Instructions (Addendum)
I am going to treat you for possible lower respiratory infection. Please do take the mobic daily.  Do not take ibuprofen or naproxen with this.  You are able to take tylenol.   Please let me know if your symptoms do not improve within 10 days.    IF you received an x-ray today, you will receive an invoice from [facility]. Please contact [facility] at [phone] with questions or concerns regarding your invoice.   IF you received labwork today, you will receive an invoice from [facility]. Please contact [facility] at [phone] with questions or concerns regarding your invoice.   Our billing staff will not be able to assist you with questions regarding bills from these companies.  You will be contacted with the lab results as soon as they are available. The fastest way to get your results is to activate your My Chart account. Instructions are located on the last page of this paperwork. If you have not heard from [place] regarding the results in 2 weeks, please contact this office.

## 2016-04-29 NOTE — Progress Notes (Signed)
[facility] [address], [place] [address] [phone]  Date:  04/29/2016   Name:  Dana Rojas   DOB:  June 21, 1970   MRN:  [id number]  PCP:  Gennette Pac, MD    History of Present Illness:  Dana Rojas is a 46 y.o. female patient who presents to [facility] for cc of chest pain and back pain.  Back pain and chest pain started 2 weeks ago.   Shke has subjective fever and chills.  When she breathes and walks her back and chest hurt.  She feels winded with movmeent.  Dry coughing which is worse at night.  She hears herself wheezing at time.  2 weeks of lower leg pain of the left hip.  No hx of blood clots.  No birth controlled.  Tubal ligation.  She has no nasal congestion or sore throat.  There is some hoarseness that comes and goes.  No watery eyes or sneezing.    She has been using a vaporizer to attempt to help her symtpoms.  She is not chekcing her temperature.  Robitussin, claritin, benadryl, theraflu.  She started back with claritin for the last week.   Chief Complaint  Patient presents with  . Chest Pain    X 2 WEEKS  . Back Pain    UPPER    Patient Active Problem List   Diagnosis Date Noted  . Hyperthyroidism 12/20/2014  . Intramural leiomyoma of uterus 02/02/2014  . Menorrhagia with regular cycle 02/02/2014  . DYSLIPIDEMIA 08/12/2009  . OBESITY 08/12/2009  . GESTATIONAL DIABETES 08/12/2009  . SCOLIOSIS 08/12/2009  . HEADACHE 08/12/2009  . CHICKENPOX, HX OF 08/12/2009    Past Medical History:  Diagnosis Date  . Allergy   . Seasonal allergies   . Vitamin D deficiency     Past Surgical History:  Procedure Laterality Date  . HERNIA REPAIR    . HERNIA REPAIR     X2  . TUBAL LIGATION      Social History  Substance Use Topics  . Smoking status: Never Smoker  . Smokeless tobacco: Never Used  . Alcohol use No    Family History  Problem Relation Age of Onset  . Heart disease Mother   . Heart disease Father   . Diabetes  Paternal Grandfather   . Thyroid disease Neg Hx     Allergies  Allergen Reactions  . Bee Venom     Body swelling    Medication list has been reviewed and updated.  Current Outpatient Prescriptions on File Prior to Visit  Medication Sig Dispense Refill  . DiphenhydrAMINE HCl (BENADRYL PO) Take by mouth.    . EPINEPHrine 0.3 mg/0.3 mL IJ SOAJ injection Reason minister if you have any respiratory distress following an insect sting and call 911 1 Device 2  . IBUPROFEN PO Take by mouth.    . loratadine (CLARITIN) 10 MG tablet Take 10 mg by mouth daily.    . furosemide (LASIX) 20 MG tablet Take 1 tablet (20 mg total) by mouth daily. (Patient not taking: Reported on 04/29/2016) 10 tablet 0  . ipratropium (ATROVENT) 0.03 % nasal spray Place 2 sprays into the nose 2 (two) times daily. 30 mL 0  . potassium chloride (K-DUR) 10 MEQ tablet Take 1 tablet (10 mEq total) by mouth 2 (two) times daily. (Patient not taking: Reported on 04/29/2016) 10 tablet 0  . Vitamin D, Ergocalciferol, (DRISDOL) 50000 units CAPS capsule Take 50,000 Units by mouth every  7 (seven) days.     No current facility-administered medications on file prior to visit.     ROS ROS otherwise unremarkable unless listed above.  Physical Examination: BP 120/76 (BP Location: Right Arm, Patient Position: Sitting, Cuff Size: Normal)   Pulse 92   Temp 98.5 F (36.9 C) (Oral)   Resp 16   Ht 5' 4.5" (1.638 m)   Wt 176 lb 12.8 oz (80.2 kg)   LMP 04/20/2016   SpO2 98%   BMI 29.88 kg/m  Ideal Body Weight: Weight in (lb) to have BMI = 25: 147.6  Physical Exam  Constitutional: She is oriented to person, place, and time. She appears well-developed and well-nourished. No distress.  HENT:  Head: Normocephalic and atraumatic.  Right Ear: Tympanic membrane, external ear and ear canal normal.  Left Ear: Tympanic membrane, external ear and ear canal normal.  Nose: Mucosal edema and rhinorrhea present. Right sinus exhibits no maxillary  sinus tenderness and no frontal sinus tenderness. Left sinus exhibits no maxillary sinus tenderness and no frontal sinus tenderness.  Mouth/Throat: No uvula swelling. No oropharyngeal exudate, posterior oropharyngeal edema or posterior oropharyngeal erythema.  Eyes: Conjunctivae and EOM are normal. Pupils are equal, round, and reactive to light.  Cardiovascular: Normal rate and regular rhythm.  Exam reveals no gallop, no distant heart sounds and no friction rub.   No murmur heard. Pulses:      Radial pulses are 2+ on the right side, and 2+ on the left side.       Dorsalis pedis pulses are 2+ on the right side, and 2+ on the left side.  Pulmonary/Chest: Effort normal. No apnea. No respiratory distress. She has no decreased breath sounds. She has no wheezes. She has no rhonchi. She exhibits bony tenderness (tender along the center sternum upon palpation). She exhibits no tenderness and no crepitus.  Lymphadenopathy:       Head (right side): No submandibular, no tonsillar, no preauricular and no posterior auricular adenopathy present.       Head (left side): No submandibular, no tonsillar, no preauricular and no posterior auricular adenopathy present.  Neurological: She is alert and oriented to person, place, and time.  Skin: Capillary refill takes less than 2 seconds. She is not diaphoretic.  Psychiatric: She has a normal mood and affect. Her behavior is normal.     Assessment and Plan: Dana Rojas is a 46 y.o. female who is here today for chest and back pain. Possible lower respiratory infection vs chest wall inflammation secondary to cough. Treating supportively.  Advised of alarming sxs to warrant an immediate return. Dyspnea, unspecified type - Plan: DG Chest 2 View, albuterol (PROVENTIL HFA;VENTOLIN HFA) 108 (90 Base) MCG/ACT inhaler  Chest wall pain - Plan: fluticasone (FLONASE) 50 MCG/ACT nasal spray, azithromycin (ZITHROMAX) 250 MG tablet, albuterol (PROVENTIL HFA;VENTOLIN HFA)  108 (90 Base) MCG/ACT inhaler, meloxicam (MOBIC) 15 MG tablet  Coughing - Plan: fluticasone (FLONASE) 50 MCG/ACT nasal spray, azithromycin (ZITHROMAX) 250 MG tablet, albuterol (PROVENTIL HFA;VENTOLIN HFA) 108 (90 Base) MCG/ACT inhaler, benzonatate (TESSALON) 100 MG capsule  Ivar Drape, PA-C [facility] 3/8/20188:34 AM

## 2016-04-30 ENCOUNTER — Ambulatory Visit (INDEPENDENT_AMBULATORY_CARE_PROVIDER_SITE_OTHER): Payer: BLUE CROSS/BLUE SHIELD | Admitting: Women's Health

## 2016-04-30 ENCOUNTER — Encounter: Payer: Self-pay | Admitting: Women's Health

## 2016-04-30 ENCOUNTER — Encounter: Payer: Self-pay | Admitting: Physician Assistant

## 2016-04-30 VITALS — BP 120/82 | Ht 62.5 in | Wt 175.0 lb

## 2016-04-30 DIAGNOSIS — N852 Hypertrophy of uterus: Secondary | ICD-10-CM

## 2016-04-30 DIAGNOSIS — R1032 Left lower quadrant pain: Secondary | ICD-10-CM

## 2016-04-30 DIAGNOSIS — N920 Excessive and frequent menstruation with regular cycle: Secondary | ICD-10-CM

## 2016-04-30 DIAGNOSIS — Z01419 Encounter for gynecological examination (general) (routine) without abnormal findings: Secondary | ICD-10-CM | POA: Diagnosis not present

## 2016-04-30 MED ORDER — BENZONATATE 100 MG PO CAPS: 100.0000 mg | ORAL_CAPSULE | Freq: Three times a day (TID) | ORAL | 0 refills | Status: DC | PRN

## 2016-04-30 MED ORDER — TRANEXAMIC ACID 650 MG PO TABS: ORAL_TABLET | ORAL | 6 refills | Status: DC

## 2016-04-30 MED ORDER — MELOXICAM 15 MG PO TABS: 15.0000 mg | ORAL_TABLET | Freq: Every day | ORAL | 1 refills | Status: DC

## 2016-04-30 NOTE — Progress Notes (Signed)
Dana Rojas 12/27/1970 [id number]    History:    Presents for annual exam.  Monthly 7 day heavy cycle/BTL.Marland Kitchen Reports changing protection every 1 hour first couple of days of cycle. Normal Pap and mammogram history. History of small fibroids. History of GDM.  Past medical history, past surgical history, family history and social history were all reviewed and documented in the EPIC chart. Works at a [employer]. Children are ages 41 and 76 both doing well.  ROS:  A ROS was performed and pertinent positives and negatives are included.  Exam:  Vitals:   04/30/16 1444  BP: 120/82  Weight: 175 lb (79.4 kg)  Height: 5' 2.5" (1.588 m)   Body mass index is 31.5 kg/m.   General appearance:  Normal Thyroid:  Symmetrical, normal in size, without palpable masses or nodularity. Respiratory  Auscultation:  Clear without wheezing or rhonchi Cardiovascular  Auscultation:  Regular rate, without rubs, murmurs or gallops  Edema/varicosities:  Not grossly evident Abdominal  Soft,nontender, without masses, guarding or rebound.  Liver/spleen:  No organomegaly noted  Hernia:  None appreciated  Skin  Inspection:  Grossly normal   Breasts: Examined lying and sitting.     Right: Without masses, retractions, discharge or axillary adenopathy.     Left: Without masses, retractions, discharge or axillary adenopathy. Gentitourinary   Inguinal/mons:  Normal without inguinal adenopathy  External genitalia:  Normal  BUS/Urethra/Skene's glands:  Normal  Vagina:  Normal  Cervix:  Normal  Uterus:   normal in size, shape and contour.  Midline and mobile  Adnexa/parametria:     Rt: Without masses or tenderness.   Lt: Without masses or tenderness.  Anus and perineum: Normal  Digital rectal exam: Normal sphincter tone without palpated masses or tenderness  Assessment/Plan:  46 y.o. MBF G2 P2  for annual exam.    Monthly 7 day cycle/menorrhagia/BTL Fibroid uterus Labs-primary care  Plan: Options  for menorrhagia reviewed will try Lysteda 150 mg by mouth 3 times a day a day for 5 days with cycles. Schedule sonohysterogram with Dr. Toney Rakes after next cycle. Reviewed ablation, may be a good option for menorrhagia reviewed risks of infection, perforation. Information given. SBE's, continue annual 3-D screening mammogram history of dense breasts. Encouraged increased exercise, decrease calories for weight loss. Vitamin D 2000 daily. Reports normal labs at primary care. UA, Pap normal 10/2014 with negative HR HPV.    Huel Cote [facility], 4:48 PM 04/30/2016

## 2016-04-30 NOTE — Patient Instructions (Signed)
Menorrhagia Menorrhagia is when your menstrual periods are heavy or last longer than usual. Follow these instructions at home:  Only take medicine as told by your doctor.  Take any iron pills as told by your doctor. Heavy bleeding may cause low levels of iron in your body.  Do not take aspirin 1 week before or during your period. Aspirin can make the bleeding worse.  Lie down for a while if you change your tampon or pad more than once in 2 hours. This may help lessen the bleeding.  Eat a healthy diet and foods with iron. These foods include leafy green vegetables, meat, liver, eggs, and whole grain breads and cereals.  Do not try to lose weight. Wait until the heavy bleeding has stopped and your iron level is normal. Contact a doctor if:  You soak through a pad or tampon every 1 or 2 hours, and this happens every time you have a period.  You need to use pads and tampons at the same time because you are bleeding so much.  You need to change your pad or tampon during the night.  You have a period that lasts for more than 8 days.  You pass clots bigger than 1 inch (2.5 cm) wide.  You have irregular periods that happen more or less often than once a month.  You feel dizzy or pass out (faint).  You feel very weak or tired.  You feel short of breath or feel your heart is beating too fast when you exercise.  You feel sick to your stomach (nausea) and you throw up (vomit) while you are taking your medicine.  You have watery poop (diarrhea) while you are taking your medicine.  You have any problems that may be related to the medicine you are taking. Get help right away if:  You soak through 4 or more pads or tampons in 2 hours.  You have any bleeding while you are pregnant. This information is not intended to replace advice given to you by your health care provider. Make sure you discuss any questions you have with your health care provider. Document Released: 11/19/2007 Document  Revised: 07/18/2015 Document Reviewed: 08/11/2012 Elsevier Interactive Patient Education  2017 [place] Maintenance, Female Adopting a healthy lifestyle and getting preventive care can go a long way to promote health and wellness. Talk with your health care provider about what schedule of regular examinations is right for you. This is a good chance for you to check in with your provider about disease prevention and staying healthy. In between checkups, there are plenty of things you can do on your own. Experts have done a lot of research about which lifestyle changes and preventive measures are most likely to keep you healthy. Ask your health care provider for more information. Weight and diet Eat a healthy diet  Be sure to include plenty of vegetables, fruits, low-fat dairy products, and lean protein.  Do not eat a lot of foods high in solid fats, added sugars, or salt.  Get regular exercise. This is one of the most important things you can do for your health.  Most adults should exercise for at least 150 minutes each week. The exercise should increase your heart rate and make you sweat (moderate-intensity exercise).  Most adults should also do strengthening exercises at least twice a week. This is in addition to the moderate-intensity exercise. Maintain a healthy weight  Body mass index (BMI) is a measurement that can be used to  identify possible weight problems. It estimates body fat based on height and weight. Your health care provider can help determine your BMI and help you achieve or maintain a healthy weight.  For females 63 years of age and older:  A BMI below 18.5 is considered underweight.  A BMI of 18.5 to 24.9 is normal.  A BMI of 25 to 29.9 is considered overweight.  A BMI of 30 and above is considered obese. Watch levels of cholesterol and blood lipids  You should start having your blood tested for lipids and cholesterol at 46 years of age, then have this  test every 5 years.  You may need to have your cholesterol levels checked more often if:  Your lipid or cholesterol levels are high.  You are older than 46 years of age.  You are at high risk for heart disease. Cancer screening Lung Cancer  Lung cancer screening is recommended for adults 71-28 years old who are at high risk for lung cancer because of a history of smoking.  A yearly low-dose CT scan of the lungs is recommended for people who:  Currently smoke.  Have quit within the past 15 years.  Have at least a 30-pack-year history of smoking. A pack year is smoking an average of one pack of cigarettes a day for 1 year.  Yearly screening should continue until it has been 15 years since you quit.  Yearly screening should stop if you develop a health problem that would prevent you from having lung cancer treatment. Breast Cancer  Practice breast self-awareness. This means understanding how your breasts normally appear and feel.  It also means doing regular breast self-exams. Let your health care provider know about any changes, no matter how small.  If you are in your 20s or 30s, you should have a clinical breast exam (CBE) by a health care provider every 1-3 years as part of a regular health exam.  If you are 65 or older, have a CBE every year. Also consider having a breast X-ray (mammogram) every year.  If you have a family history of breast cancer, talk to your health care provider about genetic screening.  If you are at high risk for breast cancer, talk to your health care provider about having an MRI and a mammogram every year.  Breast cancer gene (BRCA) assessment is recommended for women who have family members with BRCA-related cancers. BRCA-related cancers include:  Breast.  Ovarian.  Tubal.  Peritoneal cancers.  Results of the assessment will determine the need for genetic counseling and BRCA1 and BRCA2 testing. Cervical Cancer  Your health care provider  may recommend that you be screened regularly for cancer of the pelvic organs (ovaries, uterus, and vagina). This screening involves a pelvic examination, including checking for microscopic changes to the surface of your cervix (Pap test). You may be encouraged to have this screening done every 3 years, beginning at age 58.  For women ages 13-65, health care providers may recommend pelvic exams and Pap testing every 3 years, or they may recommend the Pap and pelvic exam, combined with testing for human papilloma virus (HPV), every 5 years. Some types of HPV increase your risk of cervical cancer. Testing for HPV may also be done on women of any age with unclear Pap test results.  Other health care providers may not recommend any screening for nonpregnant women who are considered low risk for pelvic cancer and who do not have symptoms. Ask your health care provider if  a screening pelvic exam is right for you.  If you have had past treatment for cervical cancer or a condition that could lead to cancer, you need Pap tests and screening for cancer for at least 20 years after your treatment. If Pap tests have been discontinued, your risk factors (such as having a new sexual partner) need to be reassessed to determine if screening should resume. Some women have medical problems that increase the chance of getting cervical cancer. In these cases, your health care provider may recommend more frequent screening and Pap tests. Colorectal Cancer  This type of cancer can be detected and often prevented.  Routine colorectal cancer screening usually begins at 46 years of age and continues through 46 years of age.  Your health care provider may recommend screening at an earlier age if you have risk factors for colon cancer.  Your health care provider may also recommend using home test kits to check for hidden blood in the stool.  A small camera at the end of a tube can be used to examine your colon directly  (sigmoidoscopy or colonoscopy). This is done to check for the earliest forms of colorectal cancer.  Routine screening usually begins at age 15.  Direct examination of the colon should be repeated every 5-10 years through 46 years of age. However, you may need to be screened more often if early forms of precancerous polyps or small growths are found. Skin Cancer  Check your skin from head to toe regularly.  Tell your health care provider about any new moles or changes in moles, especially if there is a change in a mole's shape or color.  Also tell your health care provider if you have a mole that is larger than the size of a pencil eraser.  Always use sunscreen. Apply sunscreen liberally and repeatedly throughout the day.  Protect yourself by wearing long sleeves, pants, a wide-brimmed hat, and sunglasses whenever you are outside. Heart disease, diabetes, and high blood pressure  High blood pressure causes heart disease and increases the risk of stroke. High blood pressure is more likely to develop in:  People who have blood pressure in the high end of the normal range (130-139/85-89 mm Hg).  People who are overweight or obese.  People who are African American.  If you are 42-26 years of age, have your blood pressure checked every 3-5 years. If you are 62 years of age or older, have your blood pressure checked every year. You should have your blood pressure measured twice-once when you are at a hospital or clinic, and once when you are not at a hospital or clinic. Record the average of the two measurements. To check your blood pressure when you are not at a hospital or clinic, you can use:  An automated blood pressure machine at a pharmacy.  A home blood pressure monitor.  If you are between 27 years and 63 years old, ask your health care provider if you should take aspirin to prevent strokes.  Have regular diabetes screenings. This involves taking a blood sample to check your  fasting blood sugar level.  If you are at a normal weight and have a low risk for diabetes, have this test once every three years after 46 years of age.  If you are overweight and have a high risk for diabetes, consider being tested at a younger age or more often. Preventing infection Hepatitis B  If you have a higher risk for hepatitis B, you should  be screened for this virus. You are considered at high risk for hepatitis B if:  You were born in a country where hepatitis B is common. Ask your health care provider which countries are considered high risk.  Your parents were born in a high-risk country, and you have not been immunized against hepatitis B (hepatitis B vaccine).  You have HIV or AIDS.  You use needles to inject street drugs.  You live with someone who has hepatitis B.  You have had sex with someone who has hepatitis B.  You get hemodialysis treatment.  You take certain medicines for conditions, including cancer, organ transplantation, and autoimmune conditions. Hepatitis C  Blood testing is recommended for:  Everyone born from [birth year] through 1965.  Anyone with known risk factors for hepatitis C. Sexually transmitted infections (STIs)  You should be screened for sexually transmitted infections (STIs) including gonorrhea and chlamydia if:  You are sexually active and are younger than 46 years of age.  You are older than 47 years of age and your health care provider tells you that you are at risk for this type of infection.  Your sexual activity has changed since you were last screened and you are at an increased risk for chlamydia or gonorrhea. Ask your health care provider if you are at risk.  If you do not have HIV, but are at risk, it may be recommended that you take a prescription medicine daily to prevent HIV infection. This is called pre-exposure prophylaxis (PrEP). You are considered at risk if:  You are sexually active and do not regularly use condoms or  know the HIV status of your partner(s).  You take drugs by injection.  You are sexually active with a partner who has HIV. Talk with your health care provider about whether you are at high risk of being infected with HIV. If you choose to begin PrEP, you should first be tested for HIV. You should then be tested every 3 months for as long as you are taking PrEP. Pregnancy  If you are premenopausal and you may become pregnant, ask your health care provider about preconception counseling.  If you may become pregnant, take 400 to 800 micrograms (mcg) of folic acid every day.  If you want to prevent pregnancy, talk to your health care provider about birth control (contraception). Osteoporosis and menopause  Osteoporosis is a disease in which the bones lose minerals and strength with aging. This can result in serious bone fractures. Your risk for osteoporosis can be identified using a bone density scan.  If you are 42 years of age or older, or if you are at risk for osteoporosis and fractures, ask your health care provider if you should be screened.  Ask your health care provider whether you should take a calcium or vitamin D supplement to lower your risk for osteoporosis.  Menopause may have certain physical symptoms and risks.  Hormone replacement therapy may reduce some of these symptoms and risks. Talk to your health care provider about whether hormone replacement therapy is right for you. Follow these instructions at home:  Schedule regular health, dental, and eye exams.  Stay current with your immunizations.  Do not use any tobacco products including cigarettes, chewing tobacco, or electronic cigarettes.  If you are pregnant, do not drink alcohol.  If you are breastfeeding, limit how much and how often you drink alcohol.  Limit alcohol intake to no more than 1 drink per day for nonpregnant women. One drink equals  12 ounces of beer, 5 ounces of wine, or 1 ounces of hard  liquor.  Do not use street drugs.  Do not share needles.  Ask your health care provider for help if you need support or information about quitting drugs.  Tell your health care provider if you often feel depressed.  Tell your health care provider if you have ever been abused or do not feel safe at home. This information is not intended to replace advice given to you by your health care provider. Make sure you discuss any questions you have with your health care provider. Document Released: 08/25/2010 Document Revised: 07/18/2015 Document Reviewed: 11/13/2014 Elsevier Interactive Patient Education  2017 [place]. Endometrial Ablation Endometrial ablation is a procedure that destroys the thin inner layer of the lining of the uterus (endometrium). This procedure may be done:  To stop heavy periods.  To stop bleeding that is causing anemia.  To control irregular bleeding.  To treat bleeding caused by small tumors (fibroids) in the endometrium. This procedure is often an alternative to major surgery, such as removal of the uterus and cervix (hysterectomy). As a result of this procedure:  You may not be able to have children. However, if you are premenopausal (you have not gone through menopause):  You may still have a small chance of getting pregnant.  You will need to use a reliable method of birth control after the procedure to prevent pregnancy.  You may stop having a menstrual period, or you may have only a small amount of bleeding during your period. Menstruation may return several years after the procedure. Tell a health care provider about:  Any allergies you have.  All medicines you are taking, including vitamins, herbs, eye drops, creams, and over-the-counter medicines.  Any problems you or family members have had with the use of anesthetic medicines.  Any blood disorders you have.  Any surgeries you have had.  Any medical conditions you have. What are the  risks? Generally, this is a safe procedure. However, problems may occur, including:  A hole (perforation) in the uterus or bowel.  Infection of the uterus, bladder, or vagina.  Bleeding.  Damage to other structures or organs.  An air bubble in the lung (air embolus).  Problems with pregnancy after the procedure.  Failure of the procedure.  Decreased ability to diagnose cancer in the endometrium. What happens before the procedure?  You will have tests of your endometrium to make sure there are no pre-cancerous cells or cancer cells present.  You may have an ultrasound of the uterus.  You may be given medicines to thin the endometrium.  Ask your health care provider about:  Changing or stopping your regular medicines. This is especially important if you take diabetes medicines or blood thinners.  Taking medicines such as aspirin and ibuprofen. These medicines can thin your blood. Do not take these medicines before your procedure if your doctor tells you not to.  Plan to have someone take you home from the hospital or clinic. What happens during the procedure?  You will lie on an exam table with your feet and legs supported as in a pelvic exam.  To lower your risk of infection:  Your health care team will wash or sanitize their hands and put on germ-free (sterile) gloves.  Your genital area will be washed with soap.  An IV tube will be inserted into one of your veins.  You will be given a medicine to help you relax (sedative).  A surgical instrument with a light and camera (resectoscope) will be inserted into your vagina and moved into your uterus. This allows your surgeon to see inside your uterus.  Endometrial tissue will be removed using one of the following methods:  Radiofrequency. This method uses a radiofrequency-alternating electric current to remove the endometrium.  Cryotherapy. This method uses extreme cold to freeze the endometrium.  Heated-free liquid.  This method uses a heated saltwater (saline) solution to remove the endometrium.  Microwave. This method uses high-energy microwaves to heat up the endometrium and remove it.  Thermal balloon. This method involves inserting a catheter with a balloon tip into the uterus. The balloon tip is filled with heated fluid to remove the endometrium. The procedure may vary among health care providers and hospitals. What happens after the procedure?  Your blood pressure, heart rate, breathing rate, and blood oxygen level will be monitored until the medicines you were given have worn off.  As tissue healing occurs, you may notice vaginal bleeding for 4-6 weeks after the procedure. You may also experience:  Cramps.  Thin, watery vaginal discharge that is light pink or brown in color.  A need to urinate more frequently than usual.  Nausea.  Do not drive for 24 hours if you were given a sedative.  Do not have sex or insert anything into your vagina until your health care provider approves. Summary  Endometrial ablation is done to treat the many causes of heavy menstrual bleeding.  The procedure may be done only after medications have been tried to control the bleeding.  Plan to have someone take you home from the hospital or clinic. This information is not intended to replace advice given to you by your health care provider. Make sure you discuss any questions you have with your health care provider. Document Released: 12/20/2003 Document Revised: 02/27/2016 Document Reviewed: 02/27/2016 Elsevier Interactive Patient Education  2017 [employer].

## 2016-05-04 ENCOUNTER — Other Ambulatory Visit: Payer: Self-pay | Admitting: Gynecology

## 2016-05-04 DIAGNOSIS — N920 Excessive and frequent menstruation with regular cycle: Secondary | ICD-10-CM

## 2016-05-04 DIAGNOSIS — N939 Abnormal uterine and vaginal bleeding, unspecified: Secondary | ICD-10-CM

## 2016-05-05 ENCOUNTER — Telehealth: Payer: Self-pay | Admitting: Physician Assistant

## 2016-05-05 NOTE — Telephone Encounter (Signed)
PATIENT STATES SHE CAME IN ON Thursday AND SHE SAW STEPHANIE ENGLISH. SHE DIAGNOSED HER WITH BRONCHITIS AND URI. SHE THOUGHT STEPHANIE WAS GOING TO GIVE HER 10 DAYS OF THE ZITHROMAX 250 MG, BUT SHE ONLY GOT ABOUT 5 DAYS. SHE FINISHED IT ON Sunday. SHE STILL HAS THE EXACT SAME SYMPTOMS SHE ALREADY HAD. BEST PHONE [phone] (CELL) PHARMACY CHOICE IS [facility] ON [address]. [place]

## 2016-05-05 NOTE — Telephone Encounter (Signed)
Spoke with patient she was advised to come ten days and to continue albuterol and mucinex , any shortness of breath needs to be seen right away.   Patient understood.

## 2016-05-06 DIAGNOSIS — R0789 Other chest pain: Secondary | ICD-10-CM | POA: Diagnosis not present

## 2016-05-06 DIAGNOSIS — J22 Unspecified acute lower respiratory infection: Secondary | ICD-10-CM | POA: Diagnosis not present

## 2016-05-06 DIAGNOSIS — R072 Precordial pain: Secondary | ICD-10-CM | POA: Diagnosis not present

## 2016-05-13 ENCOUNTER — Ambulatory Visit: Payer: BLUE CROSS/BLUE SHIELD | Admitting: Gynecology

## 2016-05-13 ENCOUNTER — Other Ambulatory Visit: Payer: Self-pay | Admitting: Gynecology

## 2016-05-13 ENCOUNTER — Other Ambulatory Visit: Payer: BLUE CROSS/BLUE SHIELD

## 2016-05-13 DIAGNOSIS — N939 Abnormal uterine and vaginal bleeding, unspecified: Secondary | ICD-10-CM

## 2016-05-13 DIAGNOSIS — R079 Chest pain, unspecified: Secondary | ICD-10-CM | POA: Diagnosis not present

## 2016-05-13 DIAGNOSIS — D251 Intramural leiomyoma of uterus: Secondary | ICD-10-CM

## 2016-05-13 DIAGNOSIS — R946 Abnormal results of thyroid function studies: Secondary | ICD-10-CM | POA: Diagnosis not present

## 2016-05-13 DIAGNOSIS — R945 Abnormal results of liver function studies: Secondary | ICD-10-CM | POA: Diagnosis not present

## 2016-05-13 DIAGNOSIS — E559 Vitamin D deficiency, unspecified: Secondary | ICD-10-CM | POA: Diagnosis not present

## 2016-07-08 ENCOUNTER — Encounter: Payer: Self-pay | Admitting: Gynecology

## 2016-08-05 ENCOUNTER — Ambulatory Visit (INDEPENDENT_AMBULATORY_CARE_PROVIDER_SITE_OTHER): Payer: BLUE CROSS/BLUE SHIELD | Admitting: Gynecology

## 2016-08-05 ENCOUNTER — Encounter: Payer: Self-pay | Admitting: Gynecology

## 2016-08-05 ENCOUNTER — Ambulatory Visit (INDEPENDENT_AMBULATORY_CARE_PROVIDER_SITE_OTHER): Payer: BLUE CROSS/BLUE SHIELD

## 2016-08-05 DIAGNOSIS — D251 Intramural leiomyoma of uterus: Secondary | ICD-10-CM | POA: Diagnosis not present

## 2016-08-05 DIAGNOSIS — N920 Excessive and frequent menstruation with regular cycle: Secondary | ICD-10-CM | POA: Diagnosis not present

## 2016-08-05 DIAGNOSIS — M5432 Sciatica, left side: Secondary | ICD-10-CM | POA: Insufficient documentation

## 2016-08-05 DIAGNOSIS — N939 Abnormal uterine and vaginal bleeding, unspecified: Secondary | ICD-10-CM

## 2016-08-05 NOTE — Patient Instructions (Signed)
Sciatica Sciatica is pain, numbness, weakness, or tingling along the path of the sciatic nerve. The sciatic nerve starts in the lower back and runs down the back of each leg. The nerve controls the muscles in the lower leg and in the back of the knee. It also provides feeling (sensation) to the back of the thigh, the lower leg, and the sole of the foot. Sciatica is a symptom of another medical condition that pinches or puts pressure on the sciatic nerve. Generally, sciatica only affects one side of the body. Sciatica usually goes away on its own or with treatment. In some cases, sciatica may keep coming back (recur). What are the causes? This condition is caused by pressure on the sciatic nerve, or pinching of the sciatic nerve. This may be the result of:  A disk in between the bones of the spine (vertebrae) bulging out too far (herniated disk).  Age-related changes in the spinal disks (degenerative disk disease).  A pain disorder that affects a muscle in the buttock (piriformis syndrome).  Extra bone growth (bone spur) near the sciatic nerve.  An injury or break (fracture) of the pelvis.  Pregnancy.  Tumor (rare). What increases the risk? The following factors may make you more likely to develop this condition:  Playing sports that place pressure or stress on the spine, such as football or weight lifting.  Having poor strength and flexibility.  A history of back injury.  A history of back surgery.  Sitting for long periods of time.  Doing activities that involve repetitive bending or lifting.  Obesity. What are the signs or symptoms? Symptoms can vary from mild to very severe, and they may include:  Any of these problems in the lower back, leg, hip, or buttock:  Mild tingling or dull aches.  Burning sensations.  Sharp pains.  Numbness in the back of the calf or the sole of the foot.  Leg weakness.  Severe back pain that makes movement difficult. These symptoms may  get worse when you cough, sneeze, or laugh, or when you sit or stand for long periods of time. Being overweight may also make symptoms worse. In some cases, symptoms may recur over time. How is this diagnosed? This condition may be diagnosed based on:  Your symptoms.  A physical exam. Your health care provider may ask you to do certain movements to check whether those movements trigger your symptoms.  You may have tests, including:  Blood tests.  X-rays.  MRI.  CT scan. How is this treated? In many cases, this condition improves on its own, without any treatment. However, treatment may include:  Reducing or modifying physical activity during periods of pain.  Exercising and stretching to strengthen your abdomen and improve the flexibility of your spine.  Icing and applying heat to the affected area.  Medicines that help:  To relieve pain and swelling.  To relax your muscles.  Injections of medicines that help to relieve pain, irritation, and inflammation around the sciatic nerve (steroids).  Surgery. Follow these instructions at home: Medicines   Take over-the-counter and prescription medicines only as told by your health care provider.  Do not drive or operate heavy machinery while taking prescription pain medicine. Managing pain   If directed, apply ice to the affected area.  Put ice in a plastic bag.  Place a towel between your skin and the bag.  Leave the ice on for 20 minutes, 2-3 times a day.  After icing, apply heat to the   heat to the affected area before you exercise or as often as told by your health care provider. Use the heat source that your health care provider recommends, such as a moist heat pack or a heating pad. ? Place a towel between your skin and the heat source. ? Leave the heat on for 20-30 minutes. ? Remove the heat if your skin turns bright red. This is especially important if you are unable to feel pain, heat, or cold. You may have a  greater risk of getting burned. Activity  Return to your normal activities as told by your health care provider. Ask your health care provider what activities are safe for you. ? Avoid activities that make your symptoms worse.  Take brief periods of rest throughout the day. Resting in a lying or standing position is usually better than sitting to rest. ? When you rest for longer periods, mix in some mild activity or stretching between periods of rest. This will help to prevent stiffness and pain. ? Avoid sitting for long periods of time without moving. Get up and move around at least one time each hour.  Exercise and stretch regularly, as told by your health care provider.  Do not lift anything that is heavier than 10 lb (4.5 kg) while you have symptoms of sciatica. When you do not have symptoms, you should still avoid heavy lifting, especially repetitive heavy lifting.  When you lift objects, always use proper lifting technique, which includes: ? Bending your knees. ? Keeping the load close to your body. ? Avoiding twisting. General instructions  Use good posture. ? Avoid leaning forward while sitting. ? Avoid hunching over while standing.  Maintain a healthy weight. Excess weight puts extra stress on your back and makes it difficult to maintain good posture.  Wear supportive, comfortable shoes. Avoid wearing high heels.  Avoid sleeping on a mattress that is too soft or too hard. A mattress that is firm enough to support your back when you sleep may help to reduce your pain.  Keep all follow-up visits as told by your health care provider. This is important. Contact a health care provider if:  You have pain that wakes you up when you are sleeping.  You have pain that gets worse when you lie down.  Your pain is worse than you have experienced in the past.  Your pain lasts longer than 4 weeks.  You experience unexplained weight loss. Get help right away if:  You lose control  of your bowel or bladder (incontinence).  You have: ? Weakness in your lower back, pelvis, buttocks, or legs that gets worse. ? Redness or swelling of your back. ? A burning sensation when you urinate. This information is not intended to replace advice given to you by your health care provider. Make sure you discuss any questions you have with your health care provider. Document Released: 02/03/2001 Document Revised: 07/16/2015 Document Reviewed: 10/19/2014 Elsevier Interactive Patient Education  2017 [employer]. Endometrial Ablation Endometrial ablation is a procedure that destroys the thin inner layer of the lining of the uterus (endometrium). This procedure may be done:  To stop heavy periods.  To stop bleeding that is causing anemia.  To control irregular bleeding.  To treat bleeding caused by small tumors (fibroids) in the endometrium.  This procedure is often an alternative to major surgery, such as removal of the uterus and cervix (hysterectomy). As a result of this procedure:  You may not be able to have children. However,  if you are premenopausal (you have not gone through menopause): ? You may still have a small chance of getting pregnant. ? You will need to use a reliable method of birth control after the procedure to prevent pregnancy.  You may stop having a menstrual period, or you may have only a small amount of bleeding during your period. Menstruation may return several years after the procedure.  Tell a health care provider about:  Any allergies you have.  All medicines you are taking, including vitamins, herbs, eye drops, creams, and over-the-counter medicines.  Any problems you or family members have had with the use of anesthetic medicines.  Any blood disorders you have.  Any surgeries you have had.  Any medical conditions you have. What are the risks? Generally, this is a safe procedure. However, problems may occur, including:  A hole (perforation)  in the uterus or bowel.  Infection of the uterus, bladder, or vagina.  Bleeding.  Damage to other structures or organs.  An air bubble in the lung (air embolus).  Problems with pregnancy after the procedure.  Failure of the procedure.  Decreased ability to diagnose cancer in the endometrium.  What happens before the procedure?  You will have tests of your endometrium to make sure there are no pre-cancerous cells or cancer cells present.  You may have an ultrasound of the uterus.  You may be given medicines to thin the endometrium.  Ask your health care provider about: ? Changing or stopping your regular medicines. This is especially important if you take diabetes medicines or blood thinners. ? Taking medicines such as aspirin and ibuprofen. These medicines can thin your blood. Do not take these medicines before your procedure if your doctor tells you not to.  Plan to have someone take you home from the hospital or clinic. What happens during the procedure?  You will lie on an exam table with your feet and legs supported as in a pelvic exam.  To lower your risk of infection: ? Your health care team will wash or sanitize their hands and put on germ-free (sterile) gloves. ? Your genital area will be washed with soap.  An IV tube will be inserted into one of your veins.  You will be given a medicine to help you relax (sedative).  A surgical instrument with a light and camera (resectoscope) will be inserted into your vagina and moved into your uterus. This allows your surgeon to see inside your uterus.  Endometrial tissue will be removed using one of the following methods: ? Radiofrequency. This method uses a radiofrequency-alternating electric current to remove the endometrium. ? Cryotherapy. This method uses extreme cold to freeze the endometrium. ? Heated-free liquid. This method uses a heated saltwater (saline) solution to remove the endometrium. ? Microwave. This method  uses high-energy microwaves to heat up the endometrium and remove it. ? Thermal balloon. This method involves inserting a catheter with a balloon tip into the uterus. The balloon tip is filled with heated fluid to remove the endometrium. The procedure may vary among health care providers and hospitals. What happens after the procedure?  Your blood pressure, heart rate, breathing rate, and blood oxygen level will be monitored until the medicines you were given have worn off.  As tissue healing occurs, you may notice vaginal bleeding for 4-6 weeks after the procedure. You may also experience: ? Cramps. ? Thin, watery vaginal discharge that is light pink or brown in color. ? A need to urinate more frequently  than usual. ? Nausea.  Do not drive for 24 hours if you were given a sedative.  Do not have sex or insert anything into your vagina until your health care provider approves. Summary  Endometrial ablation is done to treat the many causes of heavy menstrual bleeding.  The procedure may be done only after medications have been tried to control the bleeding.  Plan to have someone take you home from the hospital or clinic. This information is not intended to replace advice given to you by your health care provider. Make sure you discuss any questions you have with your health care provider. Document Released: 12/20/2003 Document Revised: 02/27/2016 Document Reviewed: 02/27/2016 Elsevier Interactive Patient Education  2017 [employer].

## 2016-08-05 NOTE — Progress Notes (Signed)
   Patient is a 46 year old with previous tubal ligation presented to the office for evaluation as a result of her menorrhagia. She was seen by Elon Alas our nurse practitioner for annual exam in March 2018. Patient been prescribed Lysteda to help with her menorrhagia but insurance would not cover it. She is here for sonohysterogram and endometrial biopsy. Patient had a normal Pap smear 2016.  Patient was counseled for sonohysterogram.  Ultrasound/sono hysterogram: Uterus measured 8.1 x 6.1 x 5.3 cm with endometrial stripe of 4 mm. Subserous and intramural fibroids were noted as follows 34 x 29 x 34 mm, 18 x 26 no meter, 11 x 17 mm, and 15 x 25 mm. Right ovary calcified wall. Left ovary normal no fluid in the cul-de-sac. The cervix was cleansed with Betadine solution a sterile catheter was introduced into the uterine cavity normal saline was instilled no intracavitary defect was noted. A sterile Pipelle was introduced into the uterine cavity and an endometrial biopsy was obtained and tissue submitted for histological evaluation. Patient tolerated procedure well.  Patient been complain some low back discomfort radiating down to her legs especially with some movements and on flexion and extension she has some lower back discomfort possibly underlying sciatica. She also has had multiple scar revisions from her Pfannenstiel incision Baldo Ash and she may have underlying scar tissue contributing to her discomfort as well.  Assessment/plan: Menorrhagia. Treatment options discussed to include IUD, continues oral contraceptive pill, Depo-Provera or endometrial ablation. She would like to proceed with endometrial ablation. I've given her literature information on the cryoablation technique called her option that we can perform here in the office. We'll wait for the results of pathology. She just finished her last menstrual cycle so she would be ready to start the procedure sometime in 3 weeks and will be  prescribed Prometrium 200 mg for 2 weeks before the procedure. I will see her the day before for preop exam and to provided with prescriptions.

## 2016-08-07 ENCOUNTER — Telehealth: Payer: Self-pay

## 2016-08-07 DIAGNOSIS — E559 Vitamin D deficiency, unspecified: Secondary | ICD-10-CM | POA: Diagnosis not present

## 2016-08-07 DIAGNOSIS — E782 Mixed hyperlipidemia: Secondary | ICD-10-CM | POA: Diagnosis not present

## 2016-08-07 DIAGNOSIS — Z0000 Encounter for general adult medical examination without abnormal findings: Secondary | ICD-10-CM | POA: Diagnosis not present

## 2016-08-07 NOTE — Telephone Encounter (Signed)
Patient called. I discussed ins benefits and her estimated surgery prepymt for Her Option ablation in the office.  She was not financially prepared to schedule surgery and said she wanted time to look a finances and would call back to scheduled when she is ready.

## 2016-08-07 NOTE — Telephone Encounter (Signed)
I called patient and left message I was calling to discuss endometrial ablation that she had talked with Dr. Moshe Salisbury about. I have checked her ins benefits and want to discuss that as well as scheduling.

## 2016-08-07 NOTE — Telephone Encounter (Signed)
Encounter already open

## 2016-08-07 NOTE — Telephone Encounter (Signed)
Alternative option is to put in a Mirena IUD if insurance covers it?

## 2016-08-11 NOTE — Telephone Encounter (Signed)
Detailed message left in voice mail per [facility] access note on file. I asked her to call me if she would like [place] to check ins benefits for Mirena IUD.

## 2016-08-11 NOTE — Telephone Encounter (Signed)
Patient called back and said she is interested in [place] and would like [place] to check benefits and let her know about costs. I will forward to Abigail Butts to check.

## 2016-11-16 DIAGNOSIS — J9801 Acute bronchospasm: Secondary | ICD-10-CM | POA: Diagnosis not present

## 2016-11-16 DIAGNOSIS — R509 Fever, unspecified: Secondary | ICD-10-CM | POA: Diagnosis not present

## 2016-12-01 DIAGNOSIS — R9389 Abnormal findings on diagnostic imaging of other specified body structures: Secondary | ICD-10-CM | POA: Diagnosis not present

## 2016-12-01 DIAGNOSIS — J302 Other seasonal allergic rhinitis: Secondary | ICD-10-CM | POA: Diagnosis not present

## 2016-12-04 DIAGNOSIS — Z23 Encounter for immunization: Secondary | ICD-10-CM | POA: Diagnosis not present

## 2016-12-07 DIAGNOSIS — H40013 Open angle with borderline findings, low risk, bilateral: Secondary | ICD-10-CM | POA: Diagnosis not present

## 2017-01-11 DIAGNOSIS — Z1231 Encounter for screening mammogram for malignant neoplasm of breast: Secondary | ICD-10-CM | POA: Diagnosis not present

## 2017-01-11 DIAGNOSIS — R928 Other abnormal and inconclusive findings on diagnostic imaging of breast: Secondary | ICD-10-CM | POA: Diagnosis not present

## 2017-02-08 DIAGNOSIS — R0689 Other abnormalities of breathing: Secondary | ICD-10-CM | POA: Diagnosis not present

## 2017-02-08 DIAGNOSIS — J0191 Acute recurrent sinusitis, unspecified: Secondary | ICD-10-CM | POA: Diagnosis not present

## 2017-02-11 DIAGNOSIS — J0191 Acute recurrent sinusitis, unspecified: Secondary | ICD-10-CM | POA: Diagnosis not present

## 2017-02-11 DIAGNOSIS — R0689 Other abnormalities of breathing: Secondary | ICD-10-CM | POA: Diagnosis not present

## 2017-02-17 ENCOUNTER — Other Ambulatory Visit: Payer: Self-pay | Admitting: Family Medicine

## 2017-02-17 ENCOUNTER — Other Ambulatory Visit (HOSPITAL_COMMUNITY): Payer: Self-pay | Admitting: Respiratory Therapy

## 2017-02-17 DIAGNOSIS — J0191 Acute recurrent sinusitis, unspecified: Secondary | ICD-10-CM

## 2017-02-17 DIAGNOSIS — R0689 Other abnormalities of breathing: Secondary | ICD-10-CM

## 2017-03-22 ENCOUNTER — Encounter (HOSPITAL_COMMUNITY): Payer: BLUE CROSS/BLUE SHIELD

## 2017-03-31 ENCOUNTER — Ambulatory Visit (HOSPITAL_COMMUNITY): Payer: BLUE CROSS/BLUE SHIELD | Attending: Family Medicine

## 2017-04-01 DIAGNOSIS — R05 Cough: Secondary | ICD-10-CM | POA: Diagnosis not present

## 2017-06-14 DIAGNOSIS — J209 Acute bronchitis, unspecified: Secondary | ICD-10-CM | POA: Diagnosis not present

## 2017-06-14 DIAGNOSIS — J309 Allergic rhinitis, unspecified: Secondary | ICD-10-CM | POA: Diagnosis not present

## 2017-06-14 DIAGNOSIS — J9801 Acute bronchospasm: Secondary | ICD-10-CM | POA: Diagnosis not present

## 2017-10-23 DIAGNOSIS — B349 Viral infection, unspecified: Secondary | ICD-10-CM | POA: Diagnosis not present

## 2017-11-04 DIAGNOSIS — J0100 Acute maxillary sinusitis, unspecified: Secondary | ICD-10-CM | POA: Diagnosis not present

## 2017-12-16 ENCOUNTER — Telehealth: Payer: Self-pay | Admitting: *Deleted

## 2017-12-16 NOTE — Telephone Encounter (Signed)
Patient called c/o missed cycle, LMP:10/27/17, history of tubal ligation per patient, I advised her to schedule visit with provider for problem visit and also schedule annual exam, transferred to appointment desk to schedule.

## 2017-12-17 DIAGNOSIS — Z23 Encounter for immunization: Secondary | ICD-10-CM | POA: Diagnosis not present

## 2018-01-03 ENCOUNTER — Ambulatory Visit (INDEPENDENT_AMBULATORY_CARE_PROVIDER_SITE_OTHER): Payer: BLUE CROSS/BLUE SHIELD | Admitting: Women's Health

## 2018-01-03 ENCOUNTER — Encounter: Payer: Self-pay | Admitting: Women's Health

## 2018-01-03 VITALS — BP 120/80

## 2018-01-03 DIAGNOSIS — N912 Amenorrhea, unspecified: Secondary | ICD-10-CM

## 2018-01-03 LAB — TSH: TSH: 0.45 mIU/L

## 2018-01-03 NOTE — Patient Instructions (Signed)
Menopause Menopause is the normal time of life when menstrual periods stop completely. Menopause is complete when you have missed 12 consecutive menstrual periods. It usually occurs between the ages of 48 years and 55 years. Very rarely does a woman develop menopause before the age of 40 years. At menopause, your ovaries stop producing the female hormones estrogen and progesterone. This can cause undesirable symptoms and also affect your health. Sometimes the symptoms may occur 4-5 years before the menopause begins. There is no relationship between menopause and:  Oral contraceptives.  Number of children you had.  Race.  The age your menstrual periods started (menarche).  Heavy smokers and very thin women may develop menopause earlier in life. What are the causes?  The ovaries stop producing the female hormones estrogen and progesterone. Other causes include:  Surgery to remove both ovaries.  The ovaries stop functioning for no known reason.  Tumors of the pituitary gland in the brain.  Medical disease that affects the ovaries and hormone production.  Radiation treatment to the abdomen or pelvis.  Chemotherapy that affects the ovaries.  What are the signs or symptoms?  Hot flashes.  Night sweats.  Decrease in sex drive.  Vaginal dryness and thinning of the vagina causing painful intercourse.  Dryness of the skin and developing wrinkles.  Headaches.  Tiredness.  Irritability.  Memory problems.  Weight gain.  Bladder infections.  Hair growth of the face and chest.  Infertility. More serious symptoms include:  Loss of bone (osteoporosis) causing breaks (fractures).  Depression.  Hardening and narrowing of the arteries (atherosclerosis) causing heart attacks and strokes.  How is this diagnosed?  When the menstrual periods have stopped for 12 straight months.  Physical exam.  Hormone studies of the blood. How is this treated? There are many treatment  choices and nearly as many questions about them. The decisions to treat or not to treat menopausal changes is an individual choice made with your health care provider. Your health care provider can discuss the treatments with you. Together, you can decide which treatment will work best for you. Your treatment choices may include:  Hormone therapy (estrogen and progesterone).  Non-hormonal medicines.  Treating the individual symptoms with medicine (for example antidepressants for depression).  Herbal medicines that may help specific symptoms.  Counseling by a psychiatrist or psychologist.  Group therapy.  Lifestyle changes including: ? Eating healthy. ? Regular exercise. ? Limiting caffeine and alcohol. ? Stress management and meditation.  No treatment.  Follow these instructions at home:  Take the medicine your health care provider gives you as directed.  Get plenty of sleep and rest.  Exercise regularly.  Eat a diet that contains calcium (good for the bones) and soy products (acts like estrogen hormone).  Avoid alcoholic beverages.  Do not smoke.  If you have hot flashes, dress in layers.  Take supplements, calcium, and vitamin D to strengthen bones.  You can use over-the-counter lubricants or moisturizers for vaginal dryness.  Group therapy is sometimes very helpful.  Acupuncture may be helpful in some cases. Contact a health care provider if:  You are not sure you are in menopause.  You are having menopausal symptoms and need advice and treatment.  You are still having menstrual periods after age 55 years.  You have pain with intercourse.  Menopause is complete (no menstrual period for 12 months) and you develop vaginal bleeding.  You need a referral to a specialist (gynecologist, psychiatrist, or psychologist) for treatment. Get help right   away if:  You have severe depression.  You have excessive vaginal bleeding.  You fell and think you have a  broken bone.  You have pain when you urinate.  You develop leg or chest pain.  You have a fast pounding heart beat (palpitations).  You have severe headaches.  You develop vision problems.  You feel a lump in your breast.  You have abdominal pain or severe indigestion. This information is not intended to replace advice given to you by your health care provider. Make sure you discuss any questions you have with your health care provider. Document Released: 05/02/2003 Document Revised: 07/18/2015 Document Reviewed: 09/08/2012 Elsevier Interactive Patient Education  2017 [employer]  

## 2018-01-03 NOTE — Progress Notes (Signed)
47 year old MWF G2 P2 presents with complaint of no cycle since September 5.  Reports normal regular 7-day heavy monthly cycles since starting cycles.  Reports increased hot flashes for the past 6 months.  BTL.  States had 4- home UPT's.  Denies urinary symptoms, vaginal discharge, abdominal/back pain or fever.  No known health problems. History of hyperthyroidism in the past, last TSH normal.  Exam: Appears well.   Perimenopause  Plan: FSH, TSH, reviewed if elevated no treatment, if low will withdrawal with Provera.  Menopause reviewed, tolerating symptoms well at this time.  Annual exam due instructed to schedule

## 2018-01-04 ENCOUNTER — Other Ambulatory Visit: Payer: Self-pay | Admitting: Women's Health

## 2018-01-04 LAB — FOLLICLE STIMULATING HORMONE: FSH: 3.5 m[IU]/mL

## 2018-01-04 MED ORDER — MEDROXYPROGESTERONE ACETATE 10 MG PO TABS: 10.0000 mg | ORAL_TABLET | Freq: Every day | ORAL | 0 refills | Status: DC

## 2018-01-18 DIAGNOSIS — R509 Fever, unspecified: Secondary | ICD-10-CM | POA: Diagnosis not present

## 2018-01-18 DIAGNOSIS — B349 Viral infection, unspecified: Secondary | ICD-10-CM | POA: Diagnosis not present

## 2018-01-19 ENCOUNTER — Ambulatory Visit: Payer: BLUE CROSS/BLUE SHIELD | Admitting: Women's Health

## 2018-02-07 ENCOUNTER — Ambulatory Visit: Payer: BLUE CROSS/BLUE SHIELD | Admitting: Women's Health

## 2018-02-09 DIAGNOSIS — L299 Pruritus, unspecified: Secondary | ICD-10-CM | POA: Diagnosis not present

## 2018-05-18 DIAGNOSIS — R05 Cough: Secondary | ICD-10-CM | POA: Diagnosis not present

## 2018-12-01 DIAGNOSIS — Z23 Encounter for immunization: Secondary | ICD-10-CM | POA: Diagnosis not present

## 2019-05-17 DIAGNOSIS — Z20828 Contact with and (suspected) exposure to other viral communicable diseases: Secondary | ICD-10-CM | POA: Diagnosis not present

## 2019-05-22 DIAGNOSIS — N6489 Other specified disorders of breast: Secondary | ICD-10-CM | POA: Diagnosis not present

## 2019-05-22 DIAGNOSIS — R922 Inconclusive mammogram: Secondary | ICD-10-CM | POA: Diagnosis not present

## 2019-05-22 DIAGNOSIS — R928 Other abnormal and inconclusive findings on diagnostic imaging of breast: Secondary | ICD-10-CM | POA: Diagnosis not present

## 2019-05-25 DIAGNOSIS — N61 Mastitis without abscess: Secondary | ICD-10-CM | POA: Diagnosis not present

## 2019-05-25 DIAGNOSIS — N6031 Fibrosclerosis of right breast: Secondary | ICD-10-CM | POA: Diagnosis not present

## 2019-05-25 DIAGNOSIS — R928 Other abnormal and inconclusive findings on diagnostic imaging of breast: Secondary | ICD-10-CM | POA: Diagnosis not present

## 2019-06-13 DIAGNOSIS — Z20828 Contact with and (suspected) exposure to other viral communicable diseases: Secondary | ICD-10-CM | POA: Diagnosis not present

## 2019-06-13 DIAGNOSIS — Z03818 Encounter for observation for suspected exposure to other biological agents ruled out: Secondary | ICD-10-CM | POA: Diagnosis not present

## 2019-06-14 DIAGNOSIS — J0100 Acute maxillary sinusitis, unspecified: Secondary | ICD-10-CM | POA: Diagnosis not present

## 2019-06-14 DIAGNOSIS — R062 Wheezing: Secondary | ICD-10-CM | POA: Diagnosis not present

## 2019-12-08 ENCOUNTER — Inpatient Hospital Stay (HOSPITAL_COMMUNITY)
Admission: EM | Admit: 2019-12-08 | Discharge: 2019-12-11 | DRG: 280 | Disposition: A | Discharge status: Home / Self Care | Payer: BC Managed Care – PPO | Attending: Internal Medicine | Admitting: Internal Medicine

## 2019-12-08 ENCOUNTER — Emergency Department (HOSPITAL_COMMUNITY): Payer: BC Managed Care – PPO

## 2019-12-08 ENCOUNTER — Encounter (HOSPITAL_COMMUNITY): Payer: Self-pay | Admitting: Emergency Medicine

## 2019-12-08 ENCOUNTER — Other Ambulatory Visit: Payer: Self-pay

## 2019-12-08 DIAGNOSIS — Z91030 Bee allergy status: Secondary | ICD-10-CM

## 2019-12-08 DIAGNOSIS — R739 Hyperglycemia, unspecified: Secondary | ICD-10-CM | POA: Diagnosis not present

## 2019-12-08 DIAGNOSIS — E876 Hypokalemia: Secondary | ICD-10-CM | POA: Diagnosis not present

## 2019-12-08 DIAGNOSIS — E8770 Fluid overload, unspecified: Secondary | ICD-10-CM | POA: Diagnosis not present

## 2019-12-08 DIAGNOSIS — I10 Essential (primary) hypertension: Secondary | ICD-10-CM | POA: Diagnosis not present

## 2019-12-08 DIAGNOSIS — I214 Non-ST elevation (NSTEMI) myocardial infarction: Secondary | ICD-10-CM | POA: Diagnosis not present

## 2019-12-08 DIAGNOSIS — Z91048 Other nonmedicinal substance allergy status: Secondary | ICD-10-CM

## 2019-12-08 DIAGNOSIS — R0902 Hypoxemia: Secondary | ICD-10-CM | POA: Diagnosis present

## 2019-12-08 DIAGNOSIS — R21 Rash and other nonspecific skin eruption: Secondary | ICD-10-CM | POA: Diagnosis not present

## 2019-12-08 DIAGNOSIS — R1110 Vomiting, unspecified: Secondary | ICD-10-CM | POA: Diagnosis not present

## 2019-12-08 DIAGNOSIS — I340 Nonrheumatic mitral (valve) insufficiency: Secondary | ICD-10-CM | POA: Diagnosis not present

## 2019-12-08 DIAGNOSIS — Z20822 Contact with and (suspected) exposure to covid-19: Secondary | ICD-10-CM | POA: Diagnosis present

## 2019-12-08 DIAGNOSIS — R404 Transient alteration of awareness: Secondary | ICD-10-CM | POA: Diagnosis not present

## 2019-12-08 DIAGNOSIS — J189 Pneumonia, unspecified organism: Secondary | ICD-10-CM | POA: Diagnosis not present

## 2019-12-08 DIAGNOSIS — E782 Mixed hyperlipidemia: Secondary | ICD-10-CM | POA: Diagnosis not present

## 2019-12-08 DIAGNOSIS — R0602 Shortness of breath: Secondary | ICD-10-CM | POA: Diagnosis not present

## 2019-12-08 DIAGNOSIS — I495 Sick sinus syndrome: Secondary | ICD-10-CM | POA: Diagnosis present

## 2019-12-08 DIAGNOSIS — K529 Noninfective gastroenteritis and colitis, unspecified: Secondary | ICD-10-CM | POA: Diagnosis not present

## 2019-12-08 DIAGNOSIS — E872 Acidosis: Secondary | ICD-10-CM | POA: Diagnosis not present

## 2019-12-08 DIAGNOSIS — Z79899 Other long term (current) drug therapy: Secondary | ICD-10-CM | POA: Diagnosis not present

## 2019-12-08 DIAGNOSIS — Z23 Encounter for immunization: Secondary | ICD-10-CM | POA: Diagnosis not present

## 2019-12-08 DIAGNOSIS — I480 Paroxysmal atrial fibrillation: Principal | ICD-10-CM | POA: Diagnosis present

## 2019-12-08 DIAGNOSIS — E1165 Type 2 diabetes mellitus with hyperglycemia: Secondary | ICD-10-CM | POA: Diagnosis present

## 2019-12-08 DIAGNOSIS — Z9851 Tubal ligation status: Secondary | ICD-10-CM | POA: Diagnosis not present

## 2019-12-08 DIAGNOSIS — J810 Acute pulmonary edema: Secondary | ICD-10-CM | POA: Diagnosis not present

## 2019-12-08 DIAGNOSIS — I4891 Unspecified atrial fibrillation: Secondary | ICD-10-CM | POA: Diagnosis not present

## 2019-12-08 DIAGNOSIS — R001 Bradycardia, unspecified: Secondary | ICD-10-CM | POA: Diagnosis not present

## 2019-12-08 DIAGNOSIS — E785 Hyperlipidemia, unspecified: Secondary | ICD-10-CM | POA: Diagnosis not present

## 2019-12-08 DIAGNOSIS — I361 Nonrheumatic tricuspid (valve) insufficiency: Secondary | ICD-10-CM | POA: Diagnosis not present

## 2019-12-08 DIAGNOSIS — J90 Pleural effusion, not elsewhere classified: Secondary | ICD-10-CM | POA: Diagnosis not present

## 2019-12-08 DIAGNOSIS — R778 Other specified abnormalities of plasma proteins: Secondary | ICD-10-CM | POA: Diagnosis present

## 2019-12-08 DIAGNOSIS — R112 Nausea with vomiting, unspecified: Secondary | ICD-10-CM

## 2019-12-08 DIAGNOSIS — E559 Vitamin D deficiency, unspecified: Secondary | ICD-10-CM | POA: Diagnosis not present

## 2019-12-08 HISTORY — DX: Unspecified atrial fibrillation: I48.91

## 2019-12-08 LAB — CBC
HCT: 38.8 % (ref 36.0–46.0)
Hemoglobin: 12.2 g/dL (ref 12.0–15.0)
MCH: 25.6 pg — ABNORMAL LOW (ref 26.0–34.0)
MCHC: 31.4 g/dL (ref 30.0–36.0)
MCV: 81.5 fL (ref 80.0–100.0)
Platelets: 298 10*3/uL (ref 150–400)
RBC: 4.76 MIL/uL (ref 3.87–5.11)
RDW: 13.6 % (ref 11.5–15.5)
WBC: 11 10*3/uL — ABNORMAL HIGH (ref 4.0–10.5)
nRBC: 0 % (ref 0.0–0.2)

## 2019-12-08 LAB — COMPREHENSIVE METABOLIC PANEL
ALT: 84 U/L — ABNORMAL HIGH (ref 0–44)
AST: 60 U/L — ABNORMAL HIGH (ref 15–41)
Albumin: 4 g/dL (ref 3.5–5.0)
Alkaline Phosphatase: 80 U/L (ref 38–126)
Anion gap: 12 (ref 5–15)
BUN: 13 mg/dL (ref 6–20)
CO2: 23 mmol/L (ref 22–32)
Calcium: 8.8 mg/dL — ABNORMAL LOW (ref 8.9–10.3)
Chloride: 102 mmol/L (ref 98–111)
Creatinine, Ser: 0.59 mg/dL (ref 0.44–1.00)
GFR, Estimated: >60 mL/min (ref >60)
Glucose, Bld: 232 mg/dL — ABNORMAL HIGH (ref 70–99)
Potassium: 2.7 mmol/L — CL (ref 3.5–5.1)
Sodium: 137 mmol/L (ref 135–145)
Total Bilirubin: 0.6 mg/dL (ref 0.3–1.2)
Total Protein: 7 g/dL (ref 6.5–8.1)

## 2019-12-08 LAB — I-STAT BETA HCG BLOOD, ED (MC, WL, AP ONLY): I-stat hCG, quantitative: <5 m[IU]/mL (ref <5)

## 2019-12-08 LAB — MAGNESIUM: Magnesium: 1.7 mg/dL (ref 1.7–2.4)

## 2019-12-08 LAB — TROPONIN I (HIGH SENSITIVITY): Troponin I (High Sensitivity): 4 ng/L (ref <18)

## 2019-12-08 LAB — LIPASE, BLOOD: Lipase: 31 U/L (ref 11–51)

## 2019-12-08 MED ORDER — DIPHENHYDRAMINE HCL 50 MG/ML IJ SOLN
25.0000 mg | Freq: Once | INTRAMUSCULAR | Status: AC
  Administered 2019-12-08: 25 mg via INTRAVENOUS
  Filled 2019-12-08: qty 1

## 2019-12-08 MED ORDER — SODIUM CHLORIDE 0.9 % IV BOLUS
1000.0000 mL | Freq: Once | INTRAVENOUS | Status: AC
  Administered 2019-12-08: 1000 mL via INTRAVENOUS

## 2019-12-08 MED ORDER — METOPROLOL TARTRATE 5 MG/5ML IV SOLN
2.5000 mg | Freq: Once | INTRAVENOUS | Status: AC
  Administered 2019-12-08: 2.5 mg via INTRAVENOUS
  Filled 2019-12-08: qty 5

## 2019-12-08 MED ORDER — POTASSIUM CHLORIDE 10 MEQ/100ML IV SOLN
10.0000 meq | INTRAVENOUS | Status: AC
  Administered 2019-12-08 – 2019-12-09 (×2): 10 meq via INTRAVENOUS
  Filled 2019-12-08 (×2): qty 100

## 2019-12-08 MED ORDER — ONDANSETRON HCL 4 MG/2ML IJ SOLN: 4.0000 mg | Freq: Once | INTRAMUSCULAR | Status: DC

## 2019-12-08 MED ORDER — POTASSIUM CHLORIDE CRYS ER 20 MEQ PO TBCR
40.0000 meq | EXTENDED_RELEASE_TABLET | Freq: Once | ORAL | Status: DC
  Filled 2019-12-08 (×2): qty 2

## 2019-12-08 MED ORDER — PROMETHAZINE HCL 25 MG/ML IJ SOLN: 25.0000 mg | Freq: Once | INTRAMUSCULAR | Status: DC

## 2019-12-08 MED ORDER — SODIUM CHLORIDE 0.9 % IV BOLUS
1000.0000 mL | Freq: Once | INTRAVENOUS | Status: AC
  Administered 2019-12-09: 1000 mL via INTRAVENOUS

## 2019-12-08 MED ORDER — SODIUM CHLORIDE 0.9% FLUSH
3.0000 mL | Freq: Once | INTRAVENOUS | Status: AC
  Administered 2019-12-09: 3 mL via INTRAVENOUS

## 2019-12-08 MED ORDER — METOCLOPRAMIDE HCL 5 MG/ML IJ SOLN
10.0000 mg | Freq: Once | INTRAMUSCULAR | Status: AC
  Administered 2019-12-08: 10 mg via INTRAVENOUS
  Filled 2019-12-08: qty 2

## 2019-12-08 MED ORDER — PANTOPRAZOLE SODIUM 40 MG IV SOLR
40.0000 mg | Freq: Once | INTRAVENOUS | Status: AC
  Administered 2019-12-09: 40 mg via INTRAVENOUS
  Filled 2019-12-08: qty 40

## 2019-12-08 NOTE — ED Notes (Signed)
Informed provider of critical K+ lab

## 2019-12-08 NOTE — ED Notes (Signed)
Pt had an episode of vomiting and heart decreased into the 40's and returned to 60's after vomiting. Pt becomes drowsy when heart rate decreases.

## 2019-12-08 NOTE — ED Notes (Signed)
Pt's family reports that she had a flu shot today.

## 2019-12-08 NOTE — ED Provider Notes (Signed)
[facility] EMERGENCY DEPARTMENT Provider Note   CSN: [id number] Arrival date & time: 12/08/19  2135     History Chief Complaint  Patient presents with  . Bradycardia  . Emesis    Dana Rojas is a 49 y.o. female with past medical history of vitamin D deficiency who presents the ED for abdominal pain, emesis and bradycardia.  Some history were obtained from patient's partner.  He states that she received her flu shot this afternoon at 4 PM and started to feel sick around 7 PM with emesis.  She endorses abdominal pain and diarrhea.  Her partner states that they ate some [place] which is heavily spiced.  They typically do not eat heavily spicy food.  She denies chest pain, shortness of breath, palpitation, skipped beats or extra beats.  EMS also noticed some pauses with bradycardia, heart rates in the 40s.  Patient denies any heart issue history.  HPI     Past Medical History:  Diagnosis Date  . Allergy   . Atrial fibrillation with tachycardic ventricular rate ([place]) 12/08/2019  . Seasonal allergies   . Vitamin D deficiency     Patient Active Problem List   Diagnosis Date Noted  . New onset a-fib ([place]) 12/08/2019  . Atrial fibrillation ([place]) 12/08/2019  . Bradycardia, sinus 12/08/2019  . Non-intractable vomiting 12/08/2019  . Sciatica of left side 08/05/2016  . Hyperthyroidism 12/20/2014  . Intramural leiomyoma of uterus 02/02/2014  . Menorrhagia with regular cycle 02/02/2014  . DYSLIPIDEMIA 08/12/2009  . OBESITY 08/12/2009  . GESTATIONAL DIABETES 08/12/2009  . SCOLIOSIS 08/12/2009  . HEADACHE 08/12/2009  . CHICKENPOX, HX OF 08/12/2009    Past Surgical History:  Procedure Laterality Date  . HERNIA REPAIR    . HERNIA REPAIR     X2  . TUBAL LIGATION       OB History    Gravida  2   Para  2   Term      Preterm      AB      Living  2     SAB      TAB      Ectopic      Multiple      Live Births               Family History  Problem Relation Age of Onset  . Heart disease Mother   . Heart disease Father   . Diabetes Paternal Grandfather   . Thyroid disease Neg Hx     Social History   Tobacco Use  . Smoking status: Never Smoker  . Smokeless tobacco: Never Used  Substance Use Topics  . Alcohol use: No  . Drug use: No    Home Medications Prior to Admission medications   Medication Sig Start Date End Date Taking? Authorizing Provider  albuterol (PROVENTIL HFA;VENTOLIN HFA) 108 (90 Base) MCG/ACT inhaler Inhale 2 puffs into the lungs every 4 (four) hours as needed for wheezing or shortness of breath (cough, shortness of breath or wheezing.). 04/29/16   Ivar Drape D, PA  DiphenhydrAMINE HCl (BENADRYL PO) Take by mouth.    [provider]  EPINEPHrine 0.3 mg/0.3 mL IJ SOAJ injection Reason minister if you have any respiratory distress following an insect sting and call 911 09/01/14   Darlyne Russian, MD  fluticasone (FLONASE) 50 MCG/ACT nasal spray Place 2 sprays into both nostrils daily. 04/29/16   Ivar Drape D, PA  ipratropium (ATROVENT) 0.03 % nasal  spray Place 2 sprays into the nose 2 (two) times daily. 08/17/11 7/74/12  [name], PA-C  loratadine (CLARITIN) 10 MG tablet Take 10 mg by mouth daily.    [provider]  medroxyPROGESTERone (PROVERA) 10 MG tablet Take 1 tablet (10 mg total) by mouth daily. 01/04/18   Huel Cote, NP    Allergies    Bee venom  Review of Systems   Review of Systems  Respiratory: Negative for chest tightness and shortness of breath.   Cardiovascular: Negative for chest pain and palpitations.  Gastrointestinal: Positive for abdominal pain, diarrhea, nausea and vomiting.    Physical Exam Updated Vital Signs BP 139/80   Pulse 71   Resp (!) 23   Ht 5\' 2"  (1.575 m)   Wt 81.2 kg   SpO2 97%   BMI 32.74 kg/m   Physical Exam Constitutional:      General: She is in acute distress.     Comments: Vomiting  HENT:      Head: Normocephalic.  Eyes:     General:        Right eye: No discharge.        Left eye: No discharge.  Cardiovascular:     Rate and Rhythm: Normal rate and regular rhythm.  Pulmonary:     Effort: No respiratory distress.     Breath sounds: Normal breath sounds.  Abdominal:     General: Bowel sounds are normal. There is distension.     Tenderness: There is no guarding.     Comments: Mild tenderness to palpation at right upper quadrant and epigastric  Musculoskeletal:        General: Normal range of motion.     Right lower leg: Edema (Trace) present.     Left lower leg: Edema (Trace) present.  Skin:    General: Skin is warm.     Coloration: Skin is not jaundiced.  Neurological:     Mental Status: She is alert.     ED Results / Procedures / Treatments   Labs (all labs ordered are listed, but only abnormal results are displayed) Labs Reviewed  COMPREHENSIVE METABOLIC PANEL - Abnormal; Notable for the following components:      Result Value   Potassium 2.7 (*)    Glucose, Bld 232 (*)    Calcium 8.8 (*)    AST 60 (*)    ALT 84 (*)    All other components within normal limits  CBC - Abnormal; Notable for the following components:   WBC 11.0 (*)    MCH 25.6 (*)    All other components within normal limits  RESPIRATORY PANEL BY RT PCR (FLU A&B, COVID)  LIPASE, BLOOD  MAGNESIUM  URINALYSIS, ROUTINE W REFLEX MICROSCOPIC  I-STAT BETA HCG BLOOD, ED ([facility], [facility], [facility] ONLY)  TROPONIN I (HIGH SENSITIVITY)    EKG EKG Interpretation  Date/Time:  Friday December 08 2019 23:08:58 EDT Ventricular Rate:  145 PR Interval:    QRS Duration: 77 QT Interval:  314 QTC Calculation: 488 R Axis:   77 Text Interpretation: Atrial fibrillation Nonspecific repol abnormality, diffuse leads Baseline wander in lead(s) V3 rate faster than previous, afib vs atrial with PAC Reconfirmed by Wandra Arthurs [phone]) on 12/08/2019 11:22:15 PM   Radiology DG Chest Port 1 View  Result Date:  12/08/2019 CLINICAL DATA:  49 year old female with vomiting. EXAM: PORTABLE CHEST 1 VIEW COMPARISON:  Chest radiograph dated 06/14/2019. FINDINGS: There is mild interstitial and peribronchial densities which may represent  mild congestion, reactive airway disease, or viral infection. Clinical correlation is recommended. No focal consolidation, pleural effusion, or pneumothorax. The cardiac silhouette is within limits. No acute osseous pathology. IMPRESSION: No focal consolidation. Interstitial and peribronchial densities as above. Electronically Signed   By: Anner Crete M.D.   On: 12/08/2019 22:54    Procedures Procedures (including critical care time)  Medications Ordered in ED Medications  sodium chloride flush (NS) 0.9 % injection 3 mL (has no administration in time range)  potassium chloride 10 mEq in 100 mL IVPB (10 mEq Intravenous New Bag/Given 12/08/19 2324)  potassium chloride SA (KLOR-CON) CR tablet 40 mEq (has no administration in time range)  pantoprazole (PROTONIX) injection 40 mg (has no administration in time range)  sodium chloride 0.9 % bolus 1,000 mL (has no administration in time range)  metoprolol tartrate (LOPRESSOR) injection 2.5 mg (has no administration in time range)  sodium chloride 0.9 % bolus 1,000 mL (0 mLs Intravenous Stopped 12/08/19 2318)  metoCLOPramide (REGLAN) injection 10 mg (10 mg Intravenous Given 12/08/19 2314)  diphenhydrAMINE (BENADRYL) injection 25 mg (25 mg Intravenous Given 12/08/19 2313)  sodium chloride 0.9 % bolus 1,000 mL (1,000 mLs Intravenous New Bag/Given 12/08/19 2322)    ED Course  I have reviewed the triage vital signs and the nursing notes.  Pertinent labs & imaging results that were available during my care of the patient were reviewed by me and considered in my medical decision making (see chart for details).  Patient seen and examined.  She is vomiting during examination.  Lipase came back 31.  CBC showed leukocytosis of 11.  CMP  showed potassium of 2.7, likely due to diarrhea and vomiting.  Repleted aggressively.  Also check magnesium.  Her GI symptom is likely due to viral gastroenteritis or stomach upset due to heavily spicy food.  BP 139/80   Pulse 71   Resp (!) 23   Ht 5\' 2"  (1.575 m)   Wt 81.2 kg   SpO2 97%   BMI 32.74 kg/m   Patient heart rate is elevated in the 160s.  EKG shows atrial fibrillation, may be due to cardiology was consulted.  Patient will be admitted to the hospital for further management of her hypokalemia and possible atrial fibrillation.    MDM Rules/Calculators/A&P                          Patient presents to the ED for abdominal pain and vomiting.  Lipase came back normal.  CBC shows leukocytosis 11.  CMP shows hypokalemia of 2.7 likely due to diarrhea vomiting.  This is likely viral gastroenteritis or stomach upset due to heavily spicy food take.  Patient developed atrial fibrillation with heart rate in the 160s, unsure if is related to her hypokalemia.  Magnesium came back normal.  Cardiology is consulted and recommended admission.  Final Clinical Impression(s) / ED Diagnoses Final diagnoses:  Non-intractable vomiting with nausea, unspecified vomiting type  Atrial fibrillation, unspecified type [facility])    Rx / DC Orders ED Discharge Orders    None       Gaylan Gerold, DO 12/08/19 2355    Drenda Freeze, MD 12/11/19 [phone]

## 2019-12-08 NOTE — Consult Note (Signed)
CONSULTATION NOTE   Patient Name: Dana Rojas Date of Encounter: 12/08/2019 Cardiologist: No primary care provider on file.  Chief Complaint   Nausea, vomiting, dizzinesss  Impression   1. Active Problems: 2.   New onset a-fib ([place]) 3.   Atrial fibrillation with tachycardic ventricular rate ([place]) 4.   Bradycardia, sinus 5. Chadsvasc is 1 (HTN). Tachy-brady syndrome  Recommendation   - admit to medicine. Work up for GI issues (nausea, vomiting)?Viral gastro enteritis - s/p IVF and replace lytes, keep K>4.0, Mg>2.0 - give IV metoprolol 2.5mg  x1 and then 2.5mg  x1, if still in afib RVR then start diltiazem gtt  - patient very symptomatic, keep her NPO,  If continues to do poorly then will need to do DCCV (onset was abrupt while here in the ER). If we do DCCV then she will need Eliquis 5mg  BID for 1 month  - once her nausea improves- she should be on metoprolol tart 25mg  BID - obtain ECHO in am, check TSH  Patient Profile   49 y/o female comes in with n, v, abdominal pain-noted to be brady and then afib RVR.  HPI   Dana Rojas is a 49 y.o. female who is being seen today for the evaluation of afib (Tachy brady at the request of Dr Shirlyn Goltz  Patient and husband bedside reports that she was doing fine until this evening when she firs started having nausea, vomiting which brought her to the ER. While in the ER- she was in NSR and had a few pauses, bradycardia/sinus but then went into Afib HR 170s with worsening symptoms of dizzy, nausea, feeling poorly. 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  Poorly she ate [place] food that her friend made earlier in the day.  She is normally does not eat spicy foods according to her husband.  She reports that after she got her flu shot her arm hurt more than it normally does when she gets a flu shot.  She has not had any fever.  She went to a football game with her children and while there she started to  feel bad which she described as a weakness and just feeling warm and then she became nauseous and started vomiting  ER course: 2lts IVF, metoprolol 2.5mg  x1, kdur 11meq.   EKG: sinus brady Second ekg- Afib, R-R interval is irregular, but I see p waves ECG    - Personally Reviewed  Telemetry    - Personally Reviewed. Initially was NSR and then had sinus brady with pause and brady down to 40s. Then speed back up- classic for slow and then fast Afib  Radiology   DG Chest [facility] 1 View  Result Date: 12/08/2019 CLINICAL DATA:  49 year old female with vomiting. EXAM: PORTABLE CHEST 1 VIEW COMPARISON:  Chest radiograph dated 06/14/2019. FINDINGS: There is mild interstitial and peribronchial densities which may represent mild congestion, reactive airway disease, or viral infection. Clinical correlation is recommended. No focal consolidation, pleural effusion, or pneumothorax. The cardiac silhouette is within limits. No acute osseous pathology. IMPRESSION: No focal consolidation. Interstitial and peribronchial densities as above. Electronically Signed   By: Anner Crete M.D.   On: 12/08/2019 22:54    Cardiac Studies   As above  PMHx   Past Medical History:  Diagnosis Date  . Allergy   . Atrial fibrillation with tachycardic ventricular rate ([place]) 12/08/2019  . Seasonal allergies   . Vitamin D deficiency  Past Surgical History:  Procedure Laterality Date  . HERNIA REPAIR    . HERNIA REPAIR     X2  . TUBAL LIGATION      FAMHx   Family History  Problem Relation Age of Onset  . Heart disease Mother   . Heart disease Father   . Diabetes Paternal Grandfather   . Thyroid disease Neg Hx     SOCHx    reports that she has never smoked. She has never used smokeless tobacco. She reports that she does not drink alcohol and does not use drugs.  Outpatient Medications   No current facility-administered medications on file prior to encounter.   Current Outpatient Medications  on File Prior to Encounter  Medication Sig Dispense Refill  . albuterol (PROVENTIL HFA;VENTOLIN HFA) 108 (90 Base) MCG/ACT inhaler Inhale 2 puffs into the lungs every 4 (four) hours as needed for wheezing or shortness of breath (cough, shortness of breath or wheezing.). 1 Inhaler 1  . DiphenhydrAMINE HCl (BENADRYL PO) Take by mouth.    . EPINEPHrine 0.3 mg/0.3 mL IJ SOAJ injection Reason minister if you have any respiratory distress following an insect sting and call 911 1 Device 2  . fluticasone (FLONASE) 50 MCG/ACT nasal spray Place 2 sprays into both nostrils daily. 16 g 12  . ipratropium (ATROVENT) 0.03 % nasal spray Place 2 sprays into the nose 2 (two) times daily. 30 mL 0  . loratadine (CLARITIN) 10 MG tablet Take 10 mg by mouth daily.    . medroxyPROGESTERone (PROVERA) 10 MG tablet Take 1 tablet (10 mg total) by mouth daily. 5 tablet 0    Inpatient Medications    Scheduled Meds: . metoprolol tartrate  2.5 mg Intravenous Once  . pantoprazole (PROTONIX) IV  40 mg Intravenous Once  . potassium chloride SA  40 mEq Oral Once  . sodium chloride flush  3 mL Intravenous Once    Continuous Infusions: . potassium chloride 10 mEq (12/08/19 2324)  . sodium chloride      PRN Meds:    ALLERGIES   Allergies  Allergen Reactions  . Bee Venom     Body swelling    ROS  Negative except above  Vitals   Vitals:   12/08/19 2141 12/08/19 2143 12/08/19 2159 12/08/19 2300  BP: (!) 159/86  (!) 147/77 139/80  Pulse: (!) 42   71  Resp: 16  16 (!) 23  TempSrc:   Oral   SpO2: 96%  96% 97%  Weight:  81.2 kg    Height:  5\' 2"  (1.575 m)     No intake or output data in the 24 hours ending 12/08/19 2332 Filed Weights   12/08/19 2143  Weight: 81.2 kg    Physical Exam   HEENT: Normocephalic and atraumatic, shivering Neck: Supple. No carotid bruits. No JVD. Heart: tachycardic, normal S1 and S2.no murmurs No gallops or rubs. Radial and distal pedal pulses 2+ and equal bilaterally. Lungs:  clear to auscultation b/l, no crackles, wheeze Abdomen: Soft, non-distended, and non-tender to palpation. Bowel sounds present. Extremities: No lower extremity edema.    Skin: Warm and dry. Neuro: Alert,oriented x3, No focal deficits. Psych: Normal affect. Responds appropriately.  Labs   Results for orders placed or performed during the hospital encounter of 12/08/19 (from the past 48 hour(s))  Lipase, blood     Status: None   Collection Time: 12/08/19  9:48 PM  Result Value Ref Range   Lipase 31 11 - 51 U/L  Comment: Performed at [facility], [place] [address] [place], [place] [address]  Comprehensive metabolic panel     Status: Abnormal   Collection Time: 12/08/19  9:48 PM  Result Value Ref Range   Sodium 137 135 - 145 mmol/L   Potassium 2.7 (LL) 3.5 - 5.1 mmol/L    Comment: CRITICAL RESULT CALLED TO, READ BACK BY AND VERIFIED WITH: J.GLOSTER,RN @2245  12/08/2019 [email or web address]    Chloride 102 98 - 111 mmol/L   CO2 23 22 - 32 mmol/L   Glucose, Bld 232 (H) 70 - 99 mg/dL    Comment: Glucose reference range applies only to samples taken after fasting for at least 8 hours.   BUN 13 6 - 20 mg/dL   Creatinine, Ser 0.59 0.44 - 1.00 mg/dL   Calcium 8.8 (L) 8.9 - 10.3 mg/dL   Total Protein 7.0 6.5 - 8.1 g/dL   Albumin 4.0 3.5 - 5.0 g/dL   AST 60 (H) 15 - 41 U/L   ALT 84 (H) 0 - 44 U/L   Alkaline Phosphatase 80 38 - 126 U/L   Total Bilirubin 0.6 0.3 - 1.2 mg/dL   GFR, Estimated >60 >60 mL/min   Anion gap 12 5 - 15    Comment: Performed at [facility], [place] [address], [place], [place] [address]  CBC     Status: Abnormal   Collection Time: 12/08/19  9:48 PM  Result Value Ref Range   WBC 11.0 (H) 4.0 - 10.5 K/uL   RBC 4.76 3.87 - 5.11 MIL/uL   Hemoglobin 12.2 12.0 - 15.0 g/dL   HCT 38.8 36 - 46 %   MCV 81.5 80.0 - 100.0 fL   MCH 25.6 (L) 26.0 - 34.0 pg   MCHC 31.4 30.0 - 36.0 g/dL   RDW 13.6 11.5 - 15.5 %   Platelets 298 150 - 400 K/uL   nRBC 0.0 0.0 - 0.2 %    Comment:  Performed at [facility], [place] [address] [place], [place] [address]  I-Stat beta hCG blood, ED     Status: None   Collection Time: 12/08/19 10:02 PM  Result Value Ref Range   I-stat hCG, quantitative <5.0 <5 mIU/mL   Comment 3            Comment:   GEST. AGE      CONC.  (mIU/mL)   <=1 WEEK        5 - 50     2 WEEKS       50 - 500     3 WEEKS       100 - 10,000     4 WEEKS     1,000 - 30,000        FEMALE AND NON-PREGNANT FEMALE:     LESS THAN 5 mIU/mL         Length of Stay:  LOS: 0 days      Renae Fickle 12/08/2019, 11:32 PM

## 2019-12-09 ENCOUNTER — Inpatient Hospital Stay (HOSPITAL_COMMUNITY): Payer: BC Managed Care – PPO

## 2019-12-09 ENCOUNTER — Encounter (HOSPITAL_COMMUNITY): Payer: Self-pay | Admitting: Family Medicine

## 2019-12-09 DIAGNOSIS — K529 Noninfective gastroenteritis and colitis, unspecified: Secondary | ICD-10-CM

## 2019-12-09 DIAGNOSIS — I340 Nonrheumatic mitral (valve) insufficiency: Secondary | ICD-10-CM

## 2019-12-09 DIAGNOSIS — J810 Acute pulmonary edema: Secondary | ICD-10-CM | POA: Diagnosis not present

## 2019-12-09 DIAGNOSIS — Z20822 Contact with and (suspected) exposure to covid-19: Secondary | ICD-10-CM | POA: Diagnosis not present

## 2019-12-09 DIAGNOSIS — I4891 Unspecified atrial fibrillation: Secondary | ICD-10-CM

## 2019-12-09 DIAGNOSIS — I214 Non-ST elevation (NSTEMI) myocardial infarction: Secondary | ICD-10-CM

## 2019-12-09 DIAGNOSIS — E1165 Type 2 diabetes mellitus with hyperglycemia: Secondary | ICD-10-CM | POA: Diagnosis not present

## 2019-12-09 DIAGNOSIS — I10 Essential (primary) hypertension: Secondary | ICD-10-CM | POA: Diagnosis present

## 2019-12-09 DIAGNOSIS — I480 Paroxysmal atrial fibrillation: Secondary | ICD-10-CM | POA: Diagnosis not present

## 2019-12-09 DIAGNOSIS — R7989 Other specified abnormal findings of blood chemistry: Secondary | ICD-10-CM | POA: Diagnosis present

## 2019-12-09 DIAGNOSIS — R778 Other specified abnormalities of plasma proteins: Secondary | ICD-10-CM | POA: Diagnosis present

## 2019-12-09 DIAGNOSIS — R112 Nausea with vomiting, unspecified: Secondary | ICD-10-CM

## 2019-12-09 DIAGNOSIS — Z9851 Tubal ligation status: Secondary | ICD-10-CM | POA: Diagnosis not present

## 2019-12-09 DIAGNOSIS — I361 Nonrheumatic tricuspid (valve) insufficiency: Secondary | ICD-10-CM

## 2019-12-09 DIAGNOSIS — Z79899 Other long term (current) drug therapy: Secondary | ICD-10-CM | POA: Diagnosis not present

## 2019-12-09 DIAGNOSIS — R0902 Hypoxemia: Secondary | ICD-10-CM | POA: Diagnosis not present

## 2019-12-09 DIAGNOSIS — E8770 Fluid overload, unspecified: Secondary | ICD-10-CM | POA: Diagnosis present

## 2019-12-09 DIAGNOSIS — I495 Sick sinus syndrome: Secondary | ICD-10-CM | POA: Diagnosis not present

## 2019-12-09 DIAGNOSIS — E782 Mixed hyperlipidemia: Secondary | ICD-10-CM | POA: Diagnosis not present

## 2019-12-09 DIAGNOSIS — R739 Hyperglycemia, unspecified: Secondary | ICD-10-CM

## 2019-12-09 DIAGNOSIS — Z91030 Bee allergy status: Secondary | ICD-10-CM | POA: Diagnosis not present

## 2019-12-09 DIAGNOSIS — Z91048 Other nonmedicinal substance allergy status: Secondary | ICD-10-CM | POA: Diagnosis not present

## 2019-12-09 DIAGNOSIS — E876 Hypokalemia: Secondary | ICD-10-CM

## 2019-12-09 DIAGNOSIS — E559 Vitamin D deficiency, unspecified: Secondary | ICD-10-CM | POA: Diagnosis not present

## 2019-12-09 DIAGNOSIS — E872 Acidosis: Secondary | ICD-10-CM | POA: Diagnosis not present

## 2019-12-09 DIAGNOSIS — E785 Hyperlipidemia, unspecified: Secondary | ICD-10-CM | POA: Diagnosis not present

## 2019-12-09 LAB — I-STAT ARTERIAL BLOOD GAS, ED
Acid-base deficit: 4 mmol/L — ABNORMAL HIGH (ref 0.0–2.0)
Acid-base deficit: 1 mmol/L (ref 0.0–2.0)
Bicarbonate: 21.5 mmol/L (ref 20.0–28.0)
Bicarbonate: 24.2 mmol/L (ref 20.0–28.0)
Calcium, Ion: 1.07 mmol/L — ABNORMAL LOW (ref 1.15–1.40)
Calcium, Ion: 1.07 mmol/L — ABNORMAL LOW (ref 1.15–1.40)
HCT: 36 % (ref 36.0–46.0)
HCT: 39 % (ref 36.0–46.0)
Hemoglobin: 12.2 g/dL (ref 12.0–15.0)
Hemoglobin: 13.3 g/dL (ref 12.0–15.0)
O2 Saturation: 88 %
O2 Saturation: 98 %
Patient temperature: 99
Patient temperature: 97
Potassium: 3.7 mmol/L (ref 3.5–5.1)
Potassium: 3.7 mmol/L (ref 3.5–5.1)
Sodium: 140 mmol/L (ref 135–145)
Sodium: 141 mmol/L (ref 135–145)
TCO2: 23 mmol/L (ref 22–32)
TCO2: 25 mmol/L (ref 22–32)
pCO2 arterial: 40.8 mmHg (ref 32.0–48.0)
pCO2 arterial: 40.4 mmHg (ref 32.0–48.0)
pH, Arterial: 7.331 — ABNORMAL LOW (ref 7.350–7.450)
pH, Arterial: 7.382 (ref 7.350–7.450)
pO2, Arterial: 60 mmHg — ABNORMAL LOW (ref 83.0–108.0)
pO2, Arterial: 106 mmHg (ref 83.0–108.0)

## 2019-12-09 LAB — ECHOCARDIOGRAM COMPLETE
AR max vel: 2.3 cm2
AV Area VTI: 2.51 cm2
AV Area mean vel: 2.51 cm2
AV Mean grad: 3 mmHg
AV Peak grad: 6.5 mmHg
Ao pk vel: 1.27 m/s
Area-P 1/2: 4.8 cm2
Height: 62 in
S' Lateral: 2.2 cm
Weight: 2864 oz

## 2019-12-09 LAB — TROPONIN I (HIGH SENSITIVITY)
Troponin I (High Sensitivity): 2285 ng/L (ref <18)
Troponin I (High Sensitivity): 2676 ng/L (ref <18)

## 2019-12-09 LAB — URINALYSIS, ROUTINE W REFLEX MICROSCOPIC
Bacteria, UA: NONE SEEN
Bilirubin Urine: NEGATIVE
Glucose, UA: >=500 mg/dL — AB
Hgb urine dipstick: NEGATIVE
Ketones, ur: 5 mg/dL — AB
Leukocytes,Ua: NEGATIVE
Nitrite: NEGATIVE
Protein, ur: NEGATIVE mg/dL
Specific Gravity, Urine: 1.005 (ref 1.005–1.030)
pH: 6 (ref 5.0–8.0)

## 2019-12-09 LAB — BASIC METABOLIC PANEL
Anion gap: 12 (ref 5–15)
BUN: 8 mg/dL (ref 6–20)
CO2: 24 mmol/L (ref 22–32)
Calcium: 8.2 mg/dL — ABNORMAL LOW (ref 8.9–10.3)
Chloride: 105 mmol/L (ref 98–111)
Creatinine, Ser: 0.57 mg/dL (ref 0.44–1.00)
GFR, Estimated: >60 mL/min (ref >60)
Glucose, Bld: 153 mg/dL — ABNORMAL HIGH (ref 70–99)
Potassium: 3.7 mmol/L (ref 3.5–5.1)
Sodium: 141 mmol/L (ref 135–145)

## 2019-12-09 LAB — HEPARIN LEVEL (UNFRACTIONATED): Heparin Unfractionated: 0.59 IU/mL (ref 0.30–0.70)

## 2019-12-09 LAB — CBC
HCT: 39.2 % (ref 36.0–46.0)
Hemoglobin: 12.2 g/dL (ref 12.0–15.0)
MCH: 25.8 pg — ABNORMAL LOW (ref 26.0–34.0)
MCHC: 31.1 g/dL (ref 30.0–36.0)
MCV: 82.9 fL (ref 80.0–100.0)
Platelets: 287 10*3/uL (ref 150–400)
RBC: 4.73 MIL/uL (ref 3.87–5.11)
RDW: 14 % (ref 11.5–15.5)
WBC: 10.9 10*3/uL — ABNORMAL HIGH (ref 4.0–10.5)
nRBC: 0 % (ref 0.0–0.2)

## 2019-12-09 LAB — LIPID PANEL
Cholesterol: 285 mg/dL — ABNORMAL HIGH (ref 0–200)
HDL: 34 mg/dL — ABNORMAL LOW (ref >40)
LDL Cholesterol: 207 mg/dL — ABNORMAL HIGH (ref 0–99)
Total CHOL/HDL Ratio: 8.4 RATIO
Triglycerides: 219 mg/dL — ABNORMAL HIGH (ref <150)
VLDL: 44 mg/dL — ABNORMAL HIGH (ref 0–40)

## 2019-12-09 LAB — HIV ANTIBODY (ROUTINE TESTING W REFLEX): HIV Screen 4th Generation wRfx: NONREACTIVE

## 2019-12-09 LAB — RESPIRATORY PANEL BY RT PCR (FLU A&B, COVID)
Influenza A by PCR: NEGATIVE
Influenza B by PCR: NEGATIVE
SARS Coronavirus 2 by RT PCR: NEGATIVE

## 2019-12-09 LAB — D-DIMER, QUANTITATIVE: D-Dimer, Quant: 0.45 ug/mL-FEU (ref 0.00–0.50)

## 2019-12-09 MED ORDER — ONDANSETRON HCL 4 MG/2ML IJ SOLN
4.0000 mg | Freq: Four times a day (QID) | INTRAMUSCULAR | Status: DC | PRN
  Administered 2019-12-09: 4 mg via INTRAVENOUS
  Filled 2019-12-09 (×2): qty 2

## 2019-12-09 MED ORDER — MAGNESIUM SULFATE 50 % IJ SOLN: 1.0000 g | Freq: Once | INTRAMUSCULAR | Status: DC

## 2019-12-09 MED ORDER — ACETAMINOPHEN 325 MG PO TABS
650.0000 mg | ORAL_TABLET | ORAL | Status: DC | PRN
  Administered 2019-12-10 (×2): 650 mg via ORAL
  Filled 2019-12-09 (×2): qty 2

## 2019-12-09 MED ORDER — ENOXAPARIN SODIUM 40 MG/0.4ML ~~LOC~~ SOLN
40.0000 mg | SUBCUTANEOUS | Status: DC
  Administered 2019-12-09: 40 mg via SUBCUTANEOUS
  Filled 2019-12-09: qty 0.4

## 2019-12-09 MED ORDER — PROMETHAZINE HCL 25 MG/ML IJ SOLN
12.5000 mg | Freq: Four times a day (QID) | INTRAMUSCULAR | Status: DC | PRN
  Administered 2019-12-09: 12.5 mg via INTRAVENOUS
  Filled 2019-12-09: qty 1

## 2019-12-09 MED ORDER — POTASSIUM CHLORIDE 10 MEQ/100ML IV SOLN
10.0000 meq | INTRAVENOUS | Status: AC
  Administered 2019-12-09 (×3): 10 meq via INTRAVENOUS
  Filled 2019-12-09 (×3): qty 100

## 2019-12-09 MED ORDER — DILTIAZEM HCL-DEXTROSE 125-5 MG/125ML-% IV SOLN (PREMIX)
5.0000 mg/h | INTRAVENOUS | Status: DC
  Administered 2019-12-09: 5 mg/h via INTRAVENOUS
  Filled 2019-12-09 (×2): qty 125

## 2019-12-09 MED ORDER — ASPIRIN EC 81 MG PO TBEC
81.0000 mg | DELAYED_RELEASE_TABLET | Freq: Every day | ORAL | Status: DC
  Administered 2019-12-09 – 2019-12-11 (×3): 81 mg via ORAL
  Filled 2019-12-09 (×3): qty 1

## 2019-12-09 MED ORDER — METOPROLOL TARTRATE 12.5 MG HALF TABLET
12.5000 mg | ORAL_TABLET | Freq: Two times a day (BID) | ORAL | Status: DC
  Administered 2019-12-09 – 2019-12-11 (×5): 12.5 mg via ORAL
  Filled 2019-12-09 (×5): qty 1

## 2019-12-09 MED ORDER — FUROSEMIDE 10 MG/ML IJ SOLN
40.0000 mg | Freq: Once | INTRAMUSCULAR | Status: AC
  Administered 2019-12-09: 40 mg via INTRAVENOUS
  Filled 2019-12-09: qty 4

## 2019-12-09 MED ORDER — HEPARIN (PORCINE) 25000 UT/250ML-% IV SOLN
1100.0000 [IU]/h | INTRAVENOUS | Status: DC
  Administered 2019-12-09: 850 [IU]/h via INTRAVENOUS
  Administered 2019-12-11: 1100 [IU]/h via INTRAVENOUS
  Filled 2019-12-09 (×3): qty 250

## 2019-12-09 MED ORDER — HEPARIN BOLUS VIA INFUSION
4000.0000 [IU] | Freq: Once | INTRAVENOUS | Status: AC
  Administered 2019-12-09: 4000 [IU] via INTRAVENOUS
  Filled 2019-12-09: qty 4000

## 2019-12-09 MED ORDER — LEVALBUTEROL HCL 1.25 MG/0.5ML IN NEBU
1.2500 mg | INHALATION_SOLUTION | Freq: Once | RESPIRATORY_TRACT | Status: AC
  Administered 2019-12-09: 1.25 mg via RESPIRATORY_TRACT
  Filled 2019-12-09: qty 0.5

## 2019-12-09 MED ORDER — PERFLUTREN LIPID MICROSPHERE
1.0000 mL | INTRAVENOUS | Status: AC | PRN
  Administered 2019-12-09: 3 mL via INTRAVENOUS
  Filled 2019-12-09: qty 10

## 2019-12-09 MED ORDER — LACTATED RINGERS IV SOLN: INTRAVENOUS | Status: DC

## 2019-12-09 MED ORDER — ATORVASTATIN CALCIUM 80 MG PO TABS
80.0000 mg | ORAL_TABLET | Freq: Every day | ORAL | Status: DC
  Administered 2019-12-09 – 2019-12-11 (×3): 80 mg via ORAL
  Filled 2019-12-09 (×3): qty 1

## 2019-12-09 MED ORDER — MAGNESIUM SULFATE IN D5W 1-5 GM/100ML-% IV SOLN
1.0000 g | Freq: Once | INTRAVENOUS | Status: AC
  Administered 2019-12-09: 1 g via INTRAVENOUS
  Filled 2019-12-09: qty 100

## 2019-12-09 NOTE — Plan of Care (Signed)
  Problem: Education: Goal: Knowledge of disease or condition will improve Outcome: Progressing Goal: Understanding of medication regimen will improve Outcome: Progressing Goal: Individualized Educational Video(s) Outcome: Progressing   

## 2019-12-09 NOTE — ED Notes (Signed)
Pt resting in bed. Husband at bedside. NADN. AFVSS. Off gtt per MD direction.

## 2019-12-09 NOTE — Progress Notes (Signed)
[facility] for heparin Indication: chest pain/ACS  Allergies  Allergen Reactions  . Bee Venom     Body swelling    Patient Measurements: Height: 5\' 2"  (157.5 cm) Weight: 83.7 kg (184 lb 8.4 oz) IBW/kg (Calculated) : 50.1 Heparin Dosing Weight: 68.2kg  Vital Signs: Temp: 98.3 F (36.8 C) (10/16 1515) Temp Source: Oral (10/16 1515) BP: 144/86 (10/16 1515) Pulse Rate: 82 (10/16 1515)  Labs: Recent Labs    12/08/19 2148 12/08/19 2148 12/09/19 0144 12/09/19 0144 12/09/19 0410 12/09/19 0825 12/09/19 1255 12/09/19 1737  HGB 12.2   < > 12.2   < > 13.3 12.2  --   --   HCT 38.8   < > 36.0  --  39.0 39.2  --   --   PLT 298  --   --   --   --  287  --   --   HEPARINUNFRC  --   --   --   --   --   --   --  0.59  CREATININE 0.59  --   --   --   --  0.57  --   --   TROPONINIHS 4  --   --   --   --  2,285* 2,676*  --    < > = values in this interval not displayed.    Estimated Creatinine Clearance: 85.3 mL/min (by C-G formula based on SCr of 0.57 mg/dL).   Medical History: Past Medical History:  Diagnosis Date  . Allergy   . Atrial fibrillation with tachycardic ventricular rate ([place]) 12/08/2019  . Seasonal allergies   . Vitamin D deficiency    Assessment: 49 YOF presenting with tachycardia and dyspnea, in Afib w/RVR.  Troponin also elevated and concern for NSTEMI.  Not on anticoagulation PTA, CBC wnl.  Initial heparin level therapeutic on 850 units/hr  Goal of Therapy:  Heparin level 0.3-0.7 units/ml Monitor platelets by anticoagulation protocol: Yes   Plan:  Continue heparin gtt at 850 units/hr F/u 6h heparin level Daily heparin level, CBC, s/s bleeding F/u echo and cards plan  Bertis Ruddy, PharmD Clinical Pharmacist ED Pharmacist Phone # [phone] 12/09/2019 6:41 PM

## 2019-12-09 NOTE — Progress Notes (Signed)
The patient is a 49 yr old woman who presented to [facility] ED on 12/08/2019 with complaints of nausea, vomiting, and abdominal pain. Her illness began at 4pm when she had received her flu shot. By 7 pm she had nausea and vomiting with generalized weakness. There was also abdominal cramping and diarrhea. She thought that it may have had something to do with [place] food that she ate around noon. EMS was called and they found the patient to be bradycardic in the 40's.   In the ED the patient was given IV fluids. She then developed tachycardia with HR in th 140-160 range. EKG demonstrated atrial fibrillation. He was also found to be hypokalemic at 2.7.   The patient has a past medical history significant for vitamin D Deficiency.  [facility] hospitalists were consulted to admit the patient for further evaluation and treatment.   The patient is resting comfortably. She is awake, alert, and oriented x 3. No acute distress. Heart and lung exams were within normal limits. Abdomen is soft, non-tender, non-distended. Extremities are  Without cyanosis, clubbing or edema.   Cardiology has been consulted and they have ordered echocardiogram.

## 2019-12-09 NOTE — ED Notes (Signed)
Pt called out and requested that RN remove bipap for the time being.  Pt is breathing much better on her own at this time.  She is much more alert and articulate.  Vitals stable at this time

## 2019-12-09 NOTE — ED Notes (Signed)
Pt complained of SOB. Audible expiratory wheezing present. Dr. Rudi Rummage notified and at bedside to assess patient. New orders received.

## 2019-12-09 NOTE — ED Notes (Signed)
Pt placed on bipap by RT

## 2019-12-09 NOTE — Progress Notes (Signed)
ANTICOAGULATION CONSULT NOTE - Initial Consult  Pharmacy Consult for heparin Indication: chest pain/ACS  Allergies  Allergen Reactions  . Bee Venom     Body swelling    Patient Measurements: Height: 5\' 2"  (157.5 cm) Weight: 81.2 kg (179 lb) IBW/kg (Calculated) : 50.1 Heparin Dosing Weight: 68.2kg  Vital Signs: Temp: 97 F (36.1 C) (10/16 0009) Temp Source: Rectal (10/16 0009) BP: 126/68 (10/16 0831) Pulse Rate: 78 (10/16 0831)  Labs: Recent Labs    12/08/19 2148 12/08/19 2148 12/09/19 0144 12/09/19 0144 12/09/19 0410 12/09/19 0825  HGB 12.2   < > 12.2   < > 13.3 12.2  HCT 38.8   < > 36.0  --  39.0 39.2  PLT 298  --   --   --   --  287  CREATININE 0.59  --   --   --   --  0.57  TROPONINIHS 4  --   --   --   --  2,285*   < > = values in this interval not displayed.    Estimated Creatinine Clearance: 83.9 mL/min (by C-G formula based on SCr of 0.57 mg/dL).   Medical History: Past Medical History:  Diagnosis Date  . Allergy   . Atrial fibrillation with tachycardic ventricular rate ([place]) 12/08/2019  . Seasonal allergies   . Vitamin D deficiency    Assessment: 39 YOF presenting with tachycardia and dyspnea, in Afib w/RVR.  Troponin also elevated and concern for NSTEMI.  Not on anticoagulation PTA, CBC wnl.  Goal of Therapy:  Heparin level 0.3-0.7 units/ml Monitor platelets by anticoagulation protocol: Yes   Plan:  Heparin 4000 units IV x 1, and gtt at 850 units/hr F/u 6 hour heparin level Daily heparin level, CBC , s/s bleeding F/u long term [facility] plan  Bertis Ruddy, PharmD Clinical Pharmacist ED Pharmacist Phone # [phone] 12/09/2019 11:22 AM

## 2019-12-09 NOTE — Progress Notes (Signed)
Pt placed on the BIPAP. She is tolerating it well at this time. RT will continue to monitor.

## 2019-12-09 NOTE — Progress Notes (Signed)
  Echocardiogram 2D Echocardiogram has been performed.  Dana Rojas 12/09/2019, 1:54 PM

## 2019-12-09 NOTE — ED Notes (Signed)
Improvement noted after medication and initiation of bipap. HR decreased to 90's and O2 maintaining around 95-97%.

## 2019-12-09 NOTE — H&P (Signed)
History and Physical    Dana Rojas HUD:[id number] DOB: Jul 05, 1970 DOA: 12/08/2019  PCP: Hulan Fess, MD   Patient coming from: Home  Chief Complaint: Nausea, vomiting, not feeling well  HPI: Dana Rojas is a 49 y.o. female with medical history significant for medical history of vitamin D deficiency who presents the ED for abdominal pain, nausea and vomiting and not feeling well. Husband is at bedside and helps provide history.  He states that she received her flu shot this afternoon at 4 PM and started to feel sick around 7 PM with nausea and vomiting and generalized weakness.  She reports mild abdominal cramping and diarrhea.  Poorly she ate [place] food that her friend made earlier in the day.  She is normally does not eat spicy foods according to her husband.  She reports that after she got her flu shot her arm hurt more than it normally does when she gets a flu shot.  She has not had any fever.  She went to a football game with her children and while there she started to feel bad which she described as a weakness and just feeling warm and then she became nauseous and started vomiting. She denies chest pain, palpitation, skipped beats or extra beats when she was at the football game.  911 was called. EMS also noticed some pauses with bradycardia, heart rates in the 40s.  Patient denies any heart issue history.  It is noted that her mother had a heart condition and needed a pacemaker in her mid 35s.  Her father also has heart disease and required a pacemaker.  ED Course: In the emergency room patient was noted initially to be bradycardic she was given some IV fluids and then she developed tachycardia with heart rate in the 140-160 range and EKG was repeated and showed atrial fibrillation.  Potassium was noted to be 2.7 and she was given 1 dose of oral potassium supplementation and IV potassium supplementation.  Cardiology was consulted and has seen patient in the emergency room.   Patient case was discussed with cardiology at patient bedside.  Review of Systems:  General: Reports fatigue and generalized weakness.denies fever, chills, weight loss, night sweats.  Denies dizziness.  Denies change in appetite HENT: Denies head trauma, headache, denies change in hearing, tinnitus.  Denies nasal congestion or bleeding.  Denies sore throat, sores in mouth.  Denies difficulty swallowing Eyes: Denies blurry vision, pain in eye, drainage.  Denies discoloration of eyes. Neck: Denies pain.  Denies swelling.  Denies pain with movement. Cardiovascular: Reports palpitations that began in the emergency room.  Denies chest pain.  Denies edema.  Denies orthopnea Respiratory: Reports shortness of breath with exertion earlier.  Denies cough.  Denies wheezing.  Denies sputum production Gastrointestinal: Reports nausea and vomiting, abdominal pain, diarrhea.  Denies melena.  Denies hematemesis. Musculoskeletal: Denies limitation of movement.  Denies deformity or swelling.  Denies pain.  Denies arthralgias or myalgias. Genitourinary: Denies pelvic pain.  Denies urinary frequency or hesitancy.  Denies dysuria.  Skin: Denies rash.  Denies petechiae, purpura, ecchymosis. Neurological: Denies headache.  Denies syncope.  Denies seizure activity.  Denies weakness or paresthesia.  Denies slurred speech, drooping face.  Denies visual change. Psychiatric: Denies depression, anxiety.  Denies suicidal thoughts or ideation.  Denies hallucinations.  Past Medical History:  Diagnosis Date  . Allergy   . Atrial fibrillation with tachycardic ventricular rate ([place]) 12/08/2019  . Seasonal allergies   . Vitamin D deficiency  Past Surgical History:  Procedure Laterality Date  . HERNIA REPAIR    . HERNIA REPAIR     X2  . TUBAL LIGATION      Social History  reports that she has never smoked. She has never used smokeless tobacco. She reports that she does not drink alcohol and does not use  drugs.  Allergies  Allergen Reactions  . Bee Venom     Body swelling    Family History  Problem Relation Age of Onset  . Heart disease Mother   . Heart disease Father   . Diabetes Paternal Grandfather   . Thyroid disease Neg Hx      Prior to Admission medications   Medication Sig Start Date End Date Taking? Authorizing Provider  albuterol (PROVENTIL HFA;VENTOLIN HFA) 108 (90 Base) MCG/ACT inhaler Inhale 2 puffs into the lungs every 4 (four) hours as needed for wheezing or shortness of breath (cough, shortness of breath or wheezing.). 04/29/16   Ivar Drape D, PA  DiphenhydrAMINE HCl (BENADRYL PO) Take by mouth.    [provider]  EPINEPHrine 0.3 mg/0.3 mL IJ SOAJ injection Reason minister if you have any respiratory distress following an insect sting and call 911 09/01/14   Darlyne Russian, MD  fluticasone (FLONASE) 50 MCG/ACT nasal spray Place 2 sprays into both nostrils daily. 04/29/16   Ivar Drape D, PA  ipratropium (ATROVENT) 0.03 % nasal spray Place 2 sprays into the nose 2 (two) times daily. 08/17/11 6/64/40  [name], PA-C  loratadine (CLARITIN) 10 MG tablet Take 10 mg by mouth daily.    [provider]  medroxyPROGESTERone (PROVERA) 10 MG tablet Take 1 tablet (10 mg total) by mouth daily. 01/04/18   Huel Cote, NP    Physical Exam: Vitals:   12/08/19 2330 12/08/19 2345 12/09/19 0000 12/09/19 0006  BP: 126/89 112/81 117/70   Pulse:  (!) 166 (!) 159 (!) 134  Resp:   (!) 22   TempSrc:      SpO2:   (!) 82%   Weight:      Height:        Constitutional: NAD, calm, comfortable Vitals:   12/08/19 2330 12/08/19 2345 12/09/19 0000 12/09/19 0006  BP: 126/89 112/81 117/70   Pulse:  (!) 166 (!) 159 (!) 134  Resp:   (!) 22   TempSrc:      SpO2:   (!) 82%   Weight:      Height:       General: WDWN, Alert and oriented x3.  Eyes: EOMI, PERRL, lids and conjunctivae normal.  Sclera nonicteric HENT:  [place]/AT, external ears normal.  Nares  patent without epistasis.  Mucous membranes are dry. Posterior pharynx clear of any exudate or lesions. Normal dentition.  Neck: Soft, normal range of motion, supple, no masses, no thyromegaly.  Trachea midline Respiratory: clear to auscultation bilaterally, no wheezing, no crackles. Normal respiratory effort. No accessory muscle use.  Cardiovascular: Irregularly irregular rhythm with tachycardia., no murmurs / rubs / gallops. No extremity edema. 2+ pedal pulses. Abdomen: Soft, no tenderness, nondistended, no rebound or guarding.  No masses palpated. Bowel sounds normoactive Musculoskeletal: FROM. no clubbing / cyanosis. No joint deformity upper and lower extremities. Normal muscle tone.  Skin: Warm, dry, intact no rashes, lesions, ulcers. No induration.  Mildly decreased skin turgor Neurologic: CN 2-12 grossly intact.  Normal speech.  Sensation intact, patella DTR +1 bilaterally. Strength 5/5 in all extremities.   Psychiatric: Normal judgment and insight.  Normal mood.    Labs on Admission: I have personally reviewed following labs and imaging studies  CBC: Recent Labs  Lab 12/08/19 2148  WBC 11.0*  HGB 12.2  HCT 38.8  MCV 81.5  PLT 025    Basic Metabolic Panel: Recent Labs  Lab 12/08/19 2148  NA 137  K 2.7*  CL 102  CO2 23  GLUCOSE 232*  BUN 13  CREATININE 0.59  CALCIUM 8.8*  MG 1.7    GFR: Estimated Creatinine Clearance: 83.9 mL/min (by C-G formula based on SCr of 0.59 mg/dL).  Liver Function Tests: Recent Labs  Lab 12/08/19 2148  AST 60*  ALT 84*  ALKPHOS 80  BILITOT 0.6  PROT 7.0  ALBUMIN 4.0    Urine analysis:    Component Value Date/Time   COLORURINE YELLOW 06/14/2015 1941   APPEARANCEUR CLEAR 06/14/2015 1941   LABSPEC 1.013 06/14/2015 1941   PHURINE 6.5 06/14/2015 1941   GLUCOSEU NEGATIVE 06/14/2015 1941   GLUCOSEU NEGATIVE 08/08/2009 0737   HGBUR NEGATIVE 06/14/2015 1941   BILIRUBINUR NEGATIVE 06/14/2015 1941   KETONESUR NEGATIVE 06/14/2015  1941   PROTEINUR NEGATIVE 06/14/2015 1941   UROBILINOGEN 0.2 08/08/2009 0737   NITRITE NEGATIVE 06/14/2015 1941   LEUKOCYTESUR NEGATIVE 06/14/2015 1941    Radiological Exams on Admission: DG Chest Port 1 View  Result Date: 12/08/2019 CLINICAL DATA:  49 year old female with vomiting. EXAM: PORTABLE CHEST 1 VIEW COMPARISON:  Chest radiograph dated 06/14/2019. FINDINGS: There is mild interstitial and peribronchial densities which may represent mild congestion, reactive airway disease, or viral infection. Clinical correlation is recommended. No focal consolidation, pleural effusion, or pneumothorax. The cardiac silhouette is within limits. No acute osseous pathology. IMPRESSION: No focal consolidation. Interstitial and peribronchial densities as above. Electronically Signed   By: Anner Crete M.D.   On: 12/08/2019 22:54    EKG: Independently reviewed.  EKG is reviewed and shows atrial fibrillation with RVR with heart rate in the 150s.  No acute ST elevation or depression.  QTc is 488.  First EKG showed normal sinus rhythm but then patient developed tachycardia and repeat EKG was obtained  Assessment/Plan Principal Problem:   New onset a-fib [facility]) Ms. Siems is admitted to cardiac telemetry unit.  She has been seen by cardiology in the emergency room.  Atrial fibrillation started tonight while in the emergency room.  He has no history of atrial fibrillation Been provided with metoprolol and if continues to have A. fib with RVR will then start Cardizem per cardiology's recommendation. Electrolytes are being monitored with goal magnesium greater than 2 and potassium greater than 4 per cardiology recommendation. Monitor serial troponin levels. Echocardiogram in the morning to evaluate wall motion and EF We will get n.p.o. in case she will need cardioversion by cardiology in the morning if A. fib persists  Active Problems:   Hypokalemia  Initial potassium was 2.7.  Patient is given  potassium supplementation and will recheck electrolytes and renal function in the morning    Non-intractable vomiting Phenergan provided as needed for nausea and vomiting.  Secondary to probable gastroenteritis   Gastroenteritis Clinically patient with gastroenteritis with acute onset of symptoms of nausea and vomiting.    Hyperglycemia Blood sugar elevated when she arrived in the emergency room.  We will recheck blood sugar in the morning.  Patient has no history of diabetes     DVT prophylaxis: Lovenox for DVT prophylaxis. Code Status:   Full code Family Communication:   Discussed with patient and her husband who  is at bedside.  Questions were answered.  Further recommendations to follow as clinical indicated Disposition Plan:   Patient is from:  Home  Anticipated DC to:  Home  Anticipated DC date:  Anticipate greater than 2 midnight stay in the hospital to treat acute medical condition  Anticipated DC barriers: No barriers to discharge identified at this time  Consults called:  Cardiology Admission status:  Inpatient   Yevonne Aline Geroge Gilliam MD [facility]  How to contact the [facility] Attending or Consulting provider [place] or covering provider during after hours [place], for this patient?   1. Check the care team in [facility] and look for a) attending/consulting [facility] provider listed and b) the [facility] team listed 2. Log into [email or web address] and use [place]'s universal password to access. If you do not have the password, please contact the hospital operator. 3. Locate the [facility] provider you are looking for under [facility] and page to a number that you can be directly reached. 4. If you still have difficulty reaching the provider, please page the [facility] (Director on Call) for the Hospitalists listed on amion for assistance.  12/09/2019, 12:21 AM

## 2019-12-09 NOTE — ED Notes (Signed)
Pt placed on non nonrebreather at 15lpm via [place].

## 2019-12-09 NOTE — Progress Notes (Signed)
DAILY PROGRESS NOTE   Patient Name: Dana Rojas Date of Encounter: 12/09/2019 Cardiologist: No primary care provider on file.  Chief Complaint   No complaints  Patient Profile   49 yo female who presented with dyspnea and tachycardia was found to be in A. fib with RVR.  Felt to be volume overloaded and required BiPAP and Lasix.  Subsequently she converted back to sinus rhythm.  Subjective   Dana Rojas is feeling better today after diuresis, being able to come off of BiPAP and she is maintaining sinus rhythm in the 70s.  She was written to start on metoprolol 12.5 mg twice daily.  Her CHA2DS2-VASc score is 2 (female, hypertension) -and we may need to consider long-term anticoagulation although her duration of atrial fibrillation was less than 24 hours. Her second troponin has resulted significantly abnormal this morning at 2285 - initial was 4. Suggestive of NSTEMI - perhaps N/V was related to acute MI, complicated by ischemic Afib? Will expedite echo today - trend troponin and start IV heparin - no chest pain.  Objective   Vitals:   12/09/19 0600 12/09/19 0738 12/09/19 0757 12/09/19 0831  BP: 116/79 118/73  126/68  Pulse: 68 85  78  Resp: (!) 22 18  18   Temp:      TempSrc:      SpO2: 98% 96% 96% 98%  Weight:      Height:        Intake/Output Summary (Last 24 hours) at 12/09/2019 1059 Last data filed at 12/09/2019 0815 Gross per 24 hour  Intake 1151.56 ml  Output 800 ml  Net 351.56 ml   Filed Weights   12/08/19 2143  Weight: 81.2 kg    Physical Exam   General appearance: alert and no distress Neck: no carotid bruit, no JVD and thyroid not enlarged, symmetric, no tenderness/mass/nodules Lungs: clear to auscultation bilaterally Heart: regular rate and rhythm Abdomen: soft, non-tender; bowel sounds normal; no masses,  no organomegaly Extremities: extremities normal, atraumatic, no cyanosis or edema Pulses: 2+ and symmetric Skin: Skin color, texture,  turgor normal. No rashes or lesions Neurologic: Grossly normal Psych: Pleasant  Inpatient Medications    Scheduled Meds: . aspirin EC  81 mg Oral Daily  . atorvastatin  80 mg Oral QHS  . enoxaparin (LOVENOX) injection  40 mg Subcutaneous Q24H  . metoprolol tartrate  12.5 mg Oral BID  . potassium chloride SA  40 mEq Oral Once    Continuous Infusions: . diltiazem (CARDIZEM) infusion Stopped (12/09/19 0815)  . lactated ringers    . potassium chloride 10 mEq (12/09/19 0919)    PRN Meds: acetaminophen, ondansetron (ZOFRAN) IV, promethazine   Labs   Results for orders placed or performed during the hospital encounter of 12/08/19 (from the past 48 hour(s))  Lipase, blood     Status: None   Collection Time: 12/08/19  9:48 PM  Result Value Ref Range   Lipase 31 11 - 51 U/L    Comment: Performed at [facility], [place] [address], [place], [place] [address]  Comprehensive metabolic panel     Status: Abnormal   Collection Time: 12/08/19  9:48 PM  Result Value Ref Range   Sodium 137 135 - 145 mmol/L   Potassium 2.7 (LL) 3.5 - 5.1 mmol/L    Comment: CRITICAL RESULT CALLED TO, READ BACK BY AND VERIFIED WITH: J.GLOSTER,RN @2245  12/08/2019 [email or web address].J    Chloride 102 98 - 111 mmol/L   CO2 23 22 - 32 mmol/L  Glucose, Bld 232 (H) 70 - 99 mg/dL    Comment: Glucose reference range applies only to samples taken after fasting for at least 8 hours.   BUN 13 6 - 20 mg/dL   Creatinine, Ser 0.59 0.44 - 1.00 mg/dL   Calcium 8.8 (L) 8.9 - 10.3 mg/dL   Total Protein 7.0 6.5 - 8.1 g/dL   Albumin 4.0 3.5 - 5.0 g/dL   AST 60 (H) 15 - 41 U/L   ALT 84 (H) 0 - 44 U/L   Alkaline Phosphatase 80 38 - 126 U/L   Total Bilirubin 0.6 0.3 - 1.2 mg/dL   GFR, Estimated >60 >60 mL/min   Anion gap 12 5 - 15    Comment: Performed at [facility], [place] [address] [place], [place] [address]  CBC     Status: Abnormal   Collection Time: 12/08/19  9:48 PM  Result Value Ref Range   WBC 11.0 (H) 4.0 - 10.5  K/uL   RBC 4.76 3.87 - 5.11 MIL/uL   Hemoglobin 12.2 12.0 - 15.0 g/dL   HCT 38.8 36 - 46 %   MCV 81.5 80.0 - 100.0 fL   MCH 25.6 (L) 26.0 - 34.0 pg   MCHC 31.4 30.0 - 36.0 g/dL   RDW 13.6 11.5 - 15.5 %   Platelets 298 150 - 400 K/uL   nRBC 0.0 0.0 - 0.2 %    Comment: Performed at [facility], [place] [address], [place], [place] [address]  Troponin I (High Sensitivity)     Status: None   Collection Time: 12/08/19  9:48 PM  Result Value Ref Range   Troponin I (High Sensitivity) 4 <18 ng/L    Comment: (NOTE) Elevated high sensitivity troponin I (hsTnI) values and significant  changes across serial measurements may suggest ACS but many other  chronic and acute conditions are known to elevate hsTnI results.  Refer to the "Links" section for chest pain algorithms and additional  guidance. Performed at [facility], [place] [address], [place], [place] [address]   Magnesium     Status: None   Collection Time: 12/08/19  9:48 PM  Result Value Ref Range   Magnesium 1.7 1.7 - 2.4 mg/dL    Comment: Performed at [facility], [place] [address] [place], [place] [address]  I-Stat beta hCG blood, ED     Status: None   Collection Time: 12/08/19 10:02 PM  Result Value Ref Range   I-stat hCG, quantitative <5.0 <5 mIU/mL   Comment 3            Comment:   GEST. AGE      CONC.  (mIU/mL)   <=1 WEEK        5 - 50     2 WEEKS       50 - 500     3 WEEKS       100 - 10,000     4 WEEKS     1,000 - 30,000        FEMALE AND NON-PREGNANT FEMALE:     LESS THAN 5 mIU/mL   Respiratory Panel by RT PCR (Flu A&B, Covid) - Nasopharyngeal Swab     Status: None   Collection Time: 12/08/19 11:36 PM   Specimen: Nasopharyngeal Swab  Result Value Ref Range   SARS Coronavirus 2 by RT PCR NEGATIVE NEGATIVE    Comment: (NOTE) SARS-CoV-2 target nucleic acids are NOT DETECTED.  The SARS-CoV-2 RNA  is generally detectable in upper respiratoy specimens during the acute phase of infection. The  lowest concentration of SARS-CoV-2 viral copies this assay can detect is 131 copies/mL. A negative result does not preclude SARS-Cov-2 infection and should not be used as the sole basis for treatment or other patient management decisions. A negative result may occur with  improper specimen collection/handling, submission of specimen other than nasopharyngeal swab, presence of viral mutation(s) within the areas targeted by this assay, and inadequate number of viral copies (<131 copies/mL). A negative result must be combined with clinical observations, patient history, and epidemiological information. The expected result is Negative.  Fact Sheet for Patients:  PinkCheek.be  Fact Sheet for Healthcare Providers:  GravelBags.it  This test is no t yet approved or cleared by the [place] FDA and  has been authorized for detection and/or diagnosis of SARS-CoV-2 by FDA under an Emergency Use Authorization (EUA). This EUA will remain  in effect (meaning this test can be used) for the duration of the COVID-19 declaration under Section 564(b)(1) of the Act, 21 U.S.C. section 360bbb-3(b)(1), unless the authorization is terminated or revoked sooner.     Influenza A by PCR NEGATIVE NEGATIVE   Influenza B by PCR NEGATIVE NEGATIVE    Comment: (NOTE) The Xpert Xpress SARS-CoV-2/FLU/RSV assay is intended as an aid in  the diagnosis of influenza from Nasopharyngeal swab specimens and  should not be used as a sole basis for treatment. Nasal washings and  aspirates are unacceptable for Xpert Xpress SARS-CoV-2/FLU/RSV  testing.  Fact Sheet for Patients: PinkCheek.be  Fact Sheet for Healthcare Providers: GravelBags.it  This test is not yet approved or cleared by the [place] FDA and  has been authorized for detection and/or diagnosis of SARS-CoV-2 by  FDA under an Emergency  Use Authorization (EUA). This EUA will remain  in effect (meaning this test can be used) for the duration of the  Covid-19 declaration under Section 564(b)(1) of the Act, 21  U.S.C. section 360bbb-3(b)(1), unless the authorization is  terminated or revoked. Performed at [facility], [place] [address], [place], [place] [address]   D-dimer, quantitative (not at [facility])     Status: None   Collection Time: 12/09/19 12:52 AM  Result Value Ref Range   D-Dimer, Quant 0.45 0.00 - 0.50 ug/mL-FEU    Comment: (NOTE) At the manufacturer cut-off value of 0.5 g/mL FEU, this assay has a negative predictive value of 95-100%.This assay is intended for use in conjunction with a clinical pretest probability (PTP) assessment model to exclude pulmonary embolism (PE) and deep venous thrombosis (DVT) in outpatients suspected of PE or DVT. Results should be correlated with clinical presentation. Performed at [facility], [place] [address] [place], [place] [address]   I-Stat arterial blood gas, ED     Status: Abnormal   Collection Time: 12/09/19  1:44 AM  Result Value Ref Range   pH, Arterial 7.331 (L) 7.35 - 7.45   pCO2 arterial 40.8 32 - 48 mmHg   pO2, Arterial 60 (L) 83 - 108 mmHg   Bicarbonate 21.5 20.0 - 28.0 mmol/L   TCO2 23 22 - 32 mmol/L   O2 Saturation 88.0 %   Acid-base deficit 4.0 (H) 0.0 - 2.0 mmol/L   Sodium 140 135 - 145 mmol/L   Potassium 3.7 3.5 - 5.1 mmol/L   Calcium, Ion 1.07 (L) 1.15 - 1.40 mmol/L   HCT 36.0 36 - 46 %   Hemoglobin 12.2 12.0 - 15.0 g/dL  Patient temperature 99.0 F    Collection site Radial    Drawn by RT    Sample type ARTERIAL   Urinalysis, Routine w reflex microscopic     Status: Abnormal   Collection Time: 12/09/19  3:12 AM  Result Value Ref Range   Color, Urine COLORLESS (A) YELLOW   APPearance CLEAR CLEAR   Specific Gravity, Urine 1.005 1.005 - 1.030   pH 6.0 5.0 - 8.0   Glucose, UA >=500 (A) NEGATIVE mg/dL   Hgb urine dipstick NEGATIVE  NEGATIVE   Bilirubin Urine NEGATIVE NEGATIVE   Ketones, ur 5 (A) NEGATIVE mg/dL   Protein, ur NEGATIVE NEGATIVE mg/dL   Nitrite NEGATIVE NEGATIVE   Leukocytes,Ua NEGATIVE NEGATIVE   RBC / HPF 0-5 0 - 5 RBC/hpf   WBC, UA 0-5 0 - 5 WBC/hpf   Bacteria, UA NONE SEEN NONE SEEN    Comment: Performed at [facility], [address], [place], [place] [address]  I-Stat arterial blood gas, ED     Status: Abnormal   Collection Time: 12/09/19  4:10 AM  Result Value Ref Range   pH, Arterial 7.382 7.35 - 7.45   pCO2 arterial 40.4 32 - 48 mmHg   pO2, Arterial 106 83 - 108 mmHg   Bicarbonate 24.2 20.0 - 28.0 mmol/L   TCO2 25 22 - 32 mmol/L   O2 Saturation 98.0 %   Acid-base deficit 1.0 0.0 - 2.0 mmol/L   Sodium 141 135 - 145 mmol/L   Potassium 3.7 3.5 - 5.1 mmol/L   Calcium, Ion 1.07 (L) 1.15 - 1.40 mmol/L   HCT 39.0 36 - 46 %   Hemoglobin 13.3 12.0 - 15.0 g/dL   Patient temperature 97.0 F    Collection site Radial    Drawn by RT    Sample type ARTERIAL   HIV Antibody (routine testing w rflx)     Status: None   Collection Time: 12/09/19  8:25 AM  Result Value Ref Range   HIV Screen 4th Generation wRfx Non Reactive Non Reactive    Comment: Performed at [facility], [place] [address] [place], [place] [address]  Basic metabolic panel     Status: Abnormal   Collection Time: 12/09/19  8:25 AM  Result Value Ref Range   Sodium 141 135 - 145 mmol/L   Potassium 3.7 3.5 - 5.1 mmol/L   Chloride 105 98 - 111 mmol/L   CO2 24 22 - 32 mmol/L   Glucose, Bld 153 (H) 70 - 99 mg/dL    Comment: Glucose reference range applies only to samples taken after fasting for at least 8 hours.   BUN 8 6 - 20 mg/dL   Creatinine, Ser 0.57 0.44 - 1.00 mg/dL   Calcium 8.2 (L) 8.9 - 10.3 mg/dL   GFR, Estimated >60 >60 mL/min   Anion gap 12 5 - 15    Comment: Performed at [place] [address], [place], [place] [address]  CBC     Status: Abnormal   Collection Time: 12/09/19  8:25 AM  Result  Value Ref Range   WBC 10.9 (H) 4.0 - 10.5 K/uL   RBC 4.73 3.87 - 5.11 MIL/uL   Hemoglobin 12.2 12.0 - 15.0 g/dL   HCT 39.2 36 - 46 %   MCV 82.9 80.0 - 100.0 fL   MCH 25.8 (L) 26.0 - 34.0 pg   MCHC 31.1 30.0 - 36.0 g/dL   RDW 14.0 11.5 - 15.5 %  Platelets 287 150 - 400 K/uL   nRBC 0.0 0.0 - 0.2 %    Comment: Performed at [facility], [place] [address] [place], [place] [address]  Troponin I (High Sensitivity)     Status: Abnormal   Collection Time: 12/09/19  8:25 AM  Result Value Ref Range   Troponin I (High Sensitivity) 2,285 (HH) <18 ng/L    Comment: CRITICAL RESULT CALLED TO, READ BACK BY AND VERIFIED WITH: L.SHULAR RN @ [phone] 10.16.2021 BY C.EDENS (NOTE) Elevated high sensitivity troponin I (hsTnI) values and significant  changes across serial measurements may suggest ACS but many other  chronic and acute conditions are known to elevate hsTnI results.  Refer to the Links section for chest pain algorithms and additional  guidance. Performed at [facility], [place] [address] [place], [place] [address]   Lipid panel     Status: Abnormal   Collection Time: 12/09/19  8:25 AM  Result Value Ref Range   Cholesterol 285 (H) 0 - 200 mg/dL   Triglycerides 219 (H) <150 mg/dL   HDL 34 (L) >40 mg/dL   Total CHOL/HDL Ratio 8.4 RATIO   VLDL 44 (H) 0 - 40 mg/dL   LDL Cholesterol 207 (H) 0 - 99 mg/dL    Comment:        Total Cholesterol/HDL:CHD Risk Coronary Heart Disease Risk Table                     Men   Women  1/2 Average Risk   3.4   3.3  Average Risk       5.0   4.4  2 X Average Risk   9.6   7.1  3 X Average Risk  23.4   11.0        Use the calculated Patient Ratio above and the CHD Risk Table to determine the patient's CHD Risk.        ATP III CLASSIFICATION (LDL):  <100     mg/dL   Optimal  100-129  mg/dL   Near or Above                    Optimal  130-159  mg/dL   Borderline  160-189  mg/dL   High  >190     mg/dL   Very High Performed at [place] [address] [place], [place] [address]     ECG   A. fib with RVR at 145 (10/15)- Personally Reviewed  Telemetry   Sinus rhythm in the 70s- Personally Reviewed  Radiology    DG Chest Port 1 View  Result Date: 12/09/2019 CLINICAL DATA:  49 year old female with shortness of breath. EXAM: PORTABLE CHEST 1 VIEW COMPARISON:  Chest CT dated 08/17/2009 and radiograph dated 12/08/2019. FINDINGS: Interval progression of interstitial densities compared to the earlier radiograph likely representing worsening edema. Pneumonia is not excluded clinical correlation is recommended. Small bilateral pleural effusions suspected. No pneumothorax. Stable cardiac silhouette. No acute osseous pathology. IMPRESSION: Findings likely represent worsening edema. Pneumonia is not excluded. Clinical correlation is recommended. Electronically Signed   By: Anner Crete M.D.   On: 12/09/2019 02:18   DG Chest Port 1 View  Result Date: 12/08/2019 CLINICAL DATA:  49 year old female with vomiting. EXAM: PORTABLE CHEST 1 VIEW COMPARISON:  Chest radiograph dated 06/14/2019. FINDINGS: There is mild interstitial and peribronchial densities which may represent mild congestion, reactive airway disease, or viral infection. Clinical correlation is recommended. No  focal consolidation, pleural effusion, or pneumothorax. The cardiac silhouette is within limits. No acute osseous pathology. IMPRESSION: No focal consolidation. Interstitial and peribronchial densities as above. Electronically Signed   By: Anner Crete M.D.   On: 12/08/2019 22:54    Cardiac Studies   Echo pending  Assessment   Principal Problem:   New onset a-fib [facility]) Active Problems:   Hyperlipidemia   Non-intractable vomiting   Hypokalemia   Gastroenteritis   Hyperglycemia   Atrial fibrillation with RVR (HCC)   NSTEMI (non-ST elevated myocardial infarction) ([place])   Plan   1. PAF - Ms. Beattie has new onset atrial fibrillation but  fortunately converted back to sinus rhythm.  An echocardiogram is pending.  She was volume overloaded and is responded to Lasix.  She is now 95% on room air without any dyspnea or need for BiPAP.  I agree with metoprolol and at this point she is on aspirin.  Her CHA2DS2-VASc score is 2 for hypertension and female sex and she may ultimately need long-term anticoagulation.  This was short duration A. fib possibly related to electrolyte abnormalities and vomiting. 2. Pulmonary edema - this was acute such as flash pulmonary edema, ?related to afib or NSTEMI - echo is pending. Responded to lasix after volume challenge - lungs clear at this point. 3. NSTEMI - Troponin has resulted very high today 2285, higher than expected for demand ischemia after initial troponin was negative.  Would further trend troponins.  Recommend starting IV heparin and we may need to consider cardiac catheterization next week.  She denies chest pain. 4. Dyslipidemia - Her cholesterol is quite high including LDL of 207 (was 158 approx 5 years ago) -would recommend dietary modification and start high potency statin - ?familial hyperlipidemia. 5. N/V/ABD pain - per hospitalist service - keep K>4 and Mg >2.  Time Spent Directly with Patient:  I have spent a total of 35 minutes with the patient reviewing hospital notes, telemetry, EKGs, labs and examining the patient as well as establishing an assessment and plan that was discussed personally with the patient.  > 50% of time was spent in direct patient care.  Length of Stay:  LOS: 0 days   Pixie Casino, MD, [facility], [place] Director of the Advanced Lipid Disorders &  Cardiovascular Risk Reduction Clinic Diplomate of the American Board of Clinical Lipidology Attending Cardiologist  Direct Dial: [phone]  Fax: [phone]  Website:  [email or web address] Bento 12/09/2019, 10:59 AM

## 2019-12-09 NOTE — ED Notes (Signed)
Pt placed on defibrillator pads at this time.

## 2019-12-09 NOTE — Progress Notes (Signed)
Came to assess the patient at bedside - very tachypneic, tachycardic - on diltiazem 10mg /hr  - lung sounds- coarse, crackles+, she was hypoxic 82% and was already on 10lts [place] D/w ER physician She had gotten 2lts IVF, stopped further IVF, k already replaced  Plan: ABG- acidotic pH 7.3, pCo2 40, p02 60 - IV lasix 40mg  x1 ordered  - BIPAP for couple hours, reassess and repeat an ABG - hold further IVF - she converted back to NSR (wean diltiazem gtt)  - start her on metoprolol in am Will inform Hospitalist team  Will follow along  Renae Fickle, MD Cardiology

## 2019-12-09 NOTE — ED Notes (Signed)
Change of shift. First contact. Pt resting in bed. NADN. AFVSS

## 2019-12-09 NOTE — ED Provider Notes (Signed)
I was asked to place orders for patient She is now tachypneic Cardiology fellow had requested cardizem drip for afib with RVR She is now tachypniec and hypoxic He is suspicious for pulmonary edema as pt was given IV fluids Will obtain repeat CXR  One dose of lasix ordered (already given potassium) She may need bipap as well   Ripley Fraise, MD 12/09/19 [phone]

## 2019-12-10 LAB — CBC
HCT: 36.7 % (ref 36.0–46.0)
Hemoglobin: 11.7 g/dL — ABNORMAL LOW (ref 12.0–15.0)
MCH: 25.8 pg — ABNORMAL LOW (ref 26.0–34.0)
MCHC: 31.9 g/dL (ref 30.0–36.0)
MCV: 81 fL (ref 80.0–100.0)
Platelets: 290 10*3/uL (ref 150–400)
RBC: 4.53 MIL/uL (ref 3.87–5.11)
RDW: 14 % (ref 11.5–15.5)
WBC: 9.9 10*3/uL (ref 4.0–10.5)
nRBC: 0 % (ref 0.0–0.2)

## 2019-12-10 LAB — BASIC METABOLIC PANEL
Anion gap: 9 (ref 5–15)
Anion gap: 10 (ref 5–15)
BUN: 6 mg/dL (ref 6–20)
BUN: 6 mg/dL (ref 6–20)
CO2: 26 mmol/L (ref 22–32)
CO2: 26 mmol/L (ref 22–32)
Calcium: 8.8 mg/dL — ABNORMAL LOW (ref 8.9–10.3)
Calcium: 9.3 mg/dL (ref 8.9–10.3)
Chloride: 105 mmol/L (ref 98–111)
Chloride: 104 mmol/L (ref 98–111)
Creatinine, Ser: 0.62 mg/dL (ref 0.44–1.00)
Creatinine, Ser: 0.5 mg/dL (ref 0.44–1.00)
GFR, Estimated: >60 mL/min (ref >60)
GFR, Estimated: >60 mL/min (ref >60)
Glucose, Bld: 118 mg/dL — ABNORMAL HIGH (ref 70–99)
Glucose, Bld: 163 mg/dL — ABNORMAL HIGH (ref 70–99)
Potassium: 3.3 mmol/L — ABNORMAL LOW (ref 3.5–5.1)
Potassium: 3.9 mmol/L (ref 3.5–5.1)
Sodium: 140 mmol/L (ref 135–145)
Sodium: 140 mmol/L (ref 135–145)

## 2019-12-10 LAB — MAGNESIUM: Magnesium: 1.8 mg/dL (ref 1.7–2.4)

## 2019-12-10 LAB — HEPARIN LEVEL (UNFRACTIONATED)
Heparin Unfractionated: 0.23 IU/mL — ABNORMAL LOW (ref 0.30–0.70)
Heparin Unfractionated: 0.3 IU/mL (ref 0.30–0.70)
Heparin Unfractionated: 0.31 IU/mL (ref 0.30–0.70)

## 2019-12-10 MED ORDER — POTASSIUM CHLORIDE CRYS ER 20 MEQ PO TBCR
40.0000 meq | EXTENDED_RELEASE_TABLET | Freq: Once | ORAL | Status: AC
  Administered 2019-12-10: 40 meq via ORAL
  Filled 2019-12-10: qty 2

## 2019-12-10 MED ORDER — SODIUM CHLORIDE 0.9 % WEIGHT BASED INFUSION
1.0000 mL/kg/h | INTRAVENOUS | Status: DC
  Administered 2019-12-10 – 2019-12-11 (×2): 1 mL/kg/h via INTRAVENOUS

## 2019-12-10 MED ORDER — SODIUM CHLORIDE 0.9% FLUSH
3.0000 mL | Freq: Two times a day (BID) | INTRAVENOUS | Status: DC
  Administered 2019-12-10: 3 mL via INTRAVENOUS

## 2019-12-10 MED ORDER — SODIUM CHLORIDE 0.9 % IV SOLN: 250.0000 mL | INTRAVENOUS | Status: DC | PRN

## 2019-12-10 MED ORDER — SODIUM CHLORIDE 0.9% FLUSH: 3.0000 mL | INTRAVENOUS | Status: DC | PRN

## 2019-12-10 NOTE — Progress Notes (Signed)
ANTICOAGULATION CONSULT NOTE - Follow Up Consult  Pharmacy Consult for Heparin Indication: NSTEMI  Allergies  Allergen Reactions  . Bee Venom     Body swelling    Patient Measurements: Height: 5\' 2"  (157.5 cm) Weight: 83.7 kg (184 lb 8.4 oz) IBW/kg (Calculated) : 50.1 Heparin Dosing Weight: 68.2 kg  Vital Signs: Temp: 98.3 F (36.8 C) (10/17 1702) Temp Source: Oral (10/17 1702) BP: 157/77 (10/17 1702) Pulse Rate: 77 (10/17 1702)  Labs: Recent Labs    12/08/19 2148 12/09/19 0144 12/09/19 0410 12/09/19 0410 12/09/19 0825 12/09/19 1255 12/09/19 1737 12/10/19 0122 12/10/19 0824 12/10/19 1522 12/10/19 1752  HGB 12.2   < > 13.3   < > 12.2  --   --  11.7*  --   --   --   HCT 38.8   < > 39.0  --  39.2  --   --  36.7  --   --   --   PLT 298  --   --   --  287  --   --  290  --   --   --   HEPARINUNFRC  --   --   --   --   --   --    < > 0.23* 0.30  --  0.31  CREATININE 0.59   < >  --   --  0.57  --   --  0.62  --  0.50  --   TROPONINIHS 4  --   --   --  2,285* 2,676*  --   --   --   --   --    < > = values in this interval not displayed.    Estimated Creatinine Clearance: 85.3 mL/min (by C-G formula based on SCr of 0.5 mg/dL).   Medications:  Scheduled:  . aspirin EC  81 mg Oral Daily  . atorvastatin  80 mg Oral QHS  . metoprolol tartrate  12.5 mg Oral BID  . potassium chloride SA  40 mEq Oral Once  . sodium chloride flush  3 mL Intravenous Q12H   Infusions:  . diltiazem (CARDIZEM) infusion Stopped (12/09/19 0815)  . heparin 1,100 Units/hr (12/10/19 1152)  . lactated ringers 125 mL/hr at 12/09/19 1422    Assessment: 49 yo F initially presented to the ED with N/V.  Now with elevated trop and suspected NSTEMI.  Pt was started on heparin 10/16 PM.  Heparin level at low end of goal on 1100 units/hr.  No bleeding noted.  Goal of Therapy:  Heparin level 0.3-0.7 units/ml Monitor platelets by anticoagulation protocol: Yes   Plan:  Continue heparin drip rate 1100  units/hr Daily heparin level and CBC cards plans for cath tomorrow  Bonnita Nasuti Pharm.D. CPP, BCPS Clinical Pharmacist [phone] 12/10/2019 7:20 PM     **Pharmacist phone directory can be found on [email or web address] listed under [place].  12/10/2019 7:15 PM

## 2019-12-10 NOTE — Progress Notes (Signed)
PROGRESS NOTE  Dana Rojas TSV:[id number] DOB: [birth year] DOA: 12/08/2019 PCP: Dana Rojas  Brief History   The patient is a 49 yr old woman who presented to [facility] ED on 12/08/2019 with complaints of nausea, vomiting, and abdominal pain. Her illness began at 4pm when she had received her flu shot. By 7 pm she had nausea and vomiting with generalized weakness. There was also abdominal cramping and diarrhea. She thought that it may have had something to do with [place] food that she ate around noon. EMS was called and they found the patient to be bradycardic in the 40's.   In the ED the patient was given IV fluids. She then developed tachycardia with HR in th 140-160 range. EKG demonstrated atrial fibrillation. He was also found to be hypokalemic at 2.7.   The patient has a past medical history significant for vitamin D Deficiency.  [facility] were consulted to admit the patient for further evaluation and treatment.   The patient is resting comfortably. She is awake, alert, and oriented x 3. No acute distress. Heart and lung exams were within normal limits. Abdomen is soft, non-tender, non-distended. Extremities are  Without cyanosis, clubbing or edema.   Cardiology has been consulted and they ordered an echocardiogram. It has demonstrated an EF of 60-65%. LVED if is normal. No regional wall motion abnormalities. There is mild LVH. Left ventricular diastolic parameters were normal. Due to her very high troponins there is substantial suspicion of ACS. Plan is for [facility] tomorrow.  Consultants  . Cardiology  Procedures  . None  Antibiotics   Anti-infectives (From admission, onward)   None    .  Subjective  The patient is resting comfortably. She denies any further GI symptoms.  Objective   Vitals:  Vitals:   12/10/19 0725 12/10/19 0925  BP: 132/78   Pulse: 75 84  Resp: 20   Temp: 98.3 F (36.8 C)   SpO2: 93%    Exam:  Constitutional:  . The patient is  awake, alert, and oriented x 3. No acute distress. Respiratory:  . No increased work of breathing. . No wheezes, rales, or rhonchi . No tactile fremitus Cardiovascular:  . Regular rate and rhythm . No murmurs, ectopy, or gallups. . No lateral PMI. No thrills. Abdomen:  . Abdomen is soft, non-tender, non-distended . No hernias, masses, or organomegaly . Normoactive bowel sounds.  Musculoskeletal:  . No cyanosis, clubbing, or edema Skin:  . No rashes, lesions, ulcers . palpation of skin: no induration or nodules Neurologic:  . CN 2-12 intact . Sensation all 4 extremities intact Psychiatric:  . Mental status o Mood, affect appropriate o Orientation to person, place, time  . judgment and insight appear intact  I have personally reviewed the following:   Today's Data  . Vitals, BMP, CBC  Imaging  . CXR: worsening interstitial edema  Cardiology Data  . EKG . Echocardiogram  Scheduled Meds: . aspirin EC  81 mg Oral Daily  . atorvastatin  80 mg Oral QHS  . metoprolol tartrate  12.5 mg Oral BID  . potassium chloride SA  40 mEq Oral Once  . sodium chloride flush  3 mL Intravenous Q12H   Continuous Infusions: . diltiazem (CARDIZEM) infusion Stopped (12/09/19 0815)  . heparin 1,100 Units/hr (12/10/19 1152)  . lactated ringers 125 mL/hr at 12/09/19 1422    Principal Problem:   New onset a-fib [facility] Active Problems:   Hyperlipidemia   Non-intractable vomiting   Hypokalemia  Gastroenteritis   Hyperglycemia   Atrial fibrillation with RVR (HCC)   NSTEMI (non-ST elevated myocardial infarction) ([facility]ana [facility]jas)   LOS: 1 day   A & P  New onset a-fib [facility]): Now converted to sinus rhythm. CHADSVASC 2 score is 2. Atrial fibrillation was of short duration. Dana Rojas is admitted to cardiac telemetry unit.  She has converted to sinus on metoprolol. Electrolytes are being monitored with goal magnesium greater than 2 and potassium greater than 4 per cardiology  recommendation.  NSTEMI/ACS: Troponins were very elevated to 2285 and then 2676. Cardiology feels that this is out of scale to level of demand ischemia that can be explained by atrial fibrillation alone. Echocardiogram has been perfomormed.  It has demonstrated an EF of 60-65%. LVED if is normal. No regional wall motion abnormalities. There is mild LVH. Left ventricular diastolic parameters were normal. Due to her very high troponins there is substantial suspicion of ACS. Plan is for [facility] tomorrow.  Hypokalemia: Initial potassium was 2.7.  3.3 this morning. Supplement and monitor. Maintain K of greater than 4 and magnesium greater than 2 as per cardiology recommendation. Monitor.  Non-intractable vomiting: Resolved. Phenergan provided as needed for nausea and vomiting.  Secondary to probable gastroenteritis.  Gastroenteritis: Resolved. Diet has been advanced to heart healthy. She will be NPO after midnight.  Hyperglycemia: Blood sugar elevated when she arrived in the emergency room.  We will recheck blood sugar in the morning.    I have seen and examined this patient myself. I have spent 35 minutes in her evaluation and care.  DVT prophylaxis:      Lovenox for DVT prophylaxis. Code Status:              Full code Family Communication:       None available Disposition Plan:              Patient is from:                        Home             Anticipated DC to:                   Home             Anticipated DC date:               Anticipate greater than 2 midnight stay in the hospital to treat acute medical condition             Anticipated DC barriers:         No barriers to discharge identified at this time   Bunk Foss, DO [facility] Direct contact: see [email or web address]  7PM-7AM contact night coverage as above 12/10/2019, 3:04 PM  LOS: 1 day

## 2019-12-10 NOTE — Progress Notes (Signed)
DAILY PROGRESS NOTE   Patient Name: Dana Rojas Date of Encounter: 12/10/2019 Cardiologist: No primary care provider on file.  Chief Complaint   No complaints  Patient Profile   49 yo female who presented with dyspnea and tachycardia was found to be in A. fib with RVR.  Felt to be volume overloaded and required BiPAP and Lasix.  Subsequently she converted back to sinus rhythm.  Subjective   No chest pain overnight - maintaining sinus rhythm. Troponin peaked at 2676. Potassium low at 3.3 today. Mild decrease in H/H to 11.7/36.7, on IV Heparin. Weight up 2KG compared to yesterday. Echo yesterday shows LVEF 60-65% with normal wall motion, normal RV function.  Objective   Vitals:   12/10/19 0014 12/10/19 0426 12/10/19 0725 12/10/19 0925  BP: 136/82 (!) 145/86 132/78   Pulse: 80 80 75 84  Resp: 20 16 20    Temp: 98.6 F (37 C) 98.2 F (36.8 C) 98.3 F (36.8 C)   TempSrc: Oral Oral Oral   SpO2: 99% 96% 93%   Weight:      Height:        Intake/Output Summary (Last 24 hours) at 12/10/2019 1125 Last data filed at 12/10/2019 0346 Gross per 24 hour  Intake 984.67 ml  Output --  Net 984.67 ml   Filed Weights   12/08/19 2143 12/09/19 1515  Weight: 81.2 kg 83.7 kg    Physical Exam   General appearance: alert and no distress Neck: no carotid bruit, no JVD and thyroid not enlarged, symmetric, no tenderness/mass/nodules Lungs: clear to auscultation bilaterally Heart: regular rate and rhythm Abdomen: soft, non-tender; bowel sounds normal; no masses,  no organomegaly Extremities: extremities normal, atraumatic, no cyanosis or edema Pulses: 2+ and symmetric Skin: Skin color, texture, turgor normal. No rashes or lesions Neurologic: Grossly normal Psych: Pleasant  Inpatient Medications    Scheduled Meds: . aspirin EC  81 mg Oral Daily  . atorvastatin  80 mg Oral QHS  . metoprolol tartrate  12.5 mg Oral BID  . potassium chloride SA  40 mEq Oral Once     Continuous Infusions: . diltiazem (CARDIZEM) infusion Stopped (12/09/19 0815)  . heparin 1,000 Units/hr (12/10/19 0346)  . lactated ringers 125 mL/hr at 12/09/19 1422    PRN Meds: acetaminophen, ondansetron (ZOFRAN) IV, promethazine   Labs   Results for orders placed or performed during the hospital encounter of 12/08/19 (from the past 48 hour(s))  Lipase, blood     Status: None   Collection Time: 12/08/19  9:48 PM  Result Value Ref Range   Lipase 31 11 - 51 U/L    Comment: Performed at [facility], [address], [place], [place] [address]  Comprehensive metabolic panel     Status: Abnormal   Collection Time: 12/08/19  9:48 PM  Result Value Ref Range   Sodium 137 135 - 145 mmol/L   Potassium 2.7 (LL) 3.5 - 5.1 mmol/L    Comment: CRITICAL RESULT CALLED TO, READ BACK BY AND VERIFIED WITH: J.GLOSTER,RN @2245  12/08/2019 VANG.J    Chloride 102 98 - 111 mmol/L   CO2 23 22 - 32 mmol/L   Glucose, Bld 232 (H) 70 - 99 mg/dL    Comment: Glucose reference range applies only to samples taken after fasting for at least 8 hours.   BUN 13 6 - 20 mg/dL   Creatinine, Ser 0.59 0.44 - 1.00 mg/dL   Calcium 8.8 (L) 8.9 - 10.3 mg/dL   Total Protein 7.0  6.5 - 8.1 g/dL   Albumin 4.0 3.5 - 5.0 g/dL   AST 60 (H) 15 - 41 U/L   ALT 84 (H) 0 - 44 U/L   Alkaline Phosphatase 80 38 - 126 U/L   Total Bilirubin 0.6 0.3 - 1.2 mg/dL   GFR, Estimated >60 >60 mL/min   Anion gap 12 5 - 15    Comment: Performed at [facility] [address] [place], [place] [address]  CBC     Status: Abnormal   Collection Time: 12/08/19  9:48 PM  Result Value Ref Range   WBC 11.0 (H) 4.0 - 10.5 K/uL   RBC 4.76 3.87 - 5.11 MIL/uL   Hemoglobin 12.2 12.0 - 15.0 g/dL   HCT 38.8 36 - 46 %   MCV 81.5 80.0 - 100.0 fL   MCH 25.6 (L) 26.0 - 34.0 pg   MCHC 31.4 30.0 - 36.0 g/dL   RDW 13.6 11.5 - 15.5 %   Platelets 298 150 - 400 K/uL   nRBC 0.0 0.0 - 0.2 %    Comment: Performed at [facility],  [place] [address] [place], [place] [address]  Troponin I (High Sensitivity)     Status: None   Collection Time: 12/08/19  9:48 PM  Result Value Ref Range   Troponin I (High Sensitivity) 4 <18 ng/L    Comment: (NOTE) Elevated high sensitivity troponin I (hsTnI) values and significant  changes across serial measurements may suggest ACS but many other  chronic and acute conditions are known to elevate hsTnI results.  Refer to the "Links" section for chest pain algorithms and additional  guidance. Performed at [facility], [place] [address], [place], [place] [address]   Magnesium     Status: None   Collection Time: 12/08/19  9:48 PM  Result Value Ref Range   Magnesium 1.7 1.7 - 2.4 mg/dL    Comment: Performed at [facility], [place] [address], [place], [place] [address]  I-Stat beta hCG blood, ED     Status: None   Collection Time: 12/08/19 10:02 PM  Result Value Ref Range   I-stat hCG, quantitative <5.0 <5 mIU/mL   Comment 3            Comment:   GEST. AGE      CONC.  (mIU/mL)   <=1 WEEK        5 - 50     2 WEEKS       50 - 500     3 WEEKS       100 - 10,000     4 WEEKS     1,000 - 30,000        FEMALE AND NON-PREGNANT FEMALE:     LESS THAN 5 mIU/mL   Respiratory Panel by RT PCR (Flu A&B, Covid) - Nasopharyngeal Swab     Status: None   Collection Time: 12/08/19 11:36 PM   Specimen: Nasopharyngeal Swab  Result Value Ref Range   SARS Coronavirus 2 by RT PCR NEGATIVE NEGATIVE    Comment: (NOTE) SARS-CoV-2 target nucleic acids are NOT DETECTED.  The SARS-CoV-2 RNA is generally detectable in upper respiratoy specimens during the acute phase of infection. The lowest concentration of SARS-CoV-2 viral copies this assay can detect is 131 copies/mL. A negative result does not preclude SARS-Cov-2 infection and should not be used as the sole basis for treatment or other patient management decisions. A negative result may occur with  improper specimen collection/handling, submission of  specimen other than nasopharyngeal swab, presence of viral mutation(s) within the areas targeted by this assay, and inadequate number of viral copies (<131 copies/mL). A negative result must be combined with clinical observations, patient history, and epidemiological information. The expected result is Negative.  Fact Sheet for Patients:  PinkCheek.be  Fact Sheet for Healthcare Providers:  GravelBags.it  This test is no t yet approved or cleared by the [place] FDA and  has been authorized for detection and/or diagnosis of SARS-CoV-2 by FDA under an Emergency Use Authorization (EUA). This EUA will remain  in effect (meaning this test can be used) for the duration of the COVID-19 declaration under Section 564(b)(1) of the Act, 21 U.S.C. section 360bbb-3(b)(1), unless the authorization is terminated or revoked sooner.     Influenza A by PCR NEGATIVE NEGATIVE   Influenza B by PCR NEGATIVE NEGATIVE    Comment: (NOTE) The Xpert Xpress SARS-CoV-2/FLU/RSV assay is intended as an aid in  the diagnosis of influenza from Nasopharyngeal swab specimens and  should not be used as a sole basis for treatment. Nasal washings and  aspirates are unacceptable for Xpert Xpress SARS-CoV-2/FLU/RSV  testing.  Fact Sheet for Patients: PinkCheek.be  Fact Sheet for Healthcare Providers: GravelBags.it  This test is not yet approved or cleared by the [place] FDA and  has been authorized for detection and/or diagnosis of SARS-CoV-2 by  FDA under an Emergency Use Authorization (EUA). This EUA will remain  in effect (meaning this test can be used) for the duration of the  Covid-19 declaration under Section 564(b)(1) of the Act, 21  U.S.C. section 360bbb-3(b)(1), unless the authorization is  terminated or revoked. Performed at [facility], [place] [address] [place],  [place] [address]   D-dimer, quantitative (not at [facility])     Status: None   Collection Time: 12/09/19 12:52 AM  Result Value Ref Range   D-Dimer, Quant 0.45 0.00 - 0.50 ug/mL-FEU    Comment: (NOTE) At the manufacturer cut-off value of 0.5 g/mL FEU, this assay has a negative predictive value of 95-100%.This assay is intended for use in conjunction with a clinical pretest probability (PTP) assessment model to exclude pulmonary embolism (PE) and deep venous thrombosis (DVT) in outpatients suspected of PE or DVT. Results should be correlated with clinical presentation. Performed at [facility], [place] [address], [place], [place] [address]   I-Stat arterial blood gas, ED     Status: Abnormal   Collection Time: 12/09/19  1:44 AM  Result Value Ref Range   pH, Arterial 7.331 (L) 7.35 - 7.45   pCO2 arterial 40.8 32 - 48 mmHg   pO2, Arterial 60 (L) 83 - 108 mmHg   Bicarbonate 21.5 20.0 - 28.0 mmol/L   TCO2 23 22 - 32 mmol/L   O2 Saturation 88.0 %   Acid-base deficit 4.0 (H) 0.0 - 2.0 mmol/L   Sodium 140 135 - 145 mmol/L   Potassium 3.7 3.5 - 5.1 mmol/L   Calcium, Ion 1.07 (L) 1.15 - 1.40 mmol/L   HCT 36.0 36 - 46 %   Hemoglobin 12.2 12.0 - 15.0 g/dL   Patient temperature 99.0 F    Collection site Radial    Drawn by RT    Sample type ARTERIAL   Urinalysis, Routine w reflex microscopic     Status: Abnormal   Collection Time: 12/09/19  3:12 AM  Result Value Ref Range   Color, Urine COLORLESS (A) YELLOW  APPearance CLEAR CLEAR   Specific Gravity, Urine 1.005 1.005 - 1.030   pH 6.0 5.0 - 8.0   Glucose, UA >=500 (A) NEGATIVE mg/dL   Hgb urine dipstick NEGATIVE NEGATIVE   Bilirubin Urine NEGATIVE NEGATIVE   Ketones, ur 5 (A) NEGATIVE mg/dL   Protein, ur NEGATIVE NEGATIVE mg/dL   Nitrite NEGATIVE NEGATIVE   Leukocytes,Ua NEGATIVE NEGATIVE   RBC / HPF 0-5 0 - 5 RBC/hpf   WBC, UA 0-5 0 - 5 WBC/hpf   Bacteria, UA NONE SEEN NONE SEEN    Comment: Performed at [facility] [address], [place], [place] [address]  I-Stat arterial blood gas, ED     Status: Abnormal   Collection Time: 12/09/19  4:10 AM  Result Value Ref Range   pH, Arterial 7.382 7.35 - 7.45   pCO2 arterial 40.4 32 - 48 mmHg   pO2, Arterial 106 83 - 108 mmHg   Bicarbonate 24.2 20.0 - 28.0 mmol/L   TCO2 25 22 - 32 mmol/L   O2 Saturation 98.0 %   Acid-base deficit 1.0 0.0 - 2.0 mmol/L   Sodium 141 135 - 145 mmol/L   Potassium 3.7 3.5 - 5.1 mmol/L   Calcium, Ion 1.07 (L) 1.15 - 1.40 mmol/L   HCT 39.0 36 - 46 %   Hemoglobin 13.3 12.0 - 15.0 g/dL   Patient temperature 97.0 F    Collection site Radial    Drawn by RT    Sample type ARTERIAL   HIV Antibody (routine testing w rflx)     Status: None   Collection Time: 12/09/19  8:25 AM  Result Value Ref Range   HIV Screen 4th Generation wRfx Non Reactive Non Reactive    Comment: Performed at [facility], [place] [address], [place], [place] [address]  Basic metabolic panel     Status: Abnormal   Collection Time: 12/09/19  8:25 AM  Result Value Ref Range   Sodium 141 135 - 145 mmol/L   Potassium 3.7 3.5 - 5.1 mmol/L   Chloride 105 98 - 111 mmol/L   CO2 24 22 - 32 mmol/L   Glucose, Bld 153 (H) 70 - 99 mg/dL    Comment: Glucose reference range applies only to samples taken after fasting for at least 8 hours.   BUN 8 6 - 20 mg/dL   Creatinine, Ser 0.57 0.44 - 1.00 mg/dL   Calcium 8.2 (L) 8.9 - 10.3 mg/dL   GFR, Estimated >60 >60 mL/min   Anion gap 12 5 - 15    Comment: Performed at [facility] [address], [place], [place] [address]  CBC     Status: Abnormal   Collection Time: 12/09/19  8:25 AM  Result Value Ref Range   WBC 10.9 (H) 4.0 - 10.5 K/uL   RBC 4.73 3.87 - 5.11 MIL/uL   Hemoglobin 12.2 12.0 - 15.0 g/dL   HCT 39.2 36 - 46 %   MCV 82.9 80.0 - 100.0 fL   MCH 25.8 (L) 26.0 - 34.0 pg   MCHC 31.1 30.0 - 36.0 g/dL   RDW 14.0 11.5 - 15.5 %   Platelets 287 150 - 400 K/uL   nRBC 0.0 0.0 - 0.2 %    Comment: Performed at [facility], [place] [address], [place], [place] [address]  Troponin I (High Sensitivity)     Status: Abnormal   Collection Time: 12/09/19  8:25 AM  Result Value Ref Range  Troponin I (High Sensitivity) 2,285 (HH) <18 ng/L    Comment: CRITICAL RESULT CALLED TO, READ BACK BY AND VERIFIED WITH: L.SHULAR RN @ [phone] 10.16.2021 BY C.EDENS (NOTE) Elevated high sensitivity troponin I (hsTnI) values and significant  changes across serial measurements may suggest ACS but many other  chronic and acute conditions are known to elevate hsTnI results.  Refer to the Links section for chest pain algorithms and additional  guidance. Performed at [facility], [place] [address] [place], [place] [address]   Lipid panel     Status: Abnormal   Collection Time: 12/09/19  8:25 AM  Result Value Ref Range   Cholesterol 285 (H) 0 - 200 mg/dL   Triglycerides 219 (H) <150 mg/dL   HDL 34 (L) >40 mg/dL   Total CHOL/HDL Ratio 8.4 RATIO   VLDL 44 (H) 0 - 40 mg/dL   LDL Cholesterol 207 (H) 0 - 99 mg/dL    Comment:        Total Cholesterol/HDL:CHD Risk Coronary Heart Disease Risk Table                     Men   Women  1/2 Average Risk   3.4   3.3  Average Risk       5.0   4.4  2 X Average Risk   9.6   7.1  3 X Average Risk  23.4   11.0        Use the calculated Patient Ratio above and the CHD Risk Table to determine the patient's CHD Risk.        ATP III CLASSIFICATION (LDL):  <100     mg/dL   Optimal  100-129  mg/dL   Near or Above                    Optimal  130-159  mg/dL   Borderline  160-189  mg/dL   High  >190     mg/dL   Very High Performed at [facility] [address] [place], [place] [address]   Troponin I (High Sensitivity)     Status: Abnormal   Collection Time: 12/09/19 12:55 PM  Result Value Ref Range   Troponin I (High Sensitivity) 2,676 (HH) <18 ng/L    Comment: CRITICAL VALUE NOTED.  VALUE IS CONSISTENT WITH PREVIOUSLY REPORTED AND CALLED VALUE. (NOTE) Elevated high  sensitivity troponin I (hsTnI) values and significant  changes across serial measurements may suggest ACS but many other  chronic and acute conditions are known to elevate hsTnI results.  Refer to the Links section for chest pain algorithms and additional  guidance. Performed at [facility], [place] [address], [place], [place] [address]   Heparin level (unfractionated)     Status: None   Collection Time: 12/09/19  5:37 PM  Result Value Ref Range   Heparin Unfractionated 0.59 0.30 - 0.70 IU/mL    Comment: (NOTE) If heparin results are below expected values, and patient dosage has  been confirmed, suggest follow up testing of antithrombin III levels. Performed at [facility], [place] [address] [place], [place] [address]   CBC     Status: Abnormal   Collection Time: 12/10/19  1:22 AM  Result Value Ref Range   WBC 9.9 4.0 - 10.5 K/uL   RBC 4.53 3.87 - 5.11 MIL/uL   Hemoglobin 11.7 (L) 12.0 - 15.0 g/dL   HCT 36.7 36 - 46 %  MCV 81.0 80.0 - 100.0 fL   MCH 25.8 (L) 26.0 - 34.0 pg   MCHC 31.9 30.0 - 36.0 g/dL   RDW 14.0 11.5 - 15.5 %   Platelets 290 150 - 400 K/uL   nRBC 0.0 0.0 - 0.2 %    Comment: Performed at [facility] [address], [place], [place] [address]  Basic metabolic panel     Status: Abnormal   Collection Time: 12/10/19  1:22 AM  Result Value Ref Range   Sodium 140 135 - 145 mmol/L   Potassium 3.3 (L) 3.5 - 5.1 mmol/L   Chloride 105 98 - 111 mmol/L   CO2 26 22 - 32 mmol/L   Glucose, Bld 118 (H) 70 - 99 mg/dL    Comment: Glucose reference range applies only to samples taken after fasting for at least 8 hours.   BUN 6 6 - 20 mg/dL   Creatinine, Ser 0.62 0.44 - 1.00 mg/dL   Calcium 8.8 (L) 8.9 - 10.3 mg/dL   GFR, Estimated >60 >60 mL/min   Anion gap 9 5 - 15    Comment: Performed at [facility] [address] [place], [place] [address]  Heparin level (unfractionated)     Status: Abnormal   Collection Time: 12/10/19  1:22 AM  Result  Value Ref Range   Heparin Unfractionated 0.23 (L) 0.30 - 0.70 IU/mL    Comment: (NOTE) If heparin results are below expected values, and patient dosage has  been confirmed, suggest follow up testing of antithrombin III levels. Performed at [facility], [place] [address], [place], [place] [address]   Heparin level (unfractionated)     Status: None   Collection Time: 12/10/19  8:24 AM  Result Value Ref Range   Heparin Unfractionated 0.30 0.30 - 0.70 IU/mL    Comment: (NOTE) If heparin results are below expected values, and patient dosage has  been confirmed, suggest follow up testing of antithrombin III levels. Performed at [facility], [place] [address] [place], [place] [address]     ECG   N/A  Telemetry   Sinus rhythm - Personally Reviewed  Radiology    DG Chest Port 1 View  Result Date: 12/09/2019 CLINICAL DATA:  49 year old female with shortness of breath. EXAM: PORTABLE CHEST 1 VIEW COMPARISON:  Chest CT dated 08/17/2009 and radiograph dated 12/08/2019. FINDINGS: Interval progression of interstitial densities compared to the earlier radiograph likely representing worsening edema. Pneumonia is not excluded clinical correlation is recommended. Small bilateral pleural effusions suspected. No pneumothorax. Stable cardiac silhouette. No acute osseous pathology. IMPRESSION: Findings likely represent worsening edema. Pneumonia is not excluded. Clinical correlation is recommended. Electronically Signed   By: Anner Crete M.D.   On: 12/09/2019 02:18   DG Chest Port 1 View  Result Date: 12/08/2019 CLINICAL DATA:  49 year old female with vomiting. EXAM: PORTABLE CHEST 1 VIEW COMPARISON:  Chest radiograph dated 06/14/2019. FINDINGS: There is mild interstitial and peribronchial densities which may represent mild congestion, reactive airway disease, or viral infection. Clinical correlation is recommended. No focal consolidation, pleural effusion, or pneumothorax. The cardiac  silhouette is within limits. No acute osseous pathology. IMPRESSION: No focal consolidation. Interstitial and peribronchial densities as above. Electronically Signed   By: Anner Crete M.D.   On: 12/08/2019 22:54   ECHOCARDIOGRAM COMPLETE  Result Date: 12/09/2019    ECHOCARDIOGRAM REPORT   Patient Name:   Dana Rojas Date of Exam: 12/09/2019 Medical Rec #:  [id number]  Height:       62.0 in Accession #:    [id number]        Weight:       179.0 lb Date of Birth:  09-Jul-1970         BSA:          1.824 m Patient Age:    50 years          BP:           126/78 mmHg Patient Gender: F                 HR:           79 bpm. Exam Location:  Inpatient Procedure: 2D Echo, Cardiac Doppler, Color Doppler and Intracardiac            Opacification Agent Indications:    Atrial fibrillation                 NSTEMI  History:        Patient has no prior history of Echocardiogram examinations.                 Risk Factors:Diabetes. Dyspnea.  Sonographer:    Clayton Lefort RDCS (AE) Referring Phys: [id number] Nadean Corwin Ahnyla Mendel  Sonographer Comments: Suboptimal apical window and suboptimal subcostal window. IMPRESSIONS  1. Left ventricular ejection fraction, by estimation, is 60 to 65%. The left ventricle has normal function. The left ventricle has no regional wall motion abnormalities. There is mild left ventricular hypertrophy. Left ventricular diastolic parameters were normal.  2. Right ventricular systolic function is normal. The right ventricular size is normal. There is normal pulmonary artery systolic pressure. The estimated right ventricular systolic pressure is 07.3 mmHg.  3. The mitral valve is normal in structure. Mild mitral valve regurgitation. No evidence of mitral stenosis.  4. The aortic valve is tricuspid. Aortic valve regurgitation is not visualized. No aortic stenosis is present.  5. The inferior vena cava is normal in size with <50% respiratory variability, suggesting right atrial pressure of 8 mmHg. FINDINGS   Left Ventricle: Left ventricular ejection fraction, by estimation, is 60 to 65%. The left ventricle has normal function. The left ventricle has no regional wall motion abnormalities. Definity contrast agent was given IV to delineate the left ventricular  endocardial borders. The left ventricular internal cavity size was normal in size. There is mild left ventricular hypertrophy. Left ventricular diastolic parameters were normal. Right Ventricle: The right ventricular size is normal. No increase in right ventricular wall thickness. Right ventricular systolic function is normal. There is normal pulmonary artery systolic pressure. The tricuspid regurgitant velocity is 2.50 m/s, and  with an assumed right atrial pressure of 8 mmHg, the estimated right ventricular systolic pressure is 71.0 mmHg. Left Atrium: Left atrial size was normal in size. Right Atrium: Right atrial size was normal in size. Pericardium: There is no evidence of pericardial effusion. Mitral Valve: The mitral valve is normal in structure. Mild mitral valve regurgitation. No evidence of mitral valve stenosis. Tricuspid Valve: The tricuspid valve is normal in structure. Tricuspid valve regurgitation is mild . No evidence of tricuspid stenosis. Aortic Valve: The aortic valve is tricuspid. Aortic valve regurgitation is not visualized. No aortic stenosis is present. Aortic valve mean gradient measures 3.0 mmHg. Aortic valve peak gradient measures 6.5 mmHg. Aortic valve area, by VTI measures 2.51 cm. Pulmonic Valve: The pulmonic valve was grossly normal. Pulmonic valve regurgitation is trivial. No evidence of pulmonic stenosis. Aorta: The  aortic root is normal in size and structure. Venous: The inferior vena cava is normal in size with less than 50% respiratory variability, suggesting right atrial pressure of 8 mmHg. IAS/Shunts: The interatrial septum was not well visualized.  LEFT VENTRICLE PLAX 2D LVIDd:         3.40 cm  Diastology LVIDs:         2.20 cm   LV e' medial:    7.83 cm/s LV PW:         1.20 cm  LV E/e' medial:  11.1 LV IVS:        1.20 cm  LV e' lateral:   10.00 cm/s LVOT diam:     1.90 cm  LV E/e' lateral: 8.7 LV SV:         67 LV SV Index:   37 LVOT Area:     2.84 cm  RIGHT VENTRICLE             IVC RV Basal diam:  3.20 cm     IVC diam: 1.30 cm RV S prime:     12.20 cm/s TAPSE (M-mode): 1.7 cm LEFT ATRIUM             Index       RIGHT ATRIUM           Index LA diam:        3.30 cm 1.81 cm/m  RA Area:     15.20 cm LA Vol (A2C):   33.2 ml 18.21 ml/m RA Volume:   34.90 ml  19.14 ml/m LA Vol (A4C):   60.7 ml 33.28 ml/m LA Biplane Vol: 46.4 ml 25.44 ml/m  AORTIC VALVE AV Area (Vmax):    2.30 cm AV Area (Vmean):   2.51 cm AV Area (VTI):     2.51 cm AV Vmax:           127.00 cm/s AV Vmean:          87.300 cm/s AV VTI:            0.267 m AV Peak Grad:      6.5 mmHg AV Mean Grad:      3.0 mmHg LVOT Vmax:         103.00 cm/s LVOT Vmean:        77.300 cm/s LVOT VTI:          0.236 m LVOT/AV VTI ratio: 0.88  AORTA Ao Root diam: 3.00 cm Ao Asc diam:  2.80 cm MITRAL VALVE               TRICUSPID VALVE MV Area (PHT): 4.80 cm    TR Peak grad:   25.0 mmHg MV Decel Time: 158 msec    TR Vmax:        250.00 cm/s MV E velocity: 87.00 cm/s MV A velocity: 63.40 cm/s  SHUNTS MV E/A ratio:  1.37        Systemic VTI:  0.24 m                            Systemic Diam: 1.90 cm Cherlynn Kaiser MD Electronically signed by Cherlynn Kaiser MD Signature Date/Time: 12/09/2019/2:42:27 PM    Final     Cardiac Studies   Echo pending  Assessment   Principal Problem:   New onset a-fib ([place]) Active Problems:   Hyperlipidemia   Non-intractable vomiting   Hypokalemia   Gastroenteritis   Hyperglycemia   Atrial fibrillation  with RVR ([place])   NSTEMI (non-ST elevated myocardial infarction) ([place])   Plan   1. PAF - Ms. Fischman has new onset atrial fibrillation but fortunately converted back to sinus rhythm.  An echocardiogram is pending.  She was volume overloaded  and is responded to Lasix.  She is now 95% on room air without any dyspnea or need for BiPAP.  I agree with metoprolol and at this point she is on aspirin.  Her CHA2DS2-VASc score is 2 for hypertension and female sex and she may ultimately need long-term anticoagulation.  This was short duration A. fib possibly related to electrolyte abnormalities and vomiting or ACS - no recurrence overnight. On IV heparin. 2. Pulmonary edema - this was acute such as flash pulmonary edema, ?related to afib or NSTEMI - echo demonstrated normal LVEF with normal wall motion. Responded to lasix after volume challenge - lungs clear at this point. 3. NSTEMI - Troponin has resulted very high today 2285, higher than expected for demand ischemia after initial troponin was negative. Second value was slightly higher - no chest pain.  No better explanation than ACS - ?spontaneous dissection.  Continue IV heparin- she denies chest pain. Have recommended definitive LHC to her and her husband today after a long discussion - discussed risks, benefits and alternatives and she is willing to proceed tomorrow. 4. Dyslipidemia - Her cholesterol is quite high including LDL of 207 (was 158 approx 5 years ago) -would recommend dietary modification and start high potency statin - ?familial hyperlipidemia. 5. N/V/ABD pain - per hospitalist service - keep K>4 and Mg >2.  Time Spent Directly with Patient:  I have spent a total of 35 minutes with the patient reviewing hospital notes, telemetry, EKGs, labs and examining the patient as well as establishing an assessment and plan that was discussed personally with the patient.  > 50% of time was spent in direct patient care.  Length of Stay:  LOS: 1 day   Pixie Casino, MD, [facility], [place] Director of the [facility]  Card[facility]ovascular [facility] Diplomate of the American Board of Clinical Lipidology Attending Cardiologist  Direct  Dial: [phone]  Fax: [phone]  Website:  [email or web address] Krystopher Kuenzel 12/10/2019, 11:25 AM

## 2019-12-10 NOTE — Progress Notes (Signed)
ANTICOAGULATION CONSULT NOTE  Pharmacy Consult for heparin Indication: chest pain/ACS  Assessment: 54 YOF presenting with tachycardia and dyspnea, in Afib w/RVR.  Troponin also elevated and concern for NSTEMI.  Not on anticoagulation PTA, CBC wnl.  Heparin level this am 0.23 units/ml.  No issues noted with infusion.  No bleeding noted  Goal of Therapy:  Heparin level 0.3-0.7 units/ml Monitor platelets by anticoagulation protocol: Yes   Plan:  Increase heparin gtt to 1000 units/hr F/u 6h heparin level Daily heparin level, CBC, s/s bleeding F/u echo and cards plan  Thanks for allowing pharmacy to be a part of this patient's care.  Excell Seltzer, PharmD Clinical Pharmacist  12/10/2019 2:56 AM

## 2019-12-10 NOTE — Progress Notes (Signed)
ANTICOAGULATION CONSULT NOTE - Follow Up Consult  Pharmacy Consult for Heparin Indication: NSTEMI  Allergies  Allergen Reactions  . Bee Venom     Body swelling    Patient Measurements: Height: 5\' 2"  (157.5 cm) Weight: 83.7 kg (184 lb 8.4 oz) IBW/kg (Calculated) : 50.1 Heparin Dosing Weight: 68.2 kg  Vital Signs: Temp: 98.3 F (36.8 C) (10/17 0725) Temp Source: Oral (10/17 0725) BP: 132/78 (10/17 0725) Pulse Rate: 84 (10/17 0925)  Labs: Recent Labs    12/08/19 2148 12/09/19 0144 12/09/19 0410 12/09/19 0410 12/09/19 0825 12/09/19 1255 12/09/19 1737 12/10/19 0122 12/10/19 0824  HGB 12.2   < > 13.3   < > 12.2  --   --  11.7*  --   HCT 38.8   < > 39.0  --  39.2  --   --  36.7  --   PLT 298  --   --   --  287  --   --  290  --   HEPARINUNFRC  --   --   --   --   --   --  0.59 0.23* 0.30  CREATININE 0.59  --   --   --  0.57  --   --  0.62  --   TROPONINIHS 4  --   --   --  2,285* 2,676*  --   --   --    < > = values in this interval not displayed.    Estimated Creatinine Clearance: 85.3 mL/min (by C-G formula based on SCr of 0.62 mg/dL).   Medications:  Scheduled:  . aspirin EC  81 mg Oral Daily  . atorvastatin  80 mg Oral QHS  . metoprolol tartrate  12.5 mg Oral BID  . potassium chloride SA  40 mEq Oral Once   Infusions:  . diltiazem (CARDIZEM) infusion Stopped (12/09/19 0815)  . heparin 1,000 Units/hr (12/10/19 0346)  . lactated ringers 125 mL/hr at 12/09/19 1422    Assessment: 49 yo F initially presented to the ED with N/V.  Now with elevated trop and suspected NSTEMI.  Pt was started on heparin 10/16 PM.  Heparin level at low end of goal on 1000 units/hr.  No bleeding noted.  Goal of Therapy:  Heparin level 0.3-0.7 units/ml Monitor platelets by anticoagulation protocol: Yes   Plan:  Increase heparin to 1100 units/hr Next heparin level in 6 hours for confirmation Daily heparin level and CBC Follow-up cards plans for cath  [facility],  Pharm.D., BCPS Clinical Pharmacist Clinical phone for 12/10/2019 from 8:30-4:00 is [phone].  **Pharmacist phone directory can be found on [email or web address] listed under [place].  12/10/2019 11:26 AM

## 2019-12-11 ENCOUNTER — Encounter (HOSPITAL_COMMUNITY): Payer: Self-pay | Admitting: Cardiovascular Disease

## 2019-12-11 ENCOUNTER — Inpatient Hospital Stay (HOSPITAL_COMMUNITY)
Admission: EM | Disposition: A | Discharge status: Home / Self Care | Payer: Self-pay | Source: Home / Self Care | Attending: Internal Medicine

## 2019-12-11 DIAGNOSIS — E782 Mixed hyperlipidemia: Secondary | ICD-10-CM

## 2019-12-11 DIAGNOSIS — R739 Hyperglycemia, unspecified: Secondary | ICD-10-CM

## 2019-12-11 HISTORY — PX: LEFT HEART CATH AND CORONARY ANGIOGRAPHY: CATH118249

## 2019-12-11 LAB — HEMOGLOBIN A1C
Hgb A1c MFr Bld: 7.9 % — ABNORMAL HIGH (ref 4.8–5.6)
Hgb A1c MFr Bld: 7.9 % — ABNORMAL HIGH (ref 4.8–5.6)
Mean Plasma Glucose: 180 mg/dL
Mean Plasma Glucose: 180 mg/dL

## 2019-12-11 LAB — CBC
HCT: 36.4 % (ref 36.0–46.0)
Hemoglobin: 11.7 g/dL — ABNORMAL LOW (ref 12.0–15.0)
MCH: 25.9 pg — ABNORMAL LOW (ref 26.0–34.0)
MCHC: 32.1 g/dL (ref 30.0–36.0)
MCV: 80.7 fL (ref 80.0–100.0)
Platelets: 258 10*3/uL (ref 150–400)
RBC: 4.51 MIL/uL (ref 3.87–5.11)
RDW: 13.9 % (ref 11.5–15.5)
WBC: 6.7 10*3/uL (ref 4.0–10.5)
nRBC: 0 % (ref 0.0–0.2)

## 2019-12-11 LAB — HEPARIN LEVEL (UNFRACTIONATED): Heparin Unfractionated: 0.35 IU/mL (ref 0.30–0.70)

## 2019-12-11 SURGERY — LEFT HEART CATH AND CORONARY ANGIOGRAPHY
Anesthesia: LOCAL

## 2019-12-11 MED ORDER — ONDANSETRON HCL 4 MG/2ML IJ SOLN: 4.0000 mg | Freq: Four times a day (QID) | INTRAMUSCULAR | Status: DC | PRN

## 2019-12-11 MED ORDER — HEPARIN (PORCINE) IN NACL 1000-0.9 UT/500ML-% IV SOLN
INTRAVENOUS | Status: DC | PRN
  Administered 2019-12-11 (×2): 500 mL

## 2019-12-11 MED ORDER — VERAPAMIL HCL 2.5 MG/ML IV SOLN
INTRAVENOUS | Status: AC
  Filled 2019-12-11: qty 2

## 2019-12-11 MED ORDER — SODIUM CHLORIDE 0.9% FLUSH: 3.0000 mL | INTRAVENOUS | Status: DC | PRN

## 2019-12-11 MED ORDER — LABETALOL HCL 5 MG/ML IV SOLN: 10.0000 mg | INTRAVENOUS | Status: DC | PRN

## 2019-12-11 MED ORDER — METOPROLOL SUCCINATE ER 25 MG PO TB24: 25.0000 mg | ORAL_TABLET | Freq: Every day | ORAL | 11 refills | Status: DC

## 2019-12-11 MED ORDER — ACETAMINOPHEN 325 MG PO TABS: 650.0000 mg | ORAL_TABLET | ORAL | Status: DC | PRN

## 2019-12-11 MED ORDER — HEPARIN SODIUM (PORCINE) 1000 UNIT/ML IJ SOLN
INTRAMUSCULAR | Status: AC
  Filled 2019-12-11: qty 1

## 2019-12-11 MED ORDER — ASPIRIN 81 MG PO TBEC
81.0000 mg | DELAYED_RELEASE_TABLET | Freq: Every day | ORAL | 11 refills | Status: DC
Start: 2019-12-12 — End: 2022-10-20

## 2019-12-11 MED ORDER — HYDRALAZINE HCL 20 MG/ML IJ SOLN: 10.0000 mg | INTRAMUSCULAR | Status: DC | PRN

## 2019-12-11 MED ORDER — LIDOCAINE HCL (PF) 1 % IJ SOLN
INTRAMUSCULAR | Status: AC
  Filled 2019-12-11: qty 30

## 2019-12-11 MED ORDER — SODIUM CHLORIDE 0.9 % IV SOLN: 250.0000 mL | INTRAVENOUS | Status: DC | PRN

## 2019-12-11 MED ORDER — SODIUM CHLORIDE 0.9 % IV SOLN: INTRAVENOUS | Status: AC

## 2019-12-11 MED ORDER — NITROGLYCERIN 1 MG/10 ML FOR IR/CATH LAB
INTRA_ARTERIAL | Status: AC
  Filled 2019-12-11: qty 10

## 2019-12-11 MED ORDER — LIDOCAINE HCL (PF) 1 % IJ SOLN
INTRAMUSCULAR | Status: DC | PRN
  Administered 2019-12-11: 2 mL

## 2019-12-11 MED ORDER — VERAPAMIL HCL 2.5 MG/ML IV SOLN: INTRA_ARTERIAL | Status: DC | PRN

## 2019-12-11 MED ORDER — HEPARIN (PORCINE) IN NACL 1000-0.9 UT/500ML-% IV SOLN
INTRAVENOUS | Status: AC
  Filled 2019-12-11: qty 1000

## 2019-12-11 MED ORDER — IOHEXOL 350 MG/ML SOLN
INTRAVENOUS | Status: DC | PRN
  Administered 2019-12-11: 40 mL

## 2019-12-11 MED ORDER — ATORVASTATIN CALCIUM 80 MG PO TABS
80.0000 mg | ORAL_TABLET | Freq: Every day | ORAL | 0 refills | Status: DC
Start: 2019-12-11 — End: 2020-02-12

## 2019-12-11 MED ORDER — IPRATROPIUM BROMIDE 0.06 % NA SOLN
2.0000 | Freq: Two times a day (BID) | NASAL | Status: DC
  Filled 2019-12-11: qty 15

## 2019-12-11 MED ORDER — IPRATROPIUM BROMIDE 0.03 % NA SOLN
2.0000 | Freq: Two times a day (BID) | NASAL | Status: DC
  Filled 2019-12-11 (×2): qty 30

## 2019-12-11 MED ORDER — SODIUM CHLORIDE 0.9% FLUSH: 3.0000 mL | Freq: Two times a day (BID) | INTRAVENOUS | Status: DC

## 2019-12-11 MED ORDER — MORPHINE SULFATE (PF) 2 MG/ML IV SOLN: 2.0000 mg | INTRAVENOUS | Status: DC | PRN

## 2019-12-11 MED ORDER — ALBUTEROL SULFATE HFA 108 (90 BASE) MCG/ACT IN AERS
2.0000 | INHALATION_SPRAY | RESPIRATORY_TRACT | Status: DC | PRN
  Filled 2019-12-11: qty 6.7

## 2019-12-11 SURGICAL SUPPLY — 11 items
CATH INFINITI 5FR ANG PIGTAIL (CATHETERS) ×1 IMPLANT
CATH OPTITORQUE TIG 4.0 5F (CATHETERS) ×1 IMPLANT
DEVICE RAD COMP TR BAND LRG (VASCULAR PRODUCTS) ×1 IMPLANT
GLIDESHEATH SLEND A-KIT 6F 22G (SHEATH) ×1 IMPLANT
GUIDEWIRE INQWIRE 1.5J.035X260 (WIRE) IMPLANT
INQWIRE 1.5J .035X260CM (WIRE) ×2
KIT HEART LEFT (KITS) ×2 IMPLANT
PACK CARDIAC CATHETERIZATION (CUSTOM PROCEDURE TRAY) ×2 IMPLANT
TRANSDUCER W/STOPCOCK (MISCELLANEOUS) ×2 IMPLANT
TUBING CIL FLEX 10 FLL-RA (TUBING) ×2 IMPLANT
WIRE HI TORQ VERSACORE-J 145CM (WIRE) ×2 IMPLANT

## 2019-12-11 NOTE — Progress Notes (Signed)
Patient taken off the unit on a wheelchair along with her belongings, patient's husband to transport patient home.

## 2019-12-11 NOTE — Plan of Care (Signed)
DISCHARGE NOTE HOME Dana Rojas to be discharged home per MD order. Discussed prescriptions and follow up appointments with the patient. Prescriptions sent to pharmacy; medication list explained in detail. Patient verbalized understanding.  Skin clean, dry and intact without evidence of skin break down, no evidence of skin tears noted. IV catheter discontinued intact. Site without signs and symptoms of complications. Dressing and pressure applied. Pt denies pain at the site currently. No complaints noted.  Patient free of lines, drains, and wounds.   An After Visit Summary (AVS) was printed and given to the patient. Patient escorted via wheelchair, and discharged home via private auto.  Stephan Minister, RN

## 2019-12-11 NOTE — Progress Notes (Addendum)
The patient has been seen in conjunction with Vin Bhagat, PAC. All aspects of care have been considered and discussed. The patient has been personally interviewed, examined, and all clinical data has been reviewed.   Elevated troponin in the setting of A. fib with RVR and multiple risk factors for coronary vascular disease.  Elevated troponin could be related to demand ischemia but given risk factor profile, coronary angiography as needed.  Twelve-lead EKG today.  Further management/recommendations will depend upon cath results.  The patient was counseled to undergo left heart catheterization, coronary angiography, and possible percutaneous coronary intervention with stent implantation. The procedural risks and benefits were discussed in detail. The risks discussed included death, stroke, myocardial infarction, life-threatening bleeding, limb ischemia, kidney injury, allergy, and possible emergency cardiac surgery. The risk of these significant complications were estimated to occur less than 1% of the time. After discussion, the patient has agreed to proceed.  CHA2DS2-VASc Score = 2  The patient's score is based upon: CHF History: 0 HTN History: 1 Diabetes History: 0 Stroke History: 0 Vascular Disease History: 0 Age Score: 0 Gender Score: 1   Signed,  Sinclair Grooms, MD    12/11/2019 4:19 PM     Progress Note  Patient Name: Dana Rojas Date of Encounter: 12/11/2019  [facility] Cardiologist: Pixie Casino, MD   Subjective   Feeling well. No chest pain, sob or palpitations.   Inpatient Medications    Scheduled Meds: . aspirin EC  81 mg Oral Daily  . atorvastatin  80 mg Oral QHS  . metoprolol tartrate  12.5 mg Oral BID  . sodium chloride flush  3 mL Intravenous Q12H   Continuous Infusions: . heparin Stopped (12/11/19 1512)  . lactated ringers Stopped (12/10/19 2100)   PRN Meds: acetaminophen, ondansetron (ZOFRAN) IV, promethazine   Vital Signs      Vitals:   12/11/19 1548 12/11/19 1553 12/11/19 1558 12/11/19 1603  BP: (!) 174/93 (!) 148/94 (!) 165/94 (!) 157/92  Pulse: 66 71 (!) 102 (!) 56  Resp: 15 16 11  (!) 0  Temp:      TempSrc:      SpO2: 97% 99% 100% 99%  Weight:      Height:        Intake/Output Summary (Last 24 hours) at 12/11/2019 1619 Last data filed at 12/11/2019 0100 Gross per 24 hour  Intake 392.27 ml  Output --  Net 392.27 ml   Last 3 Weights 12/11/2019 12/09/2019 12/08/2019  Weight (lbs) 180 lb 11.2 oz 184 lb 8.4 oz 179 lb  Weight (kg) 81.965 kg 83.7 kg 81.194 kg      Telemetry    NSR - Personally Reviewed  ECG    N/A  Physical Exam   GEN: No acute distress.   Neck: No JVD Cardiac: RRR, no murmurs, rubs, or gallops.  Respiratory: Clear to auscultation bilaterally. GI: Soft, nontender, non-distended  MS: No edema; No deformity. Neuro:  Nonfocal  Psych: Normal affect   Labs    High Sensitivity Troponin:   Recent Labs  Lab 12/08/19 2148 12/09/19 0825 12/09/19 1255  TROPONINIHS 4 2,285* 2,676*      Chemistry Recent Labs  Lab 12/08/19 2148 12/09/19 0144 12/09/19 0825 12/10/19 0122 12/10/19 1522  NA 137   < > 141 140 140  K 2.7*   < > 3.7 3.3* 3.9  CL 102   < > 105 105 104  CO2 23   < > 24 26 26  GLUCOSE 232*   < > 153* 118* 163*  BUN 13   < > 8 6 6   CREATININE 0.59   < > 0.57 0.62 0.50  CALCIUM 8.8*   < > 8.2* 8.8* 9.3  PROT 7.0  --   --   --   --   ALBUMIN 4.0  --   --   --   --   AST 60*  --   --   --   --   ALT 84*  --   --   --   --   ALKPHOS 80  --   --   --   --   BILITOT 0.6  --   --   --   --   GFRNONAA >60   < > >60 >60 >60  ANIONGAP 12   < > 12 9 10    < > = values in this interval not displayed.     Hematology Recent Labs  Lab 12/09/19 0825 12/10/19 0122 12/11/19 0212  WBC 10.9* 9.9 6.7  RBC 4.73 4.53 4.51  HGB 12.2 11.7* 11.7*  HCT 39.2 36.7 36.4  MCV 82.9 81.0 80.7  MCH 25.8* 25.8* 25.9*  MCHC 31.1 31.9 32.1  RDW 14.0 14.0 13.9  PLT 287 290 258     DDimer  Recent Labs  Lab 12/09/19 0052  DDIMER 0.45     Radiology    CARDIAC CATHETERIZATION  Result Date: 12/11/2019 Dana Rojas is a 49 y.o. female  [id number] LOCATION:  FACILITY: [facility] PHYSICIAN: Quay Burow, M.D. 1971/02/19 DATE OF PROCEDURE:  12/11/2019 DATE OF DISCHARGE: CARDIAC CATHETERIZATION History obtained from chart review.  49 y.o. female with hx of HTN presented for dyspnea and tachycardia was found to be in A. fib with RVR. Also had intractable nausea, vomiting and abdominal pain.   Her cardiac enzymes elevated to approximately thousand.  Her EKG showed no acute changes when she converted to sinus rhythm.  Echo revealed normal LV function.  She was referred for diagnostic coronary angiography to rule out an ischemic etiology to her elevated enzymes.   Dana Rojas has normal coronary arteries and normal filling pressures.  I believe her elevated enzymes are related to her A. fib with RVR with demand ischemia.  Medical therapy will be recommended.  She will most likely required a DOAC for her A. fib but I will defer to Dr. Tamala Julian, her attending cardiologist and Dr. Debara Pickett.  Her sheath was removed and a TR band was placed on the right wrist to achieve patent hemostasis.  The patient left lab stable condition. Quay Burow. MD, [facility] 12/11/2019 4:10 PM    Cardiac Studies   Echo 12/09/2019 1. Left ventricular ejection fraction, by estimation, is 60 to 65%. The  left ventricle has normal function. The left ventricle has no regional  wall motion abnormalities. There is mild left ventricular hypertrophy.  Left ventricular diastolic parameters  were normal.  2. Right ventricular systolic function is normal. The right ventricular  size is normal. There is normal pulmonary artery systolic pressure. The  estimated right ventricular systolic pressure is 29.7 mmHg.  3. The mitral valve is normal in structure. Mild mitral valve  regurgitation. No evidence of mitral  stenosis.  4. The aortic valve is tricuspid. Aortic valve regurgitation is not  visualized. No aortic stenosis is present.  5. The inferior vena cava is normal in size with <50% respiratory  variability, suggesting right atrial pressure of 8 mmHg.   Patient Profile  48 y.o. female with hx of HTN presented for dyspnea and tachycardia was found to be in A. fib with RVR. Also had intractable nausea, vomiting and abdominal pain.   Assessment & Plan    1. New onset atrial fibrillation with RVR - Converted to sinus rhythm on cardizem drip. Currently maintaining sinus rhythm on low dose BB. CHADSVASCs score of 2 for HTN and female. On Heparin for anticoagulation. Will need DOAC at post cath but long term duration per Dr. Debara Pickett.  - short duration A. fib possibly related to electrolyte abnormalities and vomiting or ACS  - Echo with preserved LVEF  2. NSTEMI - Peak of troponin 2676. On heparin - for Cath later today.   3. Acute pulmonary edema - ? Related to afib or NSTEMI - Required BiPAP initially - Resolved   4. HLD - 12/09/2019: Cholesterol 285; HDL 34; LDL Cholesterol 207; Triglycerides 219; VLDL 44  - Continue statin   5. Nausea/Vomiting - Resolved  6. Hypokalemia - K was 2.7 on admit - Resolved  7. HTN - BP stable on low dose BB    For questions or updates, please contact [place] Please consult [email or web address] for contact info under        Signed, Sinclair Grooms, MD  12/11/2019, 4:19 PM

## 2019-12-11 NOTE — Plan of Care (Signed)

## 2019-12-11 NOTE — H&P (View-Only) (Signed)
The patient has been seen in conjunction with Vin Bhagat, PAC. All aspects of care have been considered and discussed. The patient has been personally interviewed, examined, and all clinical data has been reviewed.   Elevated troponin in the setting of A. fib with RVR and multiple risk factors for coronary vascular disease.  Elevated troponin could be related to demand ischemia but given risk factor profile, coronary angiography as needed.  Twelve-lead EKG today.  Further management/recommendations will depend upon cath results.  The patient was counseled to undergo left heart catheterization, coronary angiography, and possible percutaneous coronary intervention with stent implantation. The procedural risks and benefits were discussed in detail. The risks discussed included death, stroke, myocardial infarction, life-threatening bleeding, limb ischemia, kidney injury, allergy, and possible emergency cardiac surgery. The risk of these significant complications were estimated to occur less than 1% of the time. After discussion, the patient has agreed to proceed.   Progress Note  Patient Name: Dana Rojas Date of Encounter: 12/11/2019  [facility] Cardiologist: Pixie Casino, MD   Subjective   Feeling well. No chest pain, sob or palpitations.   Inpatient Medications    Scheduled Meds: . aspirin EC  81 mg Oral Daily  . atorvastatin  80 mg Oral QHS  . metoprolol tartrate  12.5 mg Oral BID  . sodium chloride flush  3 mL Intravenous Q12H   Continuous Infusions: . sodium chloride    . sodium chloride 1 mL/kg/hr (12/11/19 0821)  . diltiazem (CARDIZEM) infusion Stopped (12/09/19 0815)  . heparin 1,100 Units/hr (12/10/19 1152)  . lactated ringers Stopped (12/10/19 2100)   PRN Meds: sodium chloride, acetaminophen, ondansetron (ZOFRAN) IV, promethazine, sodium chloride flush   Vital Signs    Vitals:   12/11/19 0109 12/11/19 0548 12/11/19 0637 12/11/19 0836  BP: (!)  143/80  130/83 (!) 155/77  Pulse: 62  67 66  Resp: 18 17 18 15   Temp: 97.7 F (36.5 C)  98.3 F (36.8 C) 98.3 F (36.8 C)  TempSrc: Oral  Oral Oral  SpO2: 94%  95% 97%  Weight:  82 kg    Height:        Intake/Output Summary (Last 24 hours) at 12/11/2019 0920 Last data filed at 12/11/2019 0100 Gross per 24 hour  Intake 392.27 ml  Output --  Net 392.27 ml   Last 3 Weights 12/11/2019 12/09/2019 12/08/2019  Weight (lbs) 180 lb 11.2 oz 184 lb 8.4 oz 179 lb  Weight (kg) 81.965 kg 83.7 kg 81.194 kg      Telemetry    NSR - Personally Reviewed  ECG    N/A  Physical Exam   GEN: No acute distress.   Neck: No JVD Cardiac: RRR, no murmurs, rubs, or gallops.  Respiratory: Clear to auscultation bilaterally. GI: Soft, nontender, non-distended  MS: No edema; No deformity. Neuro:  Nonfocal  Psych: Normal affect   Labs    High Sensitivity Troponin:   Recent Labs  Lab 12/08/19 2148 12/09/19 0825 12/09/19 1255  TROPONINIHS 4 2,285* 2,676*      Chemistry Recent Labs  Lab 12/08/19 2148 12/09/19 0144 12/09/19 0825 12/10/19 0122 12/10/19 1522  NA 137   < > 141 140 140  K 2.7*   < > 3.7 3.3* 3.9  CL 102   < > 105 105 104  CO2 23   < > 24 26 26   GLUCOSE 232*   < > 153* 118* 163*  BUN 13   < > 8 6 6  CREATININE 0.59   < > 0.57 0.62 0.50  CALCIUM 8.8*   < > 8.2* 8.8* 9.3  PROT 7.0  --   --   --   --   ALBUMIN 4.0  --   --   --   --   AST 60*  --   --   --   --   ALT 84*  --   --   --   --   ALKPHOS 80  --   --   --   --   BILITOT 0.6  --   --   --   --   GFRNONAA >60   < > >60 >60 >60  ANIONGAP 12   < > 12 9 10    < > = values in this interval not displayed.     Hematology Recent Labs  Lab 12/09/19 0825 12/10/19 0122 12/11/19 0212  WBC 10.9* 9.9 6.7  RBC 4.73 4.53 4.51  HGB 12.2 11.7* 11.7*  HCT 39.2 36.7 36.4  MCV 82.9 81.0 80.7  MCH 25.8* 25.8* 25.9*  MCHC 31.1 31.9 32.1  RDW 14.0 14.0 13.9  PLT 287 290 258    DDimer  Recent Labs  Lab  12/09/19 0052  DDIMER 0.45     Radiology    ECHOCARDIOGRAM COMPLETE  Result Date: 12/09/2019    ECHOCARDIOGRAM REPORT   Patient Name:   Dana Rojas Date of Exam: 12/09/2019 Medical Rec #:  [id number]         Height:       62.0 in Accession #:    [id number]        Weight:       179.0 lb Date of Birth:  [birth year]         BSA:          1.824 m Patient Age:    49 years          BP:           126/78 mmHg Patient Gender: F                 HR:           79 bpm. Exam Location:  Inpatient Procedure: 2D Echo, Cardiac Doppler, Color Doppler and Intracardiac            Opacification Agent Indications:    Atrial fibrillation                 NSTEMI  History:        Patient has no prior history of Echocardiogram examinations.                 Risk Factors:Diabetes. Dyspnea.  Sonographer:    Clayton Lefort RDCS (AE) Referring Phys: [id number] Nadean Corwin HILTY  Sonographer Comments: Suboptimal apical window and suboptimal subcostal window. IMPRESSIONS  1. Left ventricular ejection fraction, by estimation, is 60 to 65%. The left ventricle has normal function. The left ventricle has no regional wall motion abnormalities. There is mild left ventricular hypertrophy. Left ventricular diastolic parameters were normal.  2. Right ventricular systolic function is normal. The right ventricular size is normal. There is normal pulmonary artery systolic pressure. The estimated right ventricular systolic pressure is 10.2 mmHg.  3. The mitral valve is normal in structure. Mild mitral valve regurgitation. No evidence of mitral stenosis.  4. The aortic valve is tricuspid. Aortic valve regurgitation is not visualized. No aortic stenosis is present.  5. The inferior vena cava is  normal in size with <50% respiratory variability, suggesting right atrial pressure of 8 mmHg. FINDINGS  Left Ventricle: Left ventricular ejection fraction, by estimation, is 60 to 65%. The left ventricle has normal function. The left ventricle has no regional wall motion  abnormalities. Definity contrast agent was given IV to delineate the left ventricular  endocardial borders. The left ventricular internal cavity size was normal in size. There is mild left ventricular hypertrophy. Left ventricular diastolic parameters were normal. Right Ventricle: The right ventricular size is normal. No increase in right ventricular wall thickness. Right ventricular systolic function is normal. There is normal pulmonary artery systolic pressure. The tricuspid regurgitant velocity is 2.50 m/s, and  with an assumed right atrial pressure of 8 mmHg, the estimated right ventricular systolic pressure is 79.8 mmHg. Left Atrium: Left atrial size was normal in size. Right Atrium: Right atrial size was normal in size. Pericardium: There is no evidence of pericardial effusion. Mitral Valve: The mitral valve is normal in structure. Mild mitral valve regurgitation. No evidence of mitral valve stenosis. Tricuspid Valve: The tricuspid valve is normal in structure. Tricuspid valve regurgitation is mild . No evidence of tricuspid stenosis. Aortic Valve: The aortic valve is tricuspid. Aortic valve regurgitation is not visualized. No aortic stenosis is present. Aortic valve mean gradient measures 3.0 mmHg. Aortic valve peak gradient measures 6.5 mmHg. Aortic valve area, by VTI measures 2.51 cm. Pulmonic Valve: The pulmonic valve was grossly normal. Pulmonic valve regurgitation is trivial. No evidence of pulmonic stenosis. Aorta: The aortic root is normal in size and structure. Venous: The inferior vena cava is normal in size with less than 50% respiratory variability, suggesting right atrial pressure of 8 mmHg. IAS/Shunts: The interatrial septum was not well visualized.  LEFT VENTRICLE PLAX 2D LVIDd:         3.40 cm  Diastology LVIDs:         2.20 cm  LV e' medial:    7.83 cm/s LV PW:         1.20 cm  LV E/e' medial:  11.1 LV IVS:        1.20 cm  LV e' lateral:   10.00 cm/s LVOT diam:     1.90 cm  LV E/e' lateral:  8.7 LV SV:         67 LV SV Index:   37 LVOT Area:     2.84 cm  RIGHT VENTRICLE             IVC RV Basal diam:  3.20 cm     IVC diam: 1.30 cm RV S prime:     12.20 cm/s TAPSE (M-mode): 1.7 cm LEFT ATRIUM             Index       RIGHT ATRIUM           Index LA diam:        3.30 cm 1.81 cm/m  RA Area:     15.20 cm LA Vol (A2C):   33.2 ml 18.21 ml/m RA Volume:   34.90 ml  19.14 ml/m LA Vol (A4C):   60.7 ml 33.28 ml/m LA Biplane Vol: 46.4 ml 25.44 ml/m  AORTIC VALVE AV Area (Vmax):    2.30 cm AV Area (Vmean):   2.51 cm AV Area (VTI):     2.51 cm AV Vmax:           127.00 cm/s AV Vmean:          87.300 cm/s AV  VTI:            0.267 m AV Peak Grad:      6.5 mmHg AV Mean Grad:      3.0 mmHg LVOT Vmax:         103.00 cm/s LVOT Vmean:        77.300 cm/s LVOT VTI:          0.236 m LVOT/AV VTI ratio: 0.88  AORTA Ao Root diam: 3.00 cm Ao Asc diam:  2.80 cm MITRAL VALVE               TRICUSPID VALVE MV Area (PHT): 4.80 cm    TR Peak grad:   25.0 mmHg MV Decel Time: 158 msec    TR Vmax:        250.00 cm/s MV E velocity: 87.00 cm/s MV A velocity: 63.40 cm/s  SHUNTS MV E/A ratio:  1.37        Systemic VTI:  0.24 m                            Systemic Diam: 1.90 cm Cherlynn Kaiser MD Electronically signed by Cherlynn Kaiser MD Signature Date/Time: 12/09/2019/2:42:27 PM    Final     Cardiac Studies   Echo 12/09/2019 1. Left ventricular ejection fraction, by estimation, is 60 to 65%. The  left ventricle has normal function. The left ventricle has no regional  wall motion abnormalities. There is mild left ventricular hypertrophy.  Left ventricular diastolic parameters  were normal.  2. Right ventricular systolic function is normal. The right ventricular  size is normal. There is normal pulmonary artery systolic pressure. The  estimated right ventricular systolic pressure is 48.1 mmHg.  3. The mitral valve is normal in structure. Mild mitral valve  regurgitation. No evidence of mitral stenosis.  4. The  aortic valve is tricuspid. Aortic valve regurgitation is not  visualized. No aortic stenosis is present.  5. The inferior vena cava is normal in size with <50% respiratory  variability, suggesting right atrial pressure of 8 mmHg.   Patient Profile     49 y.o. female with hx of HTN presented for dyspnea and tachycardia was found to be in A. fib with RVR. Also had intractable nausea, vomiting and abdominal pain.   Assessment & Plan    1. New onset atrial fibrillation with RVR - Converted to sinus rhythm on cardizem drip. Currently maintaining sinus rhythm on low dose BB. CHADSVASCs score of 2 for HTN and female. On Heparin for anticoagulation. Will need DOAC at post cath but long term duration per Dr. Debara Pickett.  - short duration A. fib possibly related to electrolyte abnormalities and vomiting or ACS  - Echo with preserved LVEF  2. NSTEMI - Peak of troponin 2676. On heparin - for Cath later today.   3. Acute pulmonary edema - ? Related to afib or NSTEMI - Required BiPAP initially - Resolved   4. HLD - 12/09/2019: Cholesterol 285; HDL 34; LDL Cholesterol 207; Triglycerides 219; VLDL 44  - Continue statin   5. Nausea/Vomiting - Resolved  6. Hypokalemia - K was 2.7 on admit - Resolved  7. HTN - BP stable on low dose BB    For questions or updates, please contact [place] Please consult [email or web address] for contact info under        SignedLeanor Kail, PA  12/11/2019, 9:20 AM

## 2019-12-11 NOTE — Interval H&P Note (Signed)
Cath Lab Visit (complete for each Cath Lab visit)  Clinical Evaluation Leading to the Procedure:   ACS: Yes.    Non-ACS:    Anginal Classification: CCS I  Anti-ischemic medical therapy: No Therapy  Non-Invasive Test Results: No non-invasive testing performed  Prior CABG: No previous CABG      History and Physical Interval Note:  12/11/2019 3:29 PM  Lissa R Wendt  has presented today for surgery, with the diagnosis of NSTEMI.  The various methods of treatment have been discussed with the patient and family. After consideration of risks, benefits and other options for treatment, the patient has consented to  Procedure(s): LEFT HEART CATH AND CORONARY ANGIOGRAPHY (N/A) as a surgical intervention.  The patient's history has been reviewed, patient examined, no change in status, stable for surgery.  I have reviewed the patient's chart and labs.  Questions were answered to the patient's satisfaction.     Quay Burow

## 2019-12-11 NOTE — Progress Notes (Signed)
   Recommend low dose metoprolol succinate 25 mg daily and Asa 81 mg saily.  No long term anticoagulation unless recurrence.  F/U with Dr. Debara Pickett  No further cardiac w/u. Consider sleep study if appropriate.

## 2019-12-11 NOTE — Progress Notes (Signed)
ANTICOAGULATION CONSULT NOTE - Follow Up Consult  Pharmacy Consult for Heparin Indication: NSTEMI  Allergies  Allergen Reactions  . Bee Venom     Body swelling    Patient Measurements: Height: 5\' 2"  (157.5 cm) Weight: 82 kg (180 lb 11.2 oz) IBW/kg (Calculated) : 50.1 Heparin Dosing Weight: 68.2 kg  Vital Signs: Temp: 98.3 F (36.8 C) (10/18 0836) Temp Source: Oral (10/18 0836) BP: 155/77 (10/18 0836) Pulse Rate: 66 (10/18 0836)  Labs: Recent Labs     0000 12/08/19 2148 12/09/19 0144 12/09/19 0825 12/09/19 1255 12/09/19 1737 12/10/19 0122 12/10/19 0122 12/10/19 0824 12/10/19 1522 12/10/19 1752 12/11/19 0212  HGB   < > 12.2   < > 12.2  --   --  11.7*  --   --   --   --  11.7*  HCT  --  38.8   < > 39.2  --   --  36.7  --   --   --   --  36.4  PLT  --  298   < > 287  --   --  290  --   --   --   --  258  HEPARINUNFRC  --   --   --   --   --    < > 0.23*   < > 0.30  --  0.31 0.35  CREATININE  --  0.59   < > 0.57  --   --  0.62  --   --  0.50  --   --   TROPONINIHS  --  4  --  2,285* 2,676*  --   --   --   --   --   --   --    < > = values in this interval not displayed.    Estimated Creatinine Clearance: 84.5 mL/min (by C-G formula based on SCr of 0.5 mg/dL).   Medications:  Scheduled:  . aspirin EC  81 mg Oral Daily  . atorvastatin  80 mg Oral QHS  . metoprolol tartrate  12.5 mg Oral BID  . sodium chloride flush  3 mL Intravenous Q12H   Infusions:  . sodium chloride    . sodium chloride 1 mL/kg/hr (12/11/19 0821)  . heparin 1,100 Units/hr (12/10/19 1152)  . lactated ringers Stopped (12/10/19 2100)    Assessment: 49 yo F initially presented to the ED with N/V.  Now with elevated trop and suspected NSTEMI.  Pt was started on heparin 10/16 PM.  Plans for cath today and MD consider oral anticoagulation for afib -heparin level at goal  Goal of Therapy:  Heparin level 0.3-0.7 units/ml Monitor platelets by anticoagulation protocol: Yes   Plan:  Continue  heparin drip rate 1100 units/hr Daily heparin level and CBC  Hildred Laser, PharmD Clinical Pharmacist **Pharmacist phone directory can now be found on [email or web address] ([email or web address]).  Listed under [place].

## 2019-12-11 NOTE — Progress Notes (Signed)
PROGRESS NOTE  Dana Rojas:[id number] DOB: 1970-12-01 DOA: 12/08/2019 PCP: Dana Fess, MD  Brief History   The patient is a 49 yr old woman who presented to [facility] on 12/08/2019 with complaints of nausea, vomiting, and abdominal pain. Her illness began at 4pm when she had received her flu shot. By 7 pm she had nausea and vomiting with generalized weakness. There was also abdominal cramping and diarrhea. She thought that it may have had something to do with [place] food that she ate around noon. EMS was called and they found the patient to be bradycardic in the 40's.   In the ED the patient was given IV fluids. She then developed tachycardia with HR in th 140-160 range. EKG demonstrated atrial fibrillation. He was also found to be hypokalemic at 2.7.   The patient has a past medical history significant for vitamin D Deficiency.  [facility] were consulted to admit the patient for further evaluation and treatment.   The patient is resting comfortably. She is awake, alert, and oriented x 3. No acute distress. Heart and lung exams were within normal limits. Abdomen is soft, non-tender, non-distended. Extremities are  Without cyanosis, clubbing or edema.   Cardiology has been consulted and they ordered an echocardiogram. It has demonstrated an EF of 60-65%. LVED if is normal. No regional wall motion abnormalities. There is mild LVH. Left ventricular diastolic parameters were normal. Due to her very high troponins there is substantial suspicion of ACS. Plan is for [facility] later today.  Consultants  . Cardiology  Procedures  . None  Antibiotics   Anti-infectives (From admission, onward)   None     Subjective  The patient is resting comfortably. She denies any further GI symptoms. No new complaints.  Objective   Vitals:  Vitals:   12/11/19 0836 12/11/19 1138  BP: (!) 155/77 (!) 151/98  Pulse: 66 61  Resp: 15 15  Temp: 98.3 F (36.8 C) 97.9 F (36.6 C)  SpO2:  97% 96%   Exam:  Constitutional:  . The patient is awake, alert, and oriented x 3. No acute distress. Respiratory:  . No increased work of breathing. . No wheezes, rales, or rhonchi . No tactile fremitus Cardiovascular:  . Regular rate and rhythm . No murmurs, ectopy, or gallups. . No lateral PMI. No thrills. Abdomen:  . Abdomen is soft, non-tender, non-distended . No hernias, masses, or organomegaly . Normoactive bowel sounds.  Musculoskeletal:  . No cyanosis, clubbing, or edema Skin:  . No rashes, lesions, ulcers . palpation of skin: no induration or nodules Neurologic:  . CN 2-12 intact . Sensation all 4 extremities intact Psychiatric:  . Mental status o Mood, affect appropriate o Orientation to person, place, time  . judgment and insight appear intact  I have personally reviewed the following:   Today's Data  . Vitals, CBC  Imaging  . CXR: worsening interstitial edema  Cardiology Data  . EKG . Echocardiogram  Scheduled Meds: . aspirin EC  81 mg Oral Daily  . atorvastatin  80 mg Oral QHS  . metoprolol tartrate  12.5 mg Oral BID  . sodium chloride flush  3 mL Intravenous Q12H   Continuous Infusions: . sodium chloride    . sodium chloride 1 mL/kg/hr (12/11/19 0821)  . heparin 1,100 Units/hr (12/11/19 1314)  . lactated ringers Stopped (12/10/19 2100)    Principal Problem:   New onset a-fib ([facility]) Active Problems:   Hyperlipidemia   Non-intractable vomiting   Hypokalemia  Gastroenteritis   Hyperglycemia   Atrial fibrillation with RVR (HCC)   NSTEMI (non-ST elevated myocardial infarction) ([facility])   LOS: 2 days   A & P  New onset a-fib [facility]): Now converted to sinus rhythm. CHADSVASC 2 score is 2. Atrial fibrillation was of short duration. Ms. Dana Rojas is admitted to cardiac telemetry unit.  She has converted to sinus on metoprolol. Electrolytes are being monitored with goal magnesium greater than 2 and potassium greater than 4 per cardiology  recommendation. She will need to start NOAC per cardiology after LHC.  NSTEMI/ACS: Troponins were very elevated to 2285 and then 2676. Cardiology feels that this is out of scale to level of demand ischemia that can be explained by atrial fibrillation alone. Echocardiogram has been perfomormed.  It has demonstrated an EF of 60-65%. LVED if is normal. No regional wall motion abnormalities. There is mild LVH. Left ventricular diastolic parameters were normal. Due to her very high troponins there is substantial suspicion of ACS. Plan is for [facility] later today. .  Hypokalemia: Initial potassium was 2.7.  3.9 yesterday afternoon. . Supplement and monitor. Maintain K of greater than 4 and magnesium greater than 2 as per cardiology recommendation. Monitor.  Non-intractable vomiting: Resolved. Phenergan provided as needed for nausea and vomiting.  Secondary to probable gastroenteritis.  Gastroenteritis: Resolved. Diet has been advanced to heart healthy. She will be NPO after midnight.  Hyperglycemia: Blood sugar elevated when she arrived in the emergency room.  We will recheck blood sugar in the morning.    I have seen and examined this patient myself. I have spent 32 minutes in her evaluation and care.  DVT prophylaxis:      Lovenox for DVT prophylaxis. Code Status:              Full code Family Communication:       None available Disposition Plan:              Patient is from:                        Home             Anticipated DC to:                   Home             Anticipated DC date:               Anticipate greater than 2 midnight stay in the hospital to treat acute medical condition             Anticipated DC barriers:         No barriers to discharge identified at this time   [place], DO [facility] Direct contact: see [email or web address]  7PM-7AM contact night coverage as above 12/11/2019, 2:30 PM  LOS: 1 day

## 2019-12-12 MED FILL — Heparin Sodium (Porcine) Inj 1000 Unit/ML: INTRAMUSCULAR | Qty: 10 | Status: AC

## 2019-12-12 NOTE — Discharge Summary (Signed)
Physician Discharge Summary  Dana Rojas:[id number] DOB: 06-25-1970 DOA: 12/08/2019  PCP: Hulan Fess, MD  Admit date: 12/08/2019 Discharge date: 12/12/2019  Recommendations for Outpatient Follow-up:  1. Discharge to home. 2. Follow up with PCP in 7-10 days. 3. Follow up with Dr. Debara Pickett as directed. 4. Sleep study as outpatient.  Discharge Diagnoses: Principal diagnosis is #1 1. New onset atrial fibrillation 2. Hypokalemia 3. Elevated troponins 4. NSTEMI 5. Respiratory acidosis 6. Pulmonary edema 7. Hypoxia  Discharge Condition: Fair  Disposition: Home  Diet recommendation: Heart healthy  Filed Weights   12/08/19 2143 12/09/19 1515 12/11/19 0548  Weight: 81.2 kg 83.7 kg 82 kg    History of present illness: Dana Rojas is a 49 y.o. female with medical history significant for medical history of vitamin D deficiency who presents the ED for abdominal pain, nausea and vomiting and not feeling well. Husband is at bedside and helps provide history. He states that she received her flu shot this afternoon at 4 PM and started to feel sick around 7 PM with nausea and vomiting and generalized weakness. She reports mild abdominal cramping and diarrhea.  Poorly she ate [place] food that her friend made earlier in the day.  She is normally does not eat spicy foods according to her husband.  She reports that after she got her flu shot her arm hurt more than it normally does when she gets a flu shot.  She has not had any fever.  She went to a football game with her children and while there she started to feel bad which she described as a weakness and just feeling warm and then she became nauseous and started vomiting.She denies chest pain, palpitation, skipped beats or extra beats when she was at the football game.  911 was called.EMS also noticed some pauses with bradycardia, heart rates in the 40s. Patient denies any heart issue history.  It is noted that her mother had a  heart condition and needed a pacemaker in her mid 60s.  Her father also has heart disease and required a pacemaker.  ED Course: In the emergency room patient was noted initially to be bradycardic she was given some IV fluids and then she developed tachycardia with heart rate in the 140-160 range and EKG was repeated and showed atrial fibrillation.  Potassium was noted to be 2.7 and she was given 1 dose of oral potassium supplementation and IV potassium supplementation.  Cardiology was consulted and has seen patient in the emergency room.  Patient case was discussed with cardiology at patient bedside.  Hospital Course: The patient is a 49 yr old woman who presented to [facility] ED on 12/08/2019 with complaints of nausea, vomiting, and abdominal pain. Her illness began at 4pm when she had received her flu shot. By 7 pm she had nausea and vomiting with generalized weakness. There was also abdominal cramping and diarrhea. She thought that it may have had something to do with [place] food that she ate around noon. EMS was called and they found the patient to be bradycardic in the 40's.   In the ED the patient was given IV fluids. She then developed tachycardia with HR in th 140-160 range. EKG demonstrated atrial fibrillation. He was also found to be hypokalemic at 2.7.   The patient has a past medical history significant for vitamin D Deficiency.  [facility] were consulted to admit the patient for further evaluation and treatment.   The patient is resting comfortably. She is  awake, alert, and oriented x 3. No acute distress. Heart and lung exams were within normal limits. Abdomen is soft, non-tender, non-distended. Extremities are Without cyanosis, clubbing or edema.   Cardiology has been consulted and they ordered an echocardiogram. It has demonstrated an EF of 60-65%. LVED if is normal. No regional wall motion abnormalities. There is mild LVH. Left ventricular diastolic parameters were normal.  Due to her very high troponins there is substantial suspicion of ACS. Plan is for [facility] later today.  Today's assessment: S: The patient is resting comfortably. No new complaints. O: Vitals:  Vitals:   12/11/19 1645 12/11/19 1706  BP: (!) 155/85 (!) 165/93  Pulse: 65 63  Resp: 20 14  Temp:    SpO2: 96% 99%  Exam:  Constitutional:   The patient is awake, alert, and oriented x 3. No acute distress. Respiratory:   No increased work of breathing.  No wheezes, rales, or rhonchi  No tactile fremitus Cardiovascular:   Regular rate and rhythm  No murmurs, ectopy, or gallups.  No lateral PMI. No thrills. Abdomen:   Abdomen is soft, non-tender, non-distended  No hernias, masses, or organomegaly  Normoactive bowel sounds.  Musculoskeletal:   No cyanosis, clubbing, or edema Skin:   No rashes, lesions, ulcers  palpation of skin: no induration or nodules Neurologic:   CN 2-12 intact  Sensation all 4 extremities intact Psychiatric:   Mental status ? Mood, affect appropriate ? Orientation to person, place, time  judgment and insight appear intact  Discharge Instructions  Discharge Instructions    Activity as tolerated - No restrictions   Complete by: As directed    Call MD for:  difficulty breathing, headache or visual disturbances   Complete by: As directed    Call MD for:  extreme fatigue   Complete by: As directed    Diet - low sodium heart healthy   Complete by: As directed    Discharge instructions   Complete by: As directed    Discharge to home. Follow up with PCP in 7-10 days. Follow up with Dr. Debara Pickett as directed. Sleep study as outpatient.   Increase activity slowly   Complete by: As directed      Allergies as of 12/11/2019      Reactions   Bee Venom    Body swelling      Medication List    STOP taking these medications   BENADRYL PO   medroxyPROGESTERone 10 MG tablet Commonly known as: PROVERA     TAKE these medications     albuterol 108 (90 Base) MCG/ACT inhaler Commonly known as: VENTOLIN HFA Inhale 2 puffs into the lungs every 4 (four) hours as needed for wheezing or shortness of breath (cough, shortness of breath or wheezing.).   aspirin 81 MG EC tablet Take 1 tablet (81 mg total) by mouth daily. Swallow whole.   atorvastatin 80 MG tablet Commonly known as: LIPITOR Take 1 tablet (80 mg total) by mouth at bedtime.   cetirizine 10 MG tablet Commonly known as: ZYRTEC Take 10 mg by mouth daily.   EPINEPHrine 0.3 mg/0.3 mL Soaj injection Commonly known as: EPI-PEN Reason minister if you have any respiratory distress following an insect sting and call 911 What changed:   how much to take  how to take this  when to take this  reasons to take this   fluticasone 50 MCG/ACT nasal spray Commonly known as: FLONASE Place 2 sprays into both nostrils daily. What changed:   when  to take this  reasons to take this   ipratropium 0.03 % nasal spray Commonly known as: ATROVENT Place 2 sprays into the nose 2 (two) times daily.   metoprolol succinate 25 MG 24 hr tablet Commonly known as: Toprol XL Take 1 tablet (25 mg total) by mouth daily.      Allergies  Allergen Reactions  . Bee Venom     Body swelling    The results of significant diagnostics from this hospitalization (including imaging, microbiology, ancillary and laboratory) are listed below for reference.    Significant Diagnostic Studies: CARDIAC CATHETERIZATION  Result Date: 12/11/2019 ENEDELIA MARTORELLI is a 49 y.o. female  [id number] LOCATION:  FACILITY: [facility] PHYSICIAN: Quay Burow, M.D. [birth year] DATE OF PROCEDURE:  12/11/2019 DATE OF DISCHARGE: CARDIAC CATHETERIZATION History obtained from chart review.  49 y.o. female with hx of HTN presented for dyspnea and tachycardia was found to be in A. fib with RVR. Also had intractable nausea, vomiting and abdominal pain.   Her cardiac enzymes elevated to approximately thousand.  Her EKG  showed no acute changes when she converted to sinus rhythm.  Echo revealed normal LV function.  She was referred for diagnostic coronary angiography to rule out an ischemic etiology to her elevated enzymes.   Ms. Daidone has normal coronary arteries and normal filling pressures.  I believe her elevated enzymes are related to her A. fib with RVR with demand ischemia.  Medical therapy will be recommended.  She will most likely required a DOAC for her A. fib but I will defer to Dr. Tamala Julian, her attending cardiologist and Dr. Debara Pickett.  Her sheath was removed and a TR band was placed on the right wrist to achieve patent hemostasis.  The patient left lab stable condition. Quay Burow. MD, [facility] 12/11/2019 4:10 PM   DG Chest Port 1 View  Result Date: 12/09/2019 CLINICAL DATA:  49 year old female with shortness of breath. EXAM: PORTABLE CHEST 1 VIEW COMPARISON:  Chest CT dated 08/17/2009 and radiograph dated 12/08/2019. FINDINGS: Interval progression of interstitial densities compared to the earlier radiograph likely representing worsening edema. Pneumonia is not excluded clinical correlation is recommended. Small bilateral pleural effusions suspected. No pneumothorax. Stable cardiac silhouette. No acute osseous pathology. IMPRESSION: Findings likely represent worsening edema. Pneumonia is not excluded. Clinical correlation is recommended. Electronically Signed   By: Anner Crete M.D.   On: 12/09/2019 02:18   DG Chest Port 1 View  Result Date: 12/08/2019 CLINICAL DATA:  49 year old female with vomiting. EXAM: PORTABLE CHEST 1 VIEW COMPARISON:  Chest radiograph dated 06/14/2019. FINDINGS: There is mild interstitial and peribronchial densities which may represent mild congestion, reactive airway disease, or viral infection. Clinical correlation is recommended. No focal consolidation, pleural effusion, or pneumothorax. The cardiac silhouette is within limits. No acute osseous pathology. IMPRESSION: No focal  consolidation. Interstitial and peribronchial densities as above. Electronically Signed   By: Anner Crete M.D.   On: 12/08/2019 22:54   ECHOCARDIOGRAM COMPLETE  Result Date: 12/09/2019    ECHOCARDIOGRAM REPORT   Patient Name:   KALYA TROEGER Date of Exam: 12/09/2019 Medical Rec #:  [id number]         Height:       62.0 in Accession #:    [id number]        Weight:       179.0 lb Date of Birth:  19-Jun-1970         BSA:          1.824 m Patient Age:  49 years          BP:           126/78 mmHg Patient Gender: F                 HR:           79 bpm. Exam Location:  Inpatient Procedure: 2D Echo, Cardiac Doppler, Color Doppler and Intracardiac            Opacification Agent Indications:    Atrial fibrillation                 NSTEMI  History:        Patient has no prior history of Echocardiogram examinations.                 Risk Factors:Diabetes. Dyspnea.  Sonographer:    Clayton Lefort RDCS (AE) Referring Phys: [id number] Nadean Corwin HILTY  Sonographer Comments: Suboptimal apical window and suboptimal subcostal window. IMPRESSIONS  1. Left ventricular ejection fraction, by estimation, is 60 to 65%. The left ventricle has normal function. The left ventricle has no regional wall motion abnormalities. There is mild left ventricular hypertrophy. Left ventricular diastolic parameters were normal.  2. Right ventricular systolic function is normal. The right ventricular size is normal. There is normal pulmonary artery systolic pressure. The estimated right ventricular systolic pressure is 49.6 mmHg.  3. The mitral valve is normal in structure. Mild mitral valve regurgitation. No evidence of mitral stenosis.  4. The aortic valve is tricuspid. Aortic valve regurgitation is not visualized. No aortic stenosis is present.  5. The inferior vena cava is normal in size with <50% respiratory variability, suggesting right atrial pressure of 8 mmHg. FINDINGS  Left Ventricle: Left ventricular ejection fraction, by estimation, is 60 to  65%. The left ventricle has normal function. The left ventricle has no regional wall motion abnormalities. Definity contrast agent was given IV to delineate the left ventricular  endocardial borders. The left ventricular internal cavity size was normal in size. There is mild left ventricular hypertrophy. Left ventricular diastolic parameters were normal. Right Ventricle: The right ventricular size is normal. No increase in right ventricular wall thickness. Right ventricular systolic function is normal. There is normal pulmonary artery systolic pressure. The tricuspid regurgitant velocity is 2.50 m/s, and  with an assumed right atrial pressure of 8 mmHg, the estimated right ventricular systolic pressure is 75.9 mmHg. Left Atrium: Left atrial size was normal in size. Right Atrium: Right atrial size was normal in size. Pericardium: There is no evidence of pericardial effusion. Mitral Valve: The mitral valve is normal in structure. Mild mitral valve regurgitation. No evidence of mitral valve stenosis. Tricuspid Valve: The tricuspid valve is normal in structure. Tricuspid valve regurgitation is mild . No evidence of tricuspid stenosis. Aortic Valve: The aortic valve is tricuspid. Aortic valve regurgitation is not visualized. No aortic stenosis is present. Aortic valve mean gradient measures 3.0 mmHg. Aortic valve peak gradient measures 6.5 mmHg. Aortic valve area, by VTI measures 2.51 cm. Pulmonic Valve: The pulmonic valve was grossly normal. Pulmonic valve regurgitation is trivial. No evidence of pulmonic stenosis. Aorta: The aortic root is normal in size and structure. Venous: The inferior vena cava is normal in size with less than 50% respiratory variability, suggesting right atrial pressure of 8 mmHg. IAS/Shunts: The interatrial septum was not well visualized.  LEFT VENTRICLE PLAX 2D LVIDd:         3.40 cm  Diastology LVIDs:  2.20 cm  LV e' medial:    7.83 cm/s LV PW:         1.20 cm  LV E/e' medial:  11.1 LV  IVS:        1.20 cm  LV e' lateral:   10.00 cm/s LVOT diam:     1.90 cm  LV E/e' lateral: 8.7 LV SV:         67 LV SV Index:   37 LVOT Area:     2.84 cm  RIGHT VENTRICLE             IVC RV Basal diam:  3.20 cm     IVC diam: 1.30 cm RV S prime:     12.20 cm/s TAPSE (M-mode): 1.7 cm LEFT ATRIUM             Index       RIGHT ATRIUM           Index LA diam:        3.30 cm 1.81 cm/m  RA Area:     15.20 cm LA Vol (A2C):   33.2 ml 18.21 ml/m RA Volume:   34.90 ml  19.14 ml/m LA Vol (A4C):   60.7 ml 33.28 ml/m LA Biplane Vol: 46.4 ml 25.44 ml/m  AORTIC VALVE AV Area (Vmax):    2.30 cm AV Area (Vmean):   2.51 cm AV Area (VTI):     2.51 cm AV Vmax:           127.00 cm/s AV Vmean:          87.300 cm/s AV VTI:            0.267 m AV Peak Grad:      6.5 mmHg AV Mean Grad:      3.0 mmHg LVOT Vmax:         103.00 cm/s LVOT Vmean:        77.300 cm/s LVOT VTI:          0.236 m LVOT/AV VTI ratio: 0.88  AORTA Ao Root diam: 3.00 cm Ao Asc diam:  2.80 cm MITRAL VALVE               TRICUSPID VALVE MV Area (PHT): 4.80 cm    TR Peak grad:   25.0 mmHg MV Decel Time: 158 msec    TR Vmax:        250.00 cm/s MV E velocity: 87.00 cm/s MV A velocity: 63.40 cm/s  SHUNTS MV E/A ratio:  1.37        Systemic VTI:  0.24 m                            Systemic Diam: 1.90 cm Cherlynn Kaiser MD Electronically signed by Cherlynn Kaiser MD Signature Date/Time: 12/09/2019/2:42:27 PM    Final     Microbiology: Recent Results (from the past 240 hour(s))  Respiratory Panel by RT PCR (Flu A&B, Covid) - Nasopharyngeal Swab     Status: None   Collection Time: 12/08/19 11:36 PM   Specimen: Nasopharyngeal Swab  Result Value Ref Range Status   SARS Coronavirus 2 by RT PCR NEGATIVE NEGATIVE Final    Comment: (NOTE) SARS-CoV-2 target nucleic acids are NOT DETECTED.  The SARS-CoV-2 RNA is generally detectable in upper respiratoy specimens during the acute phase of infection. The lowest concentration of SARS-CoV-2 viral copies this assay can  detect is 131 copies/mL. A negative result does not preclude  SARS-Cov-2 infection and should not be used as the sole basis for treatment or other patient management decisions. A negative result may occur with  improper specimen collection/handling, submission of specimen other than nasopharyngeal swab, presence of viral mutation(s) within the areas targeted by this assay, and inadequate number of viral copies (<131 copies/mL). A negative result must be combined with clinical observations, patient history, and epidemiological information. The expected result is Negative.  Fact Sheet for Patients:  PinkCheek.be  Fact Sheet for Healthcare Providers:  GravelBags.it  This test is no t yet approved or cleared by the [place] FDA and  has been authorized for detection and/or diagnosis of SARS-CoV-2 by FDA under an Emergency Use Authorization (EUA). This EUA will remain  in effect (meaning this test can be used) for the duration of the COVID-19 declaration under Section 564(b)(1) of the Act, 21 U.S.C. section 360bbb-3(b)(1), unless the authorization is terminated or revoked sooner.     Influenza A by PCR NEGATIVE NEGATIVE Final   Influenza B by PCR NEGATIVE NEGATIVE Final    Comment: (NOTE) The Xpert Xpress SARS-CoV-2/FLU/RSV assay is intended as an aid in  the diagnosis of influenza from Nasopharyngeal swab specimens and  should not be used as a sole basis for treatment. Nasal washings and  aspirates are unacceptable for Xpert Xpress SARS-CoV-2/FLU/RSV  testing.  Fact Sheet for Patients: PinkCheek.be  Fact Sheet for Healthcare Providers: GravelBags.it  This test is not yet approved or cleared by the [place] FDA and  has been authorized for detection and/or diagnosis of SARS-CoV-2 by  FDA under an Emergency Use Authorization (EUA). This EUA will remain  in  effect (meaning this test can be used) for the duration of the  Covid-19 declaration under Section 564(b)(1) of the Act, 21  U.S.C. section 360bbb-3(b)(1), unless the authorization is  terminated or revoked. Performed at [facility], [place] [address], [place], [place] [address]      Labs: Basic Metabolic Panel: Recent Labs  Lab 12/08/19 2148 12/08/19 2148 12/09/19 0144 12/09/19 0410 12/09/19 0825 12/10/19 0122 12/10/19 1522  NA 137   < > 140 141 141 140 140  K 2.7*   < > 3.7 3.7 3.7 3.3* 3.9  CL 102  --   --   --  105 105 104  CO2 23  --   --   --  24 26 26   GLUCOSE 232*  --   --   --  153* 118* 163*  BUN 13  --   --   --  8 6 6   CREATININE 0.59  --   --   --  0.57 0.62 0.50  CALCIUM 8.8*  --   --   --  8.2* 8.8* 9.3  MG 1.7  --   --   --   --   --  1.8   < > = values in this interval not displayed.   Liver Function Tests: Recent Labs  Lab 12/08/19 2148  AST 60*  ALT 84*  ALKPHOS 80  BILITOT 0.6  PROT 7.0  ALBUMIN 4.0   Recent Labs  Lab 12/08/19 2148  LIPASE 31   No results for input(s): AMMONIA in the last 168 hours. CBC: Recent Labs  Lab 12/08/19 2148 12/08/19 2148 12/09/19 0144 12/09/19 0410 12/09/19 0825 12/10/19 0122 12/11/19 0212  WBC 11.0*  --   --   --  10.9* 9.9 6.7  HGB 12.2   < > 12.2 13.3 12.2 11.7* 11.7*  HCT 38.8   < > 36.0 39.0 39.2 36.7 36.4  MCV 81.5  --   --   --  82.9 81.0 80.7  PLT 298  --   --   --  287 290 258   < > = values in this interval not displayed.   Cardiac Enzymes: No results for input(s): CKTOTAL, CKMB, CKMBINDEX, TROPONINI in the last 168 hours. BNP: BNP (last 3 results) No results for input(s): BNP in the last 8760 hours.  ProBNP (last 3 results) No results for input(s): PROBNP in the last 8760 hours.  CBG: No results for input(s): GLUCAP in the last 168 hours.  Principal Problem:   New onset a-fib ([place]) Active Problems:   Hyperlipidemia   Non-intractable vomiting   Hypokalemia    Gastroenteritis   Hyperglycemia   Atrial fibrillation with RVR (H[facility])   NSTEMI (non-ST elevated myocardial infarction) ([place])   Time coordinating discharge: 38 minutes.  Signed:        Tamyka Bezio, DO [facility]  12/12/2019, 6:17 PM

## 2019-12-13 ENCOUNTER — Emergency Department (HOSPITAL_BASED_OUTPATIENT_CLINIC_OR_DEPARTMENT_OTHER)
Admission: EM | Admit: 2019-12-13 | Discharge: 2019-12-13 | Disposition: A | Discharge status: Home / Self Care | Payer: BC Managed Care – PPO | Attending: Emergency Medicine | Admitting: Emergency Medicine

## 2019-12-13 ENCOUNTER — Emergency Department (HOSPITAL_BASED_OUTPATIENT_CLINIC_OR_DEPARTMENT_OTHER): Payer: BC Managed Care – PPO

## 2019-12-13 ENCOUNTER — Encounter (HOSPITAL_BASED_OUTPATIENT_CLINIC_OR_DEPARTMENT_OTHER): Payer: Self-pay | Admitting: *Deleted

## 2019-12-13 ENCOUNTER — Other Ambulatory Visit: Payer: Self-pay

## 2019-12-13 DIAGNOSIS — R42 Dizziness and giddiness: Secondary | ICD-10-CM | POA: Insufficient documentation

## 2019-12-13 DIAGNOSIS — K760 Fatty (change of) liver, not elsewhere classified: Secondary | ICD-10-CM | POA: Diagnosis not present

## 2019-12-13 DIAGNOSIS — R61 Generalized hyperhidrosis: Secondary | ICD-10-CM | POA: Diagnosis not present

## 2019-12-13 DIAGNOSIS — I4891 Unspecified atrial fibrillation: Secondary | ICD-10-CM | POA: Insufficient documentation

## 2019-12-13 DIAGNOSIS — R197 Diarrhea, unspecified: Secondary | ICD-10-CM | POA: Insufficient documentation

## 2019-12-13 LAB — CBC
HCT: 41.7 % (ref 36.0–46.0)
Hemoglobin: 13.8 g/dL (ref 12.0–15.0)
MCH: 26.5 pg (ref 26.0–34.0)
MCHC: 33.1 g/dL (ref 30.0–36.0)
MCV: 80.2 fL (ref 80.0–100.0)
Platelets: 317 10*3/uL (ref 150–400)
RBC: 5.2 MIL/uL — ABNORMAL HIGH (ref 3.87–5.11)
RDW: 13.8 % (ref 11.5–15.5)
WBC: 9.5 10*3/uL (ref 4.0–10.5)
nRBC: 0 % (ref 0.0–0.2)

## 2019-12-13 LAB — COMPREHENSIVE METABOLIC PANEL
ALT: 148 U/L — ABNORMAL HIGH (ref 0–44)
AST: 117 U/L — ABNORMAL HIGH (ref 15–41)
Albumin: 4.2 g/dL (ref 3.5–5.0)
Alkaline Phosphatase: 95 U/L (ref 38–126)
Anion gap: 13 (ref 5–15)
BUN: 13 mg/dL (ref 6–20)
CO2: 25 mmol/L (ref 22–32)
Calcium: 9.3 mg/dL (ref 8.9–10.3)
Chloride: 100 mmol/L (ref 98–111)
Creatinine, Ser: 0.65 mg/dL (ref 0.44–1.00)
GFR, Estimated: >60 mL/min (ref >60)
Glucose, Bld: 195 mg/dL — ABNORMAL HIGH (ref 70–99)
Potassium: 4.2 mmol/L (ref 3.5–5.1)
Sodium: 138 mmol/L (ref 135–145)
Total Bilirubin: 0.4 mg/dL (ref 0.3–1.2)
Total Protein: 7.7 g/dL (ref 6.5–8.1)

## 2019-12-13 LAB — D-DIMER, QUANTITATIVE: D-Dimer, Quant: 0.66 ug/mL-FEU — ABNORMAL HIGH (ref 0.00–0.50)

## 2019-12-13 LAB — PROTIME-INR
INR: 1.2 (ref 0.8–1.2)
Prothrombin Time: 14.4 seconds (ref 11.4–15.2)

## 2019-12-13 MED ORDER — IOHEXOL 350 MG/ML SOLN
100.0000 mL | Freq: Once | INTRAVENOUS | Status: AC | PRN
  Administered 2019-12-13: 100 mL via INTRAVENOUS

## 2019-12-13 MED ORDER — SODIUM CHLORIDE 0.9 % IV BOLUS
1000.0000 mL | Freq: Once | INTRAVENOUS | Status: AC
  Administered 2019-12-13: 1000 mL via INTRAVENOUS

## 2019-12-13 MED ORDER — ACETAMINOPHEN 500 MG PO TABS
1000.0000 mg | ORAL_TABLET | Freq: Once | ORAL | Status: AC
  Administered 2019-12-13: 1000 mg via ORAL
  Filled 2019-12-13: qty 2

## 2019-12-13 MED ORDER — ONDANSETRON 4 MG PO TBDP: 4.0000 mg | ORAL_TABLET | Freq: Three times a day (TID) | ORAL | 0 refills | Status: DC | PRN

## 2019-12-13 NOTE — ED Provider Notes (Signed)
[facility] Emergency Department Provider Note MRN:  [id number]  Arrival date & time: 12/13/19     Chief Complaint   Dizziness   History of Present Illness   Dana Rojas is a 49 y.o. year-old female with a history of A. fib presenting to the ED with chief complaint of dizziness.  Patient explains that 5 days ago she experienced sudden onset lightheadedness, diffuse diaphoresis, diarrhea, was admitted to the hospital and underwent various testing and was discharged without a formal diagnosis.  She is generally felt better until today when she had return of lightheadedness, malaise.  She denies chest pain or shortness of breath, no abdominal pain, no fever, no cough, return of diarrhea today as well.  Review of Systems  A complete 10 system review of systems was obtained and all systems are negative except as noted in the HPI and PMH.   Patient's Health History    Past Medical History:  Diagnosis Date  . Allergy   . Atrial fibrillation with tachycardic ventricular rate ([place]) 12/08/2019  . Seasonal allergies   . Vitamin D deficiency     Past Surgical History:  Procedure Laterality Date  . HERNIA REPAIR    . HERNIA REPAIR     X2  . LEFT HEART CATH AND CORONARY ANGIOGRAPHY N/A 12/11/2019   Procedure: LEFT HEART CATH AND CORONARY ANGIOGRAPHY;  Surgeon: Lorretta Harp, MD;  Location: [facility];  Service: Cardiovascular;  Laterality: N/A;  . TUBAL LIGATION      Family History  Problem Relation Age of Onset  . Heart disease Mother   . Heart disease Father   . Diabetes Paternal Grandfather   . Thyroid disease Neg Hx     Social History   Socioeconomic History  . Marital status: Married    Spouse name: Not on file  . Number of children: Not on file  . Years of education: Not on file  . Highest education level: Not on file  Occupational History  . Not on file  Tobacco Use  . Smoking status: Never Smoker  . Smokeless tobacco:  Never Used  Substance and Sexual Activity  . Alcohol use: No  . Drug use: No  . Sexual activity: Yes    Partners: Male    Birth control/protection: Surgical    Comment: tubal ligation   Other Topics Concern  . Not on file  Social History Narrative  . Not on file   Social Determinants of Health   Financial Resource Strain:   . Difficulty of Paying Living Expenses: Not on file  Food Insecurity:   . Worried About Charity fundraiser in the Last Year: Not on file  . Ran Out of Food in the Last Year: Not on file  Transportation Needs:   . Lack of Transportation (Medical): Not on file  . Lack of Transportation (Non-Medical): Not on file  Physical Activity:   . Days of Exercise per Week: Not on file  . Minutes of Exercise per Session: Not on file  Stress:   . Feeling of Stress : Not on file  Social Connections:   . Frequency of Communication with Friends and Family: Not on file  . Frequency of Social Gatherings with Friends and Family: Not on file  . Attends Religious Services: Not on file  . Active Member of Clubs or Organizations: Not on file  . Attends Archivist Meetings: Not on file  . Marital Status: Not on file  Intimate Partner Violence:   . Fear of Current or Ex-Partner: Not on file  . Emotionally Abused: Not on file  . Physically Abused: Not on file  . Sexually Abused: Not on file     Physical Exam   Vitals:   12/13/19 1829 12/13/19 2030  BP: (!) 147/81 (!) 164/79  Pulse:  (!) 59  Resp:  15  Temp:    SpO2:  98%    CONSTITUTIONAL: Well-appearing, NAD NEURO:  Alert and oriented x 3, no focal deficits EYES:  eyes equal and reactive ENT/NECK:  no LAD, no JVD CARDIO: Regular rate, well-perfused, normal S1 and S2 PULM:  CTAB no wheezing or rhonchi GI/GU:  normal bowel sounds, non-distended, non-tender MSK/SPINE:  No gross deformities, no edema SKIN:  no rash, atraumatic PSYCH:  Appropriate speech and behavior  *Additional and/or pertinent findings  included in MDM below  Diagnostic and Interventional Summary    EKG Interpretation  Date/Time:  Wednesday December 13 2019 18:57:56 EDT Ventricular Rate:  64 PR Interval:    QRS Duration: 79 QT Interval:  417 QTC Calculation: 431 R Axis:   58 Text Interpretation: Sinus rhythm Confirmed by Gerlene Fee [phone]) on 12/13/2019 8:00:49 PM      Labs Reviewed  CBC - Abnormal; Notable for the following components:      Result Value   RBC 5.20 (*)    All other components within normal limits  COMPREHENSIVE METABOLIC PANEL - Abnormal; Notable for the following components:   Glucose, Bld 195 (*)    AST 117 (*)    ALT 148 (*)    All other components within normal limits  D-DIMER, QUANTITATIVE (NOT AT [facility]) - Abnormal; Notable for the following components:   D-Dimer, Quant 0.66 (*)    All other components within normal limits  PROTIME-INR    CT ANGIO CHEST PE W OR WO CONTRAST  Final Result    CT HEAD WO CONTRAST  Final Result      Medications  sodium chloride 0.9 % bolus 1,000 mL (0 mLs Intravenous Stopped 12/13/19 2043)  acetaminophen (TYLENOL) tablet 1,000 mg (1,000 mg Oral Given 12/13/19 2118)  iohexol (OMNIPAQUE) 350 MG/ML injection 100 mL (100 mLs Intravenous Contrast Given 12/13/19 2054)     Procedures  /  Critical Care Procedures  ED Course and Medical Decision Making  I have reviewed the triage vital signs, the nursing notes, and pertinent available records from the EMR.  Listed above are laboratory and imaging tests that I personally ordered, reviewed, and interpreted and then considered in my medical decision making (see below for details).  Recent hospitalization with extensive work-up including cardiac catheterization which was normal.  Lightheadedness, normal neurological exam, patient did have electrolyte disturbance in the hospital, will check again today also screen with D-dimer.     D-dimer positive, patient also endorsing chronic headaches worsening over  the past few weeks.  CT imaging of the chest and brain are reassuring, no masses, no bleeding, no PE.  Patient continues to look well, normal vital signs, appropriate for discharge with PCP follow-up.  Barth Kirks. Sedonia Small, MD [place] [email or web address]  Final Clinical Impressions(s) / ED Diagnoses     ICD-10-CM   1. Dizziness  R42   2. Diarrhea, unspecified type  R19.7     ED Discharge Orders         Ordered    ondansetron (ZOFRAN ODT) 4 MG disintegrating tablet  Every 8 hours PRN  12/13/19 2207           Discharge Instructions Discussed with and Provided to Patient:     Discharge Instructions     You were evaluated in the Emergency Department and after careful evaluation, we did not find any emergent condition requiring admission or further testing in the hospital.  Your exam/testing today was overall reassuring.  Please use the Zofran medication as needed for nausea and follow-up with your primary care doctor.  Please return to the Emergency Department if you experience any worsening of your condition.  Thank you for allowing [place] to be a part of your care.        Maudie Flakes, MD 12/13/19 2208

## 2019-12-13 NOTE — ED Triage Notes (Addendum)
C/o dizziness and diarrhea x 6 days  Recent admit to [facility] for same , Heart cath neg , echo neg , cont n/v/d

## 2019-12-13 NOTE — ED Notes (Signed)
Pt to CT

## 2019-12-13 NOTE — ED Notes (Signed)
Pt on monitor 

## 2019-12-13 NOTE — Discharge Instructions (Addendum)
You were evaluated in the Emergency Department and after careful evaluation, we did not find any emergent condition requiring admission or further testing in the hospital.  Your exam/testing today was overall reassuring.  Please use the Zofran medication as needed for nausea and follow-up with your primary care doctor.  Please return to the Emergency Department if you experience any worsening of your condition.  Thank you for allowing [place] to be a part of your care.

## 2019-12-15 DIAGNOSIS — I4891 Unspecified atrial fibrillation: Secondary | ICD-10-CM | POA: Diagnosis not present

## 2019-12-15 DIAGNOSIS — E876 Hypokalemia: Secondary | ICD-10-CM | POA: Diagnosis not present

## 2019-12-15 DIAGNOSIS — R748 Abnormal levels of other serum enzymes: Secondary | ICD-10-CM | POA: Diagnosis not present

## 2019-12-15 DIAGNOSIS — R197 Diarrhea, unspecified: Secondary | ICD-10-CM | POA: Diagnosis not present

## 2019-12-27 ENCOUNTER — Ambulatory Visit: Payer: BC Managed Care – PPO | Admitting: Cardiology

## 2020-01-09 DIAGNOSIS — Z20822 Contact with and (suspected) exposure to covid-19: Secondary | ICD-10-CM | POA: Diagnosis not present

## 2020-01-17 DIAGNOSIS — R748 Abnormal levels of other serum enzymes: Secondary | ICD-10-CM | POA: Diagnosis not present

## 2020-02-12 ENCOUNTER — Ambulatory Visit: Payer: BC Managed Care – PPO | Admitting: Cardiology

## 2020-02-12 ENCOUNTER — Encounter: Payer: Self-pay | Admitting: Cardiology

## 2020-02-12 ENCOUNTER — Other Ambulatory Visit: Payer: Self-pay

## 2020-02-12 VITALS — BP 126/70 | HR 79 | Ht 62.0 in | Wt 184.0 lb

## 2020-02-12 DIAGNOSIS — K529 Noninfective gastroenteritis and colitis, unspecified: Secondary | ICD-10-CM

## 2020-02-12 DIAGNOSIS — E782 Mixed hyperlipidemia: Secondary | ICD-10-CM

## 2020-02-12 DIAGNOSIS — E0590 Thyrotoxicosis, unspecified without thyrotoxic crisis or storm: Secondary | ICD-10-CM

## 2020-02-12 DIAGNOSIS — I4891 Unspecified atrial fibrillation: Secondary | ICD-10-CM

## 2020-02-12 DIAGNOSIS — Z8249 Family history of ischemic heart disease and other diseases of the circulatory system: Secondary | ICD-10-CM

## 2020-02-12 DIAGNOSIS — R778 Other specified abnormalities of plasma proteins: Secondary | ICD-10-CM | POA: Diagnosis not present

## 2020-02-12 MED ORDER — METOPROLOL SUCCINATE ER 25 MG PO TB24: 25.0000 mg | ORAL_TABLET | Freq: Every day | ORAL | 11 refills | Status: DC

## 2020-02-12 NOTE — Assessment & Plan Note (Signed)
Check TSH today

## 2020-02-12 NOTE — Assessment & Plan Note (Signed)
Mother died in her 17's of CAD Father developed CAD in his 36's

## 2020-02-12 NOTE — Assessment & Plan Note (Signed)
Admitted with N&V 12/08/2019

## 2020-02-12 NOTE — Patient Instructions (Signed)
Medication Instructions:  Continue current medications  *If you need a refill on your cardiac medications before your next appointment, please call your pharmacy*   Lab Work: TSH Today  If you have labs (blood work) drawn today and your tests are completely normal, you will receive your results only by: Marland Kitchen MyChart Message (if you have MyChart) OR . A paper copy in the mail If you have any lab test that is abnormal or we need to change your treatment, we will call you to review the results.   Testing/Procedures: None ordered   Follow-Up: At [facility], you and your health needs are our priority.  As part of our continuing mission to provide you with exceptional heart care, we have created designated Provider Care Teams.  These Care Teams include your primary Cardiologist (physician) and Advanced Practice Providers (APPs -  Physician Assistants and Nurse Practitioners) who all work together to provide you with the care you need, when you need it.  We recommend signing up for the patient portal called "MyChart".  Sign up information is provided on this After Visit Summary.  MyChart is used to connect with patients for Virtual Visits (Telemedicine).  Patients are able to view lab/test results, encounter notes, upcoming appointments, etc.  Non-urgent messages can be sent to your provider as well.   To learn more about what you can do with MyChart, go to [email or web address].    Your next appointment:   6 month(s)  The format for your next appointment:   In Person  Provider:   You may see Pixie Casino, MD or one of the following Advanced Practice Providers on your designated Care Team:    Almyra Deforest, PA-C  Fabian Sharp, PA-C or   Roby Lofts, [place]

## 2020-02-12 NOTE — Assessment & Plan Note (Signed)
LDL 207 Oct 2021- Statin Rx stopped by her PCP secondary to elevated LFTs

## 2020-02-12 NOTE — Progress Notes (Signed)
Cardiology Office Note:    Date:  02/12/2020   ID:  Dana Rojas, DOB 12-01-1970, MRN [id number]  PCP:  Hulan Fess, MD  Cardiologist:  Pixie Casino, MD  Electrophysiologist:  None   Referring MD: Hulan Fess, MD   Chief Complaint  Patient presents with  . Follow-up    2 weeks post hospital.    History of Present Illness:    Dana Rojas is a 49 y.o. female with a hx of vitamin D deficiency who was admitted through the emergency room 02/01/2020 with intractable nausea and vomiting.  On admission she was bradycardic with a heart rate in the 40s.  While in the emergency room her heart rate shot up to 140 after IV fluids were given.  EKG showed atrial fibrillation with RVR.  Her troponin is also were elevated with a troponin peak of 2600.  Cardiology was consulted.  Echocardiogram showed normal LV function with mild LVH.  The patient did convert spontaneously with beta-blocker back to sinus rhythm.  Diagnostic catheterization was done 02/02/2020 and showed no significant coronary disease and normal LV function.  Recommendations at discharge are for aspirin 81 mg a day and beta-blocker therapy.  If the patient has recurrent PAF long-term oral anticoagulation needs to be considered.  She presents to the office today for follow-up.  Past Medical History:  Diagnosis Date  . Allergy   . Atrial fibrillation with tachycardic ventricular rate ([place]) 12/08/2019  . Seasonal allergies   . Vitamin D deficiency     Past Surgical History:  Procedure Laterality Date  . HERNIA REPAIR    . HERNIA REPAIR     X2  . LEFT HEART CATH AND CORONARY ANGIOGRAPHY N/A 12/11/2019   Procedure: LEFT HEART CATH AND CORONARY ANGIOGRAPHY;  Surgeon: Lorretta Harp, MD;  Location: [facility];  Service: Cardiovascular;  Laterality: N/A;  . TUBAL LIGATION      Current Medications: Current Meds  Medication Sig  . albuterol (PROVENTIL HFA;VENTOLIN HFA) 108 (90 Base) MCG/ACT inhaler Inhale 2  puffs into the lungs every 4 (four) hours as needed for wheezing or shortness of breath (cough, shortness of breath or wheezing.).  Marland Kitchen aspirin EC 81 MG EC tablet Take 1 tablet (81 mg total) by mouth daily. Swallow whole.  . cetirizine (ZYRTEC) 10 MG tablet Take 10 mg by mouth daily.  Marland Kitchen EPINEPHrine 0.3 mg/0.3 mL IJ SOAJ injection Reason minister if you have any respiratory distress following an insect sting and call 911 (Patient taking differently: Inject 0.3 mg into the muscle as needed for anaphylaxis (see instructions.). Reason minister if you have any respiratory distress following an insect sting and call 911)  . fluticasone (FLONASE) 50 MCG/ACT nasal spray Place 2 sprays into both nostrils daily. (Patient taking differently: Place 2 sprays into both nostrils daily as needed for allergies.)  . [DISCONTINUED] atorvastatin (LIPITOR) 80 MG tablet Take 1 tablet (80 mg total) by mouth at bedtime.  . [DISCONTINUED] metoprolol succinate (TOPROL XL) 25 MG 24 hr tablet Take 1 tablet (25 mg total) by mouth daily.  . [DISCONTINUED] ondansetron (ZOFRAN ODT) 4 MG disintegrating tablet Take 1 tablet (4 mg total) by mouth every 8 (eight) hours as needed for nausea or vomiting.     Allergies:   Bee venom   Social History   Socioeconomic History  . Marital status: Married    Spouse name: Not on file  . Number of children: Not on file  . Years of education:  Not on file  . Highest education level: Not on file  Occupational History  . Not on file  Tobacco Use  . Smoking status: Never Smoker  . Smokeless tobacco: Never Used  Substance and Sexual Activity  . Alcohol use: No  . Drug use: No  . Sexual activity: Yes    Partners: Male    Birth control/protection: Surgical    Comment: tubal ligation   Other Topics Concern  . Not on file  Social History Narrative  . Not on file   Social Determinants of Health   Financial Resource Strain: Not on file  Food Insecurity: Not on file  Transportation  Needs: Not on file  Physical Activity: Not on file  Stress: Not on file  Social Connections: Not on file     Family History: The patient's family history includes Diabetes in her paternal grandfather; Heart disease in her father and mother. There is no history of Thyroid disease.  ROS:   Please see the history of present illness.     All other systems reviewed and are negative.  EKGs/Labs/Other Studies Reviewed:    The following studies were reviewed today: Echo 12/09/2019- IMPRESSIONS    1. Left ventricular ejection fraction, by estimation, is 60 to 65%. The  left ventricle has normal function. The left ventricle has no regional  wall motion abnormalities. There is mild left ventricular hypertrophy.  Left ventricular diastolic parameters  were normal.  2. Right ventricular systolic function is normal. The right ventricular  size is normal. There is normal pulmonary artery systolic pressure. The  estimated right ventricular systolic pressure is 69.6 mmHg.  3. The mitral valve is normal in structure. Mild mitral valve  regurgitation. No evidence of mitral stenosis.  4. The aortic valve is tricuspid. Aortic valve regurgitation is not  visualized. No aortic stenosis is present.  5. The inferior vena cava is normal in size with <50% respiratory  variability, suggesting right atrial pressure of 8 mmHg.   Cath 12/11/2019- IMPRESSION: Ms. Wheelwright has normal coronary arteries and normal filling pressures.  I believe her elevated enzymes are related to her A. fib with RVR with demand ischemia.  Medical therapy will be recommended.  She will most likely required a DOAC for her A. fib but I will defer to Dr. Tamala Julian, her attending cardiologist and Dr. Debara Pickett.  Her sheath was removed and a TR band was placed on the right wrist to achieve patent hemostasis.  The patient left lab stable condition.    EKG:  EKG is ordered today.  The ekg ordered today demonstrates NSR, HR 79  Recent  Labs: 12/10/2019: Magnesium 1.8 12/13/2019: ALT 148; BUN 13; Creatinine, Ser 0.65; Hemoglobin 13.8; Platelets 317; Potassium 4.2; Sodium 138  Recent Lipid Panel    Component Value Date/Time   CHOL 285 (H) 12/09/2019 0825   TRIG 219 (H) 12/09/2019 0825   HDL 34 (L) 12/09/2019 0825   CHOLHDL 8.4 12/09/2019 0825   VLDL 44 (H) 12/09/2019 0825   LDLCALC 207 (H) 12/09/2019 0825   LDLDIRECT 156.4 08/08/2009 0737    Physical Exam:    VS:  BP 126/70 (BP Location: Left Arm, Patient Position: Bed low/side rails up, Cuff Size: Normal)   Pulse 79   Ht 5\' 2"  (1.575 m)   Wt 184 lb (83.5 kg)   BMI 33.65 kg/m     Wt Readings from Last 3 Encounters:  02/12/20 184 lb (83.5 kg)  12/11/19 180 lb 11.2 oz (82 kg)  04/30/16  175 lb (79.4 kg)     GEN:  Overweight female, well developed in no acute distress HEENT: Normal NECK: No JVD; No carotid bruits CARDIAC: RRR, no murmurs, rubs, gallops RESPIRATORY:  Clear to auscultation without rales, wheezing or rhonchi  ABDOMEN: Soft, non-tender, non-distended MUSCULOSKELETAL:  No edema; No deformity  SKIN: Warm and dry NEUROLOGIC:  Alert and oriented x 3 PSYCHIATRIC:  Normal affect   ASSESSMENT:    Hyperthyroidism Check TSH today  Gastroenteritis Admitted with N&V 12/08/2019  Hyperlipidemia LDL 207 Oct 2021- Statin Rx stopped by her PCP secondary to elevated LFTs- ALT 148, AST 117.  F/U labs ordered by her PCP  Troponin level elevated Troponin 2676 in the setting of AV with RVR and intractable N&V-  Cath 12/11/2019 showed normal coronaries- normal LVF  Family history of coronary artery disease Mother died in her 31's of CAD Father developed CAD in his 59's  PLAN:    She has not had recurrent PAF or nausea / vomiting.  Continue Toprol and aspirin 81mg  per Dr Thompson Caul recommendation. Check TSH today.   She has f/u LFTs scheduled with her PCP- will defer lipid therapy to him.  F/U Dr Debara Pickett in 6 months.    Medication Adjustments/Labs  and Tests Ordered: Current medicines are reviewed at length with the patient today.  Concerns regarding medicines are outlined above.  Orders Placed This Encounter  Procedures  . TSH  . EKG 12-Lead   Meds ordered this encounter  Medications  . metoprolol succinate (TOPROL XL) 25 MG 24 hr tablet    Sig: Take 1 tablet (25 mg total) by mouth daily.    Dispense:  30 tablet    Refill:  11    Patient Instructions  Medication Instructions:  Continue current medications  *If you need a refill on your cardiac medications before your next appointment, please call your pharmacy*   Lab Work: TSH Today  If you have labs (blood work) drawn today and your tests are completely normal, you will receive your results only by: Marland Kitchen MyChart Message (if you have MyChart) OR . A paper copy in the mail If you have any lab test that is abnormal or we need to change your treatment, we will call you to review the results.   Testing/Procedures: None ordered   Follow-Up: At [facility], you and your health needs are our priority.  As part of our continuing mission to provide you with exceptional heart care, we have created designated Provider Care Teams.  These Care Teams include your primary Cardiologist (physician) and Advanced Practice Providers (APPs -  Physician Assistants and Nurse Practitioners) who all work together to provide you with the care you need, when you need it.  We recommend signing up for the patient portal called "MyChart".  Sign up information is provided on this After Visit Summary.  MyChart is used to connect with patients for Virtual Visits (Telemedicine).  Patients are able to view lab/test results, encounter notes, upcoming appointments, etc.  Non-urgent messages can be sent to your provider as well.   To learn more about what you can do with MyChart, go to [email or web address].    Your next appointment:   6 month(s)  The format for your next appointment:   In  Person  Provider:   You may see Pixie Casino, MD or one of the following Advanced Practice Providers on your designated Care Team:    Almyra Deforest, PA-C  Fabian Sharp, PA-C or   Roby Lofts,  PA-C        Signed, Kerin Ransom, PA-C  02/12/2020 12:21 PM    [facility]

## 2020-02-12 NOTE — Assessment & Plan Note (Signed)
Troponin 2676 in the setting of AV with RVR and intractable N&V-  Cath 12/11/2019 showed normal coronaries- normal LVF

## 2020-02-13 LAB — TSH: TSH: 0.423 u[IU]/mL — ABNORMAL LOW (ref 0.450–4.500)

## 2020-02-15 ENCOUNTER — Other Ambulatory Visit: Payer: Self-pay

## 2020-02-15 DIAGNOSIS — E0590 Thyrotoxicosis, unspecified without thyrotoxic crisis or storm: Secondary | ICD-10-CM

## 2020-02-15 DIAGNOSIS — I4891 Unspecified atrial fibrillation: Secondary | ICD-10-CM | POA: Diagnosis not present

## 2020-02-16 LAB — T4, FREE: Free T4: 1.22 ng/dL (ref 0.82–1.77)

## 2020-02-24 DIAGNOSIS — Z20828 Contact with and (suspected) exposure to other viral communicable diseases: Secondary | ICD-10-CM | POA: Diagnosis not present

## 2020-08-13 ENCOUNTER — Other Ambulatory Visit: Payer: Self-pay

## 2020-08-13 ENCOUNTER — Encounter: Payer: Self-pay | Admitting: Internal Medicine

## 2020-08-13 ENCOUNTER — Ambulatory Visit: Payer: BC Managed Care – PPO | Admitting: Internal Medicine

## 2020-08-13 VITALS — BP 146/90 | HR 63 | Ht 62.0 in | Wt 173.4 lb

## 2020-08-13 DIAGNOSIS — I480 Paroxysmal atrial fibrillation: Secondary | ICD-10-CM | POA: Diagnosis not present

## 2020-08-13 DIAGNOSIS — E7800 Pure hypercholesterolemia, unspecified: Secondary | ICD-10-CM | POA: Diagnosis not present

## 2020-08-13 DIAGNOSIS — I10 Essential (primary) hypertension: Secondary | ICD-10-CM | POA: Diagnosis not present

## 2020-08-13 NOTE — Patient Instructions (Signed)
Medication Instructions:  No Changes In Medications at this time.  *If you need a refill on your cardiac medications before your next appointment, please call your pharmacy*  Lab Work: [place] 3 MONTHS TO RECHECK CHOLESTEROL  If you have labs (blood work) drawn today and your tests are completely normal, you will receive your results only by: [place] (if you have MyChart) OR A paper copy in the mail If you have any lab test that is abnormal or we need to change your treatment, we will call you to review the results.  Follow-Up: At [facility], you and your health needs are our priority.  As part of our continuing mission to provide you with exceptional heart care, we have created designated Provider Care Teams.  These Care Teams include your primary Cardiologist (physician) and Advanced Practice Providers (APPs -  Physician Assistants and Nurse Practitioners) who all work together to provide you with the care you need, when you need it.  We recommend signing up for the patient portal called "MyChart".  Sign up information is provided on this After Visit Summary.  MyChart is used to connect with patients for Virtual Visits (Telemedicine).  Patients are able to view lab/test results, encounter notes, upcoming appointments, etc.  Non-urgent messages can be sent to your provider as well.   To learn more about what you can do with MyChart, go to [email or web address].    Your next appointment:   1 year(s)  The format for your next appointment:   In Person  Provider:   K. Mali Hilty, MD  Other Instructions PLEASE REFER TO DIET [place]

## 2020-08-13 NOTE — Progress Notes (Signed)
OFFICE NOTE  Chief Complaint:  No complaints  Primary Care Physician: Hulan Fess, MD  HPI:  Dana Rojas is a 50 y.o. female with a past medial history significant for hypertension, dyspnea and tachycardia.  She was found to be in A. fib with RVR this past October and had intractable nausea, vomiting and abdominal pain.  Troponins were elevated to 4000 and she was referred for diagnostic cardiac catheterization.  Fortunately her echo showed normal LV function.  Her catheterization revealed normal coronaries with normal filling pressures.  Given her low CHA2DS2-VASc score, she was recommended to be on aspirin.  She remains on aspirin and metoprolol.  She denies any recurrent atrial fibrillation.  She has had no chest pain.  Of note her cholesterol was markedly elevated October with total cholesterol 285, triglycerides 219, HDL 34 and LDL 207.  Although both parents have heart disease, she is not aware whether they have elevated cholesterol and whether this could be a genetic dyslipidemia.  PMHx:  Past Medical History:  Diagnosis Date   Allergy    Atrial fibrillation with tachycardic ventricular rate ([place]) 12/08/2019   Seasonal allergies    Vitamin D deficiency     Past Surgical History:  Procedure Laterality Date   HERNIA REPAIR     HERNIA REPAIR     X2   LEFT HEART CATH AND CORONARY ANGIOGRAPHY N/A 12/11/2019   Procedure: LEFT HEART CATH AND CORONARY ANGIOGRAPHY;  Surgeon: Lorretta Harp, MD;  Location: [facility];  Service: Cardiovascular;  Laterality: N/A;   TUBAL LIGATION      FAMHx:  Family History  Problem Relation Age of Onset   Heart disease Mother    Heart disease Father    Diabetes Paternal Grandfather    Thyroid disease Neg Hx     SOCHx:   reports that she has never smoked. She has never used smokeless tobacco. She reports that she does not drink alcohol and does not use drugs.  ALLERGIES:  Allergies  Allergen Reactions   Bee Venom      Body swelling    ROS: Pertinent items noted in HPI and remainder of comprehensive ROS otherwise negative.  HOME MEDS: Current Outpatient Medications on File Prior to Visit  Medication Sig Dispense Refill   albuterol (PROVENTIL HFA;VENTOLIN HFA) 108 (90 Base) MCG/ACT inhaler Inhale 2 puffs into the lungs every 4 (four) hours as needed for wheezing or shortness of breath (cough, shortness of breath or wheezing.). 1 Inhaler 1   aspirin EC 81 MG EC tablet Take 1 tablet (81 mg total) by mouth daily. Swallow whole. 30 tablet 11   cetirizine (ZYRTEC) 10 MG tablet Take 10 mg by mouth daily.     fluticasone (FLONASE) 50 MCG/ACT nasal spray Place 2 sprays into both nostrils daily. (Patient taking differently: Place 2 sprays into both nostrils daily as needed for allergies.) 16 g 12   ipratropium (ATROVENT) 0.03 % nasal spray Place 2 sprays into the nose 2 (two) times daily. 30 mL 0   EPINEPHrine 0.3 mg/0.3 mL IJ SOAJ injection Reason minister if you have any respiratory distress following an insect sting and call 911 (Patient taking differently: Inject 0.3 mg into the muscle as needed for anaphylaxis (see instructions.). Reason minister if you have any respiratory distress following an insect sting and call 911) 1 Device 2   metoprolol succinate (TOPROL XL) 25 MG 24 hr tablet Take 1 tablet (25 mg total) by mouth daily. 30 tablet 11  No current facility-administered medications on file prior to visit.    LABS/IMAGING: No results found for this or any previous visit (from the past 48 hour(s)). No results found.  LIPID PANEL:    Component Value Date/Time   CHOL 285 (H) 12/09/2019 0825   TRIG 219 (H) 12/09/2019 0825   HDL 34 (L) 12/09/2019 0825   CHOLHDL 8.4 12/09/2019 0825   VLDL 44 (H) 12/09/2019 0825   LDLCALC 207 (H) 12/09/2019 0825   LDLDIRECT 156.4 08/08/2009 0737     WEIGHTS: Wt Readings from Last 3 Encounters:  08/13/20 173 lb 6.4 oz (78.7 kg)  02/12/20 184 lb (83.5 kg)  12/11/19  180 lb 11.2 oz (82 kg)    VITALS: BP (!) 146/90 (BP Location: Left Arm, Patient Position: Sitting, Cuff Size: Normal)   Pulse 63   Ht 5\' 2"  (1.575 m)   Wt 173 lb 6.4 oz (78.7 kg)   SpO2 99%   BMI 31.72 kg/m   EXAM: General appearance: alert and no distress Neck: no carotid bruit, no JVD, and thyroid not enlarged, symmetric, no tenderness/mass/nodules Lungs: clear to auscultation bilaterally Heart: regular rate and rhythm, S1, S2 normal, no murmur, click, rub or gallop Abdomen: soft, non-tender; bowel sounds normal; no masses,  no organomegaly Extremities: extremities normal, atraumatic, no cyanosis or edema Pulses: 2+ and symmetric Skin: Skin color, texture, turgor normal. No rashes or lesions Neurologic: Grossly normal Cycle  EKG: Normal sinus rhythm at 63- personally reviewed  ASSESSMENT: PAF, CHA2DS2-VASc score of 1 Hypertension Dyslipidemia with LDL cholesterol greater than 190  PLAN: 1.   Ms. Mohammad had brief atrial fibrillation and elevated troponin with a cardiovascular event that was unexplained.  Her echo however showed normal LV function and her coronaries were normal.  She has had no further A. fib.  I would recommend she remain on low-dose aspirin and metoprolol however.  Blood pressure is somewhat elevated today.  She would benefit from weight loss and dietary changes.  She reports a pretty high saturated fat diet with a lot of fried foods.  This could explain her LDL cholesterol.  I like to repeat lipids in about 3 months and provided with dietary information today as well as discussed some healthy eating options for her.  We will consider therapy for her lipids if they are not significantly improved and plan annual follow-up or sooner as necessary.  Pixie Casino, MD, [facility], [place] Director of the [facility]  [facility] Diplomate of the American Board of Clinical  Lipidology Attending Cardiologist  Direct Dial: [phone]  Fax: [phone]  Website:  [email or web address] 08/13/2020, 9:55 AM

## 2020-11-02 DIAGNOSIS — Z20822 Contact with and (suspected) exposure to covid-19: Secondary | ICD-10-CM | POA: Diagnosis not present

## 2020-12-07 DIAGNOSIS — Z20822 Contact with and (suspected) exposure to covid-19: Secondary | ICD-10-CM | POA: Diagnosis not present

## 2021-01-04 IMAGING — CT CT HEAD W/O CM
3 series · 15 of 47 positions shown, 18 images · non-contrast
Comparison: None.

CLINICAL DATA: Chronic headache, dizziness

EXAM:
CT HEAD WITHOUT CONTRAST
TECHNIQUE: Contiguous axial images were obtained from the base of the skull
through the vertex without intravenous contrast.

[Series 2: head wo · axial · 0.42mm/px · z∈[+1151,+1276]mm · 9 of 31 slices shown, 12 images]
[im 3/31  brain]
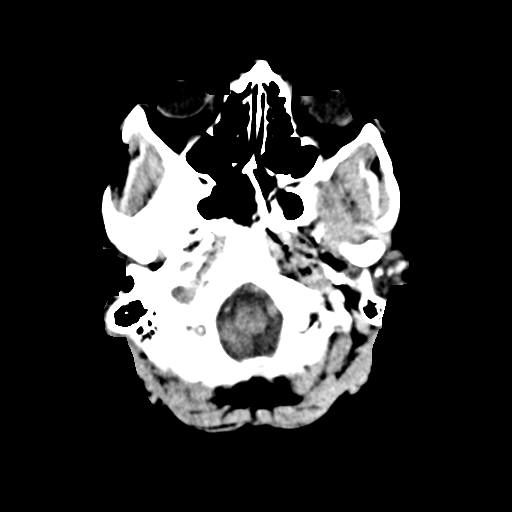
[im 3/31  bone]
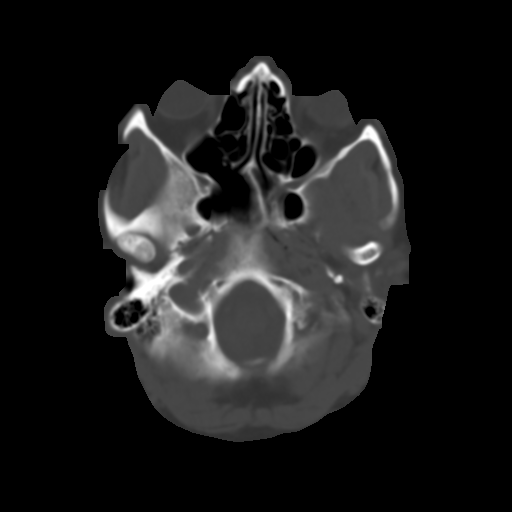
[im 6/31  brain]
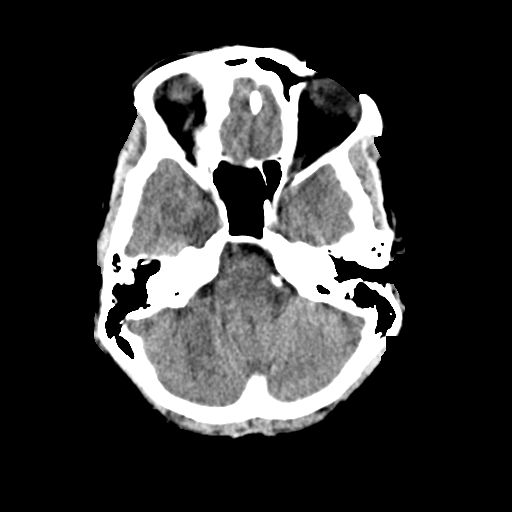
[im 9/31  brain]
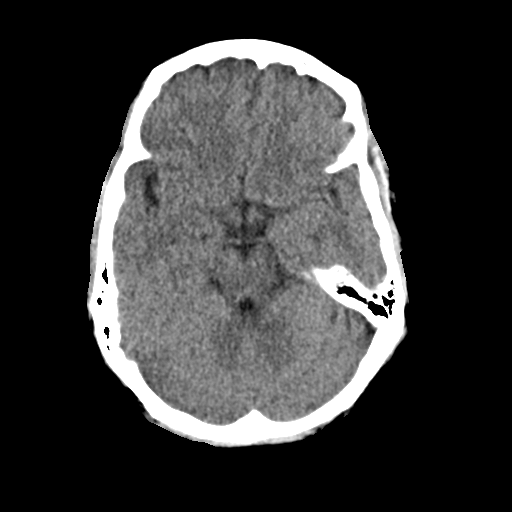
[im 12/31  brain]
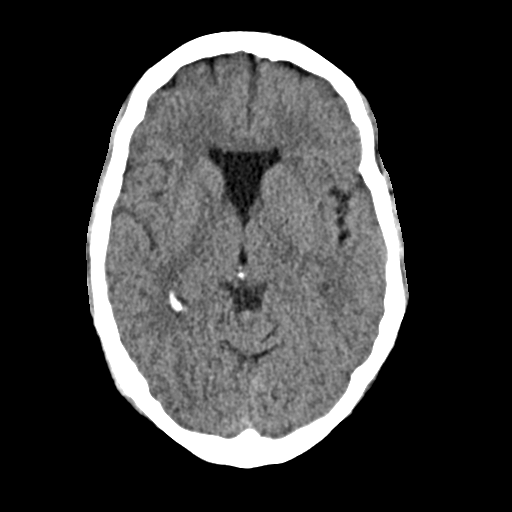
[im 16/31  brain]
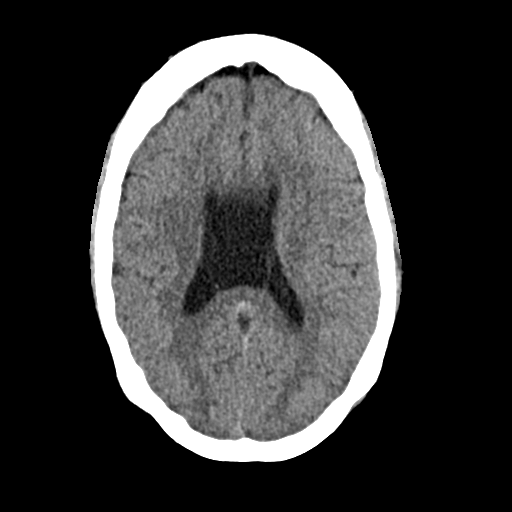
[im 16/31  bone]
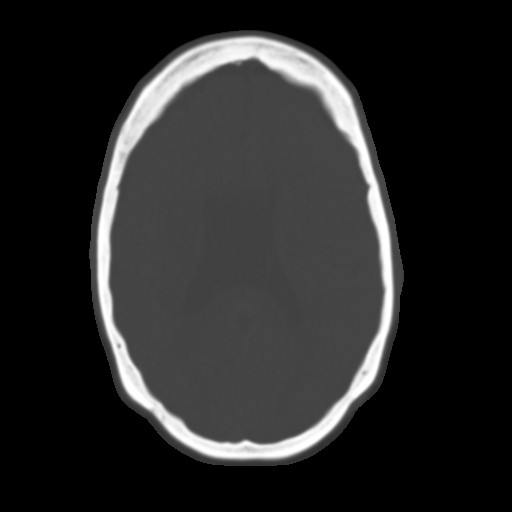
[im 19/31  brain]
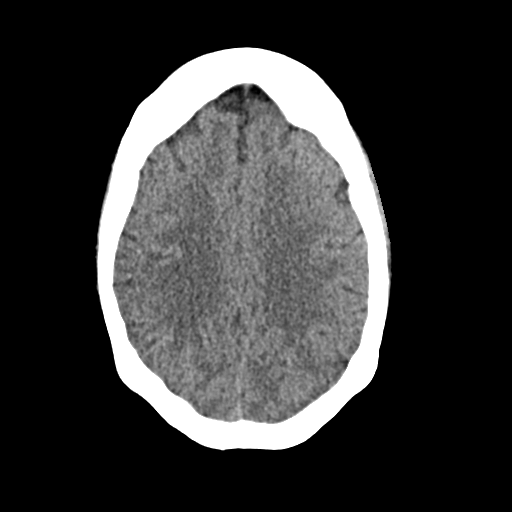
[im 22/31  brain]
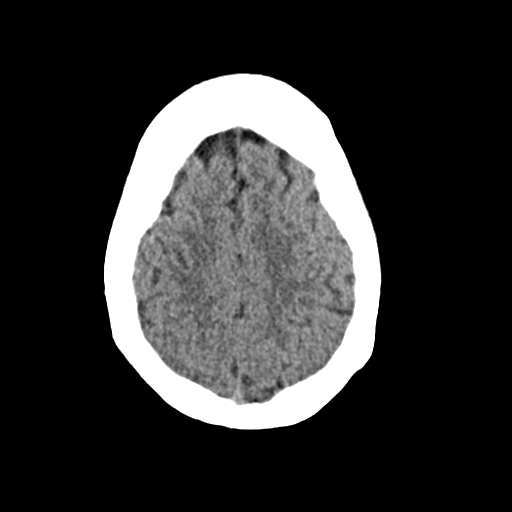
[im 25/31  brain]
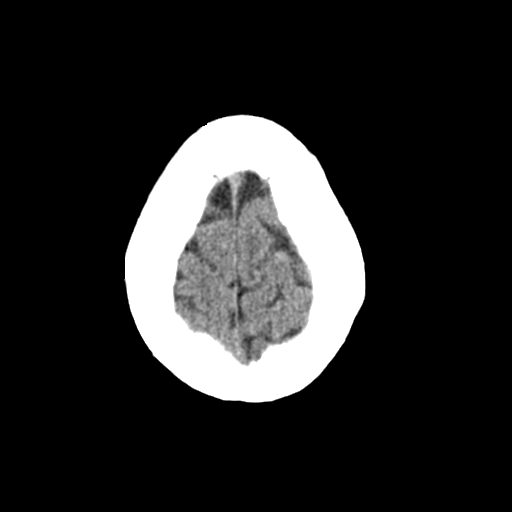
[im 28/31  brain]
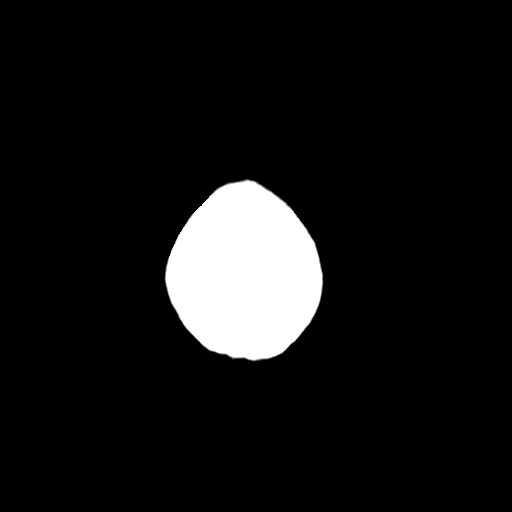
[im 28/31  bone]
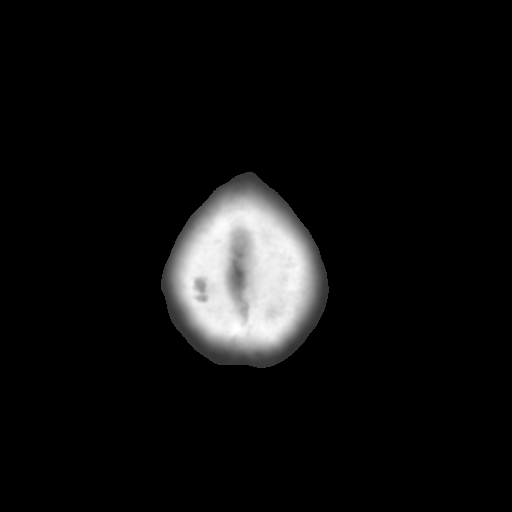

[Series 4: coronal soft · coronal · 0.30mm/px · 3 of 66 slices shown]
[im 22/66  brain]
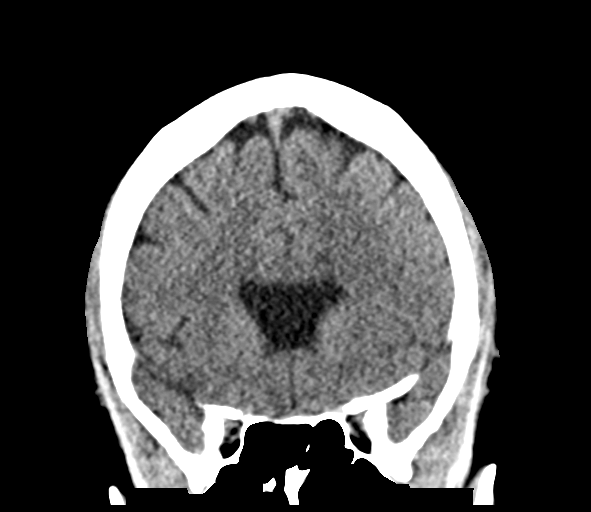
[im 29/66  brain]
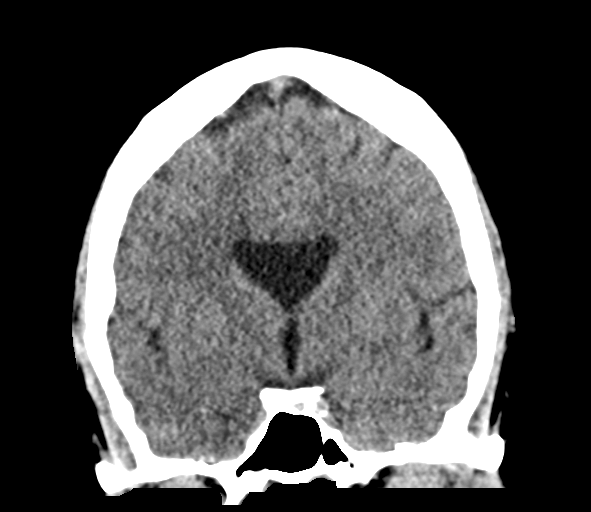
[im 37/66  brain]
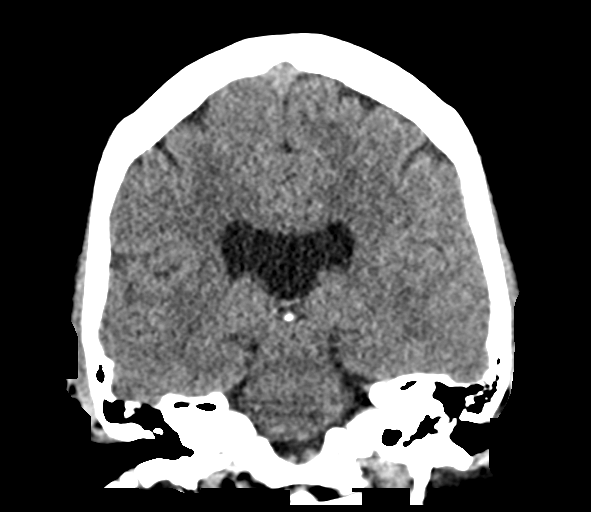

[Series 5: sag soft · sagittal · 0.30mm/px · 3 of 50 slices shown]
[im 17/50  brain]
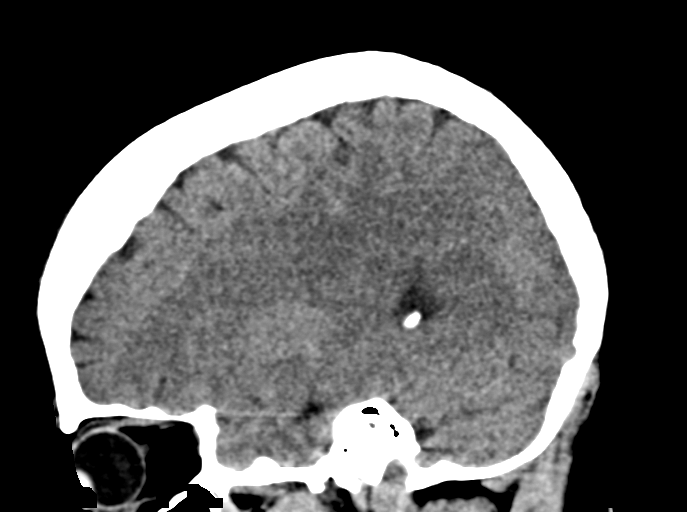
[im 25/50  brain]
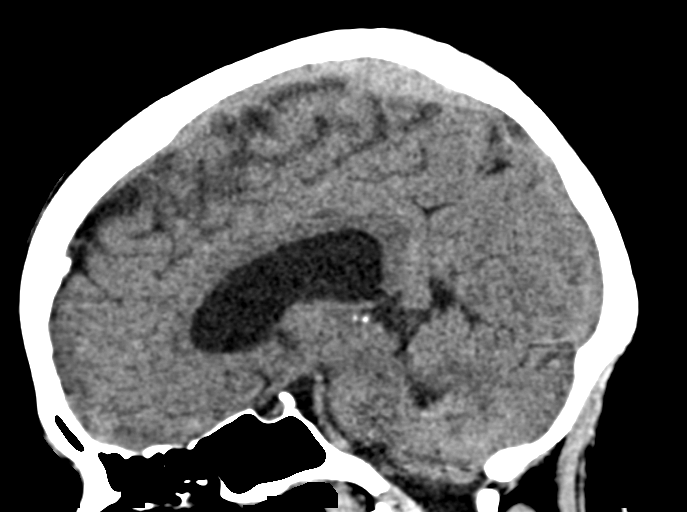
[im 33/50  brain]
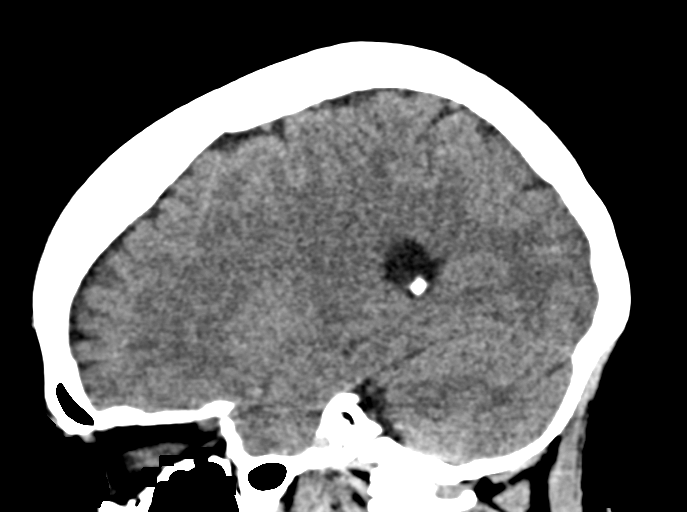

[15 of 47 positions shown; findings below may reference images not displayed]

FINDINGS: Brain: No acute intracranial abnormality. Specifically, no
hemorrhage, hydrocephalus, mass lesion, acute infarction, or
significant intracranial injury.

Vascular: No hyperdense vessel or unexpected calcification.

Skull: No acute calvarial abnormality.

Sinuses/Orbits: No acute findings

Other: None
IMPRESSION: No acute intracranial abnormality.

## 2021-01-09 DIAGNOSIS — N898 Other specified noninflammatory disorders of vagina: Secondary | ICD-10-CM | POA: Diagnosis not present

## 2021-01-09 DIAGNOSIS — R319 Hematuria, unspecified: Secondary | ICD-10-CM | POA: Diagnosis not present

## 2021-01-28 ENCOUNTER — Ambulatory Visit: Payer: BC Managed Care – PPO | Admitting: Nurse Practitioner

## 2021-01-28 ENCOUNTER — Other Ambulatory Visit: Payer: Self-pay

## 2021-01-28 ENCOUNTER — Encounter: Payer: Self-pay | Admitting: Nurse Practitioner

## 2021-01-28 DIAGNOSIS — R300 Dysuria: Secondary | ICD-10-CM

## 2021-01-28 DIAGNOSIS — N9089 Other specified noninflammatory disorders of vulva and perineum: Secondary | ICD-10-CM | POA: Diagnosis not present

## 2021-01-28 DIAGNOSIS — B3731 Acute candidiasis of vulva and vagina: Secondary | ICD-10-CM | POA: Diagnosis not present

## 2021-01-28 LAB — WET PREP FOR TRICH, YEAST, CLUE

## 2021-01-28 LAB — URINALYSIS, COMPLETE W/RFL CULTURE
Bacteria, UA: NONE SEEN /HPF
Bilirubin Urine: NEGATIVE
Casts: NONE SEEN /LPF
Crystals: NONE SEEN /HPF
Hgb urine dipstick: NEGATIVE
Hyaline Cast: NONE SEEN /LPF
Ketones, ur: NEGATIVE
Leukocyte Esterase: NEGATIVE
Nitrites, Initial: NEGATIVE
Protein, ur: NEGATIVE
RBC / HPF: NONE SEEN /HPF (ref 0–2)
Specific Gravity, Urine: 1.038 — ABNORMAL HIGH (ref 1.001–1.035)
WBC, UA: NONE SEEN /HPF (ref 0–5)
Yeast: NONE SEEN /HPF
pH: 5.5 (ref 5.0–8.0)

## 2021-01-28 LAB — NO CULTURE INDICATED

## 2021-01-28 MED ORDER — FLUCONAZOLE 150 MG PO TABS: 150.0000 mg | ORAL_TABLET | ORAL | 0 refills | Status: DC

## 2021-01-28 MED ORDER — VALACYCLOVIR HCL 1 G PO TABS: 1000.0000 mg | ORAL_TABLET | Freq: Two times a day (BID) | ORAL | 0 refills | Status: AC

## 2021-01-28 NOTE — Addendum Note (Signed)
Addended byMarny Lowenstein on: 01/28/2021 03:35 PM   Modules accepted: Orders

## 2021-01-28 NOTE — Progress Notes (Addendum)
   Acute Office Visit  Subjective:    Patient ID: Dana Rojas, female    DOB: Jun 25, 1970, 50 y.o.   MRN: [id number]   HPI 50 y.o. presents today for vaginal itching and burning. She was treated for UTI and yeast infection 2 weeks ago. UTI symptoms have improved but she is still worried because it burns when she pees. She noticed some blisters on vulva. Denies vaginal discharge or odor.    Review of Systems  Constitutional: Negative.   Genitourinary:  Positive for dysuria, genital sores and vaginal pain. Negative for flank pain, frequency, hematuria, urgency and vaginal discharge.      Objective:    Physical Exam Constitutional:      Appearance: Normal appearance.  Genitourinary:    Vagina: Normal.     Cervix: Normal.      LMP 11/26/2020  Wt Readings from Last 3 Encounters:  08/13/20 173 lb 6.4 oz (78.7 kg)  02/12/20 184 lb (83.5 kg)  12/11/19 180 lb 11.2 oz (82 kg)   Wet prep + yeast UA negative     Assessment & Plan:   Problem List Items Addressed This Visit   None Visit Diagnoses     Vaginal candidiasis    -  Primary   Relevant Medications   valACYclovir (VALTREX) 1000 MG tablet   fluconazole (DIFLUCAN) 150 MG tablet   Other Relevant Orders   WET PREP FOR TRICH, YEAST, CLUE   Vulvar lesion       Relevant Medications   valACYclovir (VALTREX) 1000 MG tablet   Other Relevant Orders   SureSwab HSV, Type 1/2 DNA, PCR   Burning with urination       Relevant Orders   Urinalysis,Complete w/RFL Culture   WET PREP FOR TRICH, YEAST, CLUE      Plan: Wet prep positive for yeast - Diflucan 150 mg today and repeat in 3 days for total of 2 doses. HSV culture pending. Highly suspicious of HSV so will treat today with Valacyclovir 1000 mg BID x 7 days. UA negative.      Tamela Gammon DNP, 3:35 PM 01/28/2021

## 2021-01-30 LAB — SURESWAB HSV, TYPE 1/2 DNA, PCR
HSV 1 DNA: NOT DETECTED
HSV 2 DNA: DETECTED — AB

## 2021-02-13 ENCOUNTER — Encounter: Payer: Self-pay | Admitting: Nurse Practitioner

## 2021-02-13 ENCOUNTER — Ambulatory Visit: Payer: BC Managed Care – PPO | Admitting: Nurse Practitioner

## 2021-02-13 ENCOUNTER — Other Ambulatory Visit: Payer: Self-pay

## 2021-02-13 VITALS — BP 116/76

## 2021-02-13 DIAGNOSIS — N9089 Other specified noninflammatory disorders of vulva and perineum: Secondary | ICD-10-CM

## 2021-02-13 DIAGNOSIS — Z0001 Encounter for general adult medical examination with abnormal findings: Secondary | ICD-10-CM

## 2021-02-13 DIAGNOSIS — Z113 Encounter for screening for infections with a predominantly sexual mode of transmission: Secondary | ICD-10-CM | POA: Diagnosis not present

## 2021-02-13 DIAGNOSIS — B007 Disseminated herpesviral disease: Secondary | ICD-10-CM

## 2021-02-13 NOTE — Progress Notes (Signed)
° °  Acute Office Visit  Subjective:    Patient ID: Dana Rojas, female    DOB: Apr 22, 1970, 50 y.o.   MRN: [id number]   HPI 50 y.o. presents today for STD screening. 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 Husband tested and was negative for HSV. His provider recommended she have HSV serum drawn as previous culture could be false positive due to antibiotic use. No symptoms today. She would like a full STD screening today.    Review of Systems  Constitutional: Negative.   Genitourinary: Negative.       Objective:    Physical Exam Constitutional:      Appearance: Normal appearance.  Genitourinary:    General: Normal vulva.     Vagina: Normal.     Cervix: Normal.    BP 116/76    LMP 12/19/2020  Wt Readings from Last 3 Encounters:  08/13/20 173 lb 6.4 oz (78.7 kg)  02/12/20 184 lb (83.5 kg)  12/11/19 180 lb 11.2 oz (82 kg)        Assessment & Plan:   Problem List Items Addressed This Visit   None Visit Diagnoses     Screen for STD (sexually transmitted disease)    -  Primary   Relevant Orders   SURESWAB CT/NG/T. vaginalis   RPR   HIV Antibody (routine testing w rflx)   HSV(herpes simplex vrs) 1+2 ab-IgG      Plan: Discussed that HSV cultures have a 100% testing accuracy as they are obtained directly from the lesion and that outbreak likely occurred during that specific time due to illness. We again discussed the nature of HSV and that she likely acquired it from a partner prior to husband since he had a negative IgG antibody test. Will check serum for her reassurance. STD panel pending.      Tamela Gammon DNP, 11:10 AM 02/13/2021

## 2021-02-14 LAB — HSV(HERPES SIMPLEX VRS) I + II AB-IGG
HAV 1 IGG,TYPE SPECIFIC AB: 41.2 index — ABNORMAL HIGH
HSV 2 IGG,TYPE SPECIFIC AB: 15.7 index — ABNORMAL HIGH

## 2021-02-14 LAB — SURESWAB CT/NG/T. VAGINALIS
C. trachomatis RNA, TMA: NOT DETECTED
N. gonorrhoeae RNA, TMA: NOT DETECTED
Trichomonas vaginalis RNA: NOT DETECTED

## 2021-02-14 LAB — RPR: RPR Ser Ql: NONREACTIVE

## 2021-02-14 LAB — HIV ANTIBODY (ROUTINE TESTING W REFLEX): HIV 1&2 Ab, 4th Generation: NONREACTIVE

## 2021-02-25 DIAGNOSIS — E782 Mixed hyperlipidemia: Secondary | ICD-10-CM | POA: Diagnosis not present

## 2021-02-25 DIAGNOSIS — E119 Type 2 diabetes mellitus without complications: Secondary | ICD-10-CM | POA: Diagnosis not present

## 2021-02-25 DIAGNOSIS — Z0000 Encounter for general adult medical examination without abnormal findings: Secondary | ICD-10-CM | POA: Diagnosis not present

## 2021-02-25 DIAGNOSIS — R7309 Other abnormal glucose: Secondary | ICD-10-CM | POA: Diagnosis not present

## 2021-02-25 DIAGNOSIS — E559 Vitamin D deficiency, unspecified: Secondary | ICD-10-CM | POA: Diagnosis not present

## 2021-03-28 DIAGNOSIS — E782 Mixed hyperlipidemia: Secondary | ICD-10-CM | POA: Diagnosis not present

## 2021-03-28 DIAGNOSIS — E119 Type 2 diabetes mellitus without complications: Secondary | ICD-10-CM | POA: Diagnosis not present

## 2021-04-08 ENCOUNTER — Other Ambulatory Visit: Payer: Self-pay | Admitting: *Deleted

## 2021-04-08 MED ORDER — METOPROLOL SUCCINATE ER 25 MG PO TB24: 25.0000 mg | ORAL_TABLET | Freq: Every day | ORAL | 5 refills | Status: DC

## 2021-04-23 ENCOUNTER — Ambulatory Visit: Payer: BC Managed Care – PPO | Admitting: Nurse Practitioner

## 2021-05-26 DIAGNOSIS — E119 Type 2 diabetes mellitus without complications: Secondary | ICD-10-CM | POA: Diagnosis not present

## 2021-05-27 ENCOUNTER — Other Ambulatory Visit (HOSPITAL_COMMUNITY)
Admission: RE | Admit: 2021-05-27 | Discharge: 2021-05-27 | Disposition: A | Discharge status: Home / Self Care | Payer: BC Managed Care – PPO | Source: Ambulatory Visit | Attending: Nurse Practitioner | Admitting: Nurse Practitioner

## 2021-05-27 ENCOUNTER — Encounter: Payer: Self-pay | Admitting: Nurse Practitioner

## 2021-05-27 ENCOUNTER — Ambulatory Visit (INDEPENDENT_AMBULATORY_CARE_PROVIDER_SITE_OTHER): Payer: BC Managed Care – PPO | Admitting: Nurse Practitioner

## 2021-05-27 VITALS — BP 124/82 | Ht 62.0 in | Wt 159.0 lb

## 2021-05-27 DIAGNOSIS — N951 Menopausal and female climacteric states: Secondary | ICD-10-CM

## 2021-05-27 DIAGNOSIS — Z01419 Encounter for gynecological examination (general) (routine) without abnormal findings: Secondary | ICD-10-CM | POA: Diagnosis not present

## 2021-05-27 DIAGNOSIS — L900 Lichen sclerosus et atrophicus: Secondary | ICD-10-CM | POA: Diagnosis not present

## 2021-05-27 MED ORDER — CLOBETASOL PROPIONATE 0.05 % EX OINT: 1.0000 "application " | TOPICAL_OINTMENT | Freq: Two times a day (BID) | CUTANEOUS | 2 refills | Status: DC

## 2021-05-27 NOTE — Progress Notes (Signed)
? Dana Rojas [birth year] [id number] ? ? ?History:  51 y.o. G2P2 presents for annual exam. Perimenopausal. LMP in Novermber 2022. Normal pap history. HTN, a fib managed by cardiology.  ? ?Gynecologic History                                            ?Patient's last menstrual period was 12/24/2020. ?  ?Contraception/Family planning: tubal ligation ?Sexually active: Yes ? ?Health Maintenance ?Last Pap: 11/08/2014. Results were: Normal ?Last mammogram: 05/22/2019. Results were: Right breast distortion, biopsy showed chronic inflammation and fibrosis ?Last colonoscopy: Never ?Last Dexa: Never ? ?Past medical history, past surgical history, family history and social history were all reviewed and documented in the EPIC chart. Married. 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 62 yo son at [place], plans for sports PT. 74 yo daughter, junior in [employer], cheers and runs track.  ? ?ROS:  A ROS was performed and pertinent positives and negatives are included. ? ?Exam: ? ?Vitals:  ? 05/27/21 1353  ?BP: 124/82  ?Weight: 159 lb (72.1 kg)  ?Height: '5\' 2"'$  (1.575 m)  ? ?Body mass index is 29.08 kg/m?. ? ?General appearance:  Normal ?Thyroid:  Symmetrical, normal in size, without palpable masses or nodularity. ?Respiratory ? Auscultation:  Clear without wheezing or rhonchi ?Cardiovascular ? Auscultation:  Regular rate, without rubs, murmurs or gallops ? Edema/varicosities:  Not grossly evident ?Abdominal ? Soft,nontender, without masses, guarding or rebound. ? Liver/spleen:  No organomegaly noted ? Hernia:  None appreciated ? Skin ? Inspection:  Grossly normal ?Breasts: Examined lying and sitting.  ? Right: Without masses, retractions, nipple discharge or axillary adenopathy. ? ? Left: Without masses, retractions, nipple discharge or axillary adenopathy. ?Genitourinary  ? Inguinal/mons:  Normal without inguinal  adenopathy ? External genitalia:  Red, wrinkled appearance in figure 8 pattern consistent with LS ? BUS/Urethra/Skene's glands:  Normal ? Vagina:  Normal appearing with normal color and discharge, no lesions ? Cervix:  Normal appearing without discharge or lesions ? Uterus:  Normal in size, shape and contour.  Midline and mobile, nontender ? Adnexa/parametria:   ?  Rt: Normal in size, without masses or tenderness. ?  Lt: Normal in size, without masses or tenderness. ? Anus and perineum: Red, wrinkled appearance consistent with LS ? Digital rectal exam: Normal sphincter tone without palpated masses or tenderness ? ?Patient informed chaperone available to be present for breast and pelvic exam. Patient has requested no chaperone to be present. Patient has been advised what will be completed during breast and pelvic exam.  ? ?Assessment/Plan:  51 y.o. G2P2 for annual exam.  ? ?Well female exam with routine gynecological exam - Plan: Cytology - PAP( [facility]). Education provided on SBEs, importance of preventative screenings, current guidelines, high calcium diet, regular exercise, and multivitamin daily. Labs with PCP and cardiology.  ? ?Lichen sclerosus - Plan: clobetasol ointment (TEMOVATE) 0.05 % twice weekly. Initially she will apply daily x 7 days, then every other day x 7 days, then twice weekly.  ? ?Perimenopausal - LMP November 2022, minimal menopausal symptoms.  ? ?Screening for cervical cancer - Normal Pap history. Pap today.  ? ?Screening for breast cancer - Normal mammogram history.  Overdue. Discussed current guidelines and importance of preventative screenings. She plans to schedule now. Normal breast exam today. ? ?Screening for colon cancer - Has not had screening colonoscopy. She plans  to schedule this soon.  ? ?Screening for osteoporosis - Average risk. Will plan DXA at age 15.  ? ?Return in 1 year for annual.  ? ? ? ?Tamela Gammon DNP, 2:11 PM 05/27/2021 ? ?

## 2021-05-28 DIAGNOSIS — M67912 Unspecified disorder of synovium and tendon, left shoulder: Secondary | ICD-10-CM | POA: Diagnosis not present

## 2021-05-28 DIAGNOSIS — M67911 Unspecified disorder of synovium and tendon, right shoulder: Secondary | ICD-10-CM | POA: Diagnosis not present

## 2021-05-28 LAB — CYTOLOGY - PAP
Comment: NEGATIVE
Diagnosis: NEGATIVE
High risk HPV: NEGATIVE

## 2021-06-25 DIAGNOSIS — E119 Type 2 diabetes mellitus without complications: Secondary | ICD-10-CM | POA: Diagnosis not present

## 2021-06-25 DIAGNOSIS — Z713 Dietary counseling and surveillance: Secondary | ICD-10-CM | POA: Diagnosis not present

## 2021-07-05 ENCOUNTER — Other Ambulatory Visit: Payer: Self-pay | Admitting: Internal Medicine

## 2021-07-09 DIAGNOSIS — Z111 Encounter for screening for respiratory tuberculosis: Secondary | ICD-10-CM | POA: Diagnosis not present

## 2021-07-15 DIAGNOSIS — J029 Acute pharyngitis, unspecified: Secondary | ICD-10-CM | POA: Diagnosis not present

## 2021-07-15 DIAGNOSIS — J40 Bronchitis, not specified as acute or chronic: Secondary | ICD-10-CM | POA: Diagnosis not present

## 2021-07-15 DIAGNOSIS — J329 Chronic sinusitis, unspecified: Secondary | ICD-10-CM | POA: Diagnosis not present

## 2021-07-15 DIAGNOSIS — R059 Cough, unspecified: Secondary | ICD-10-CM | POA: Diagnosis not present

## 2021-08-18 DIAGNOSIS — K219 Gastro-esophageal reflux disease without esophagitis: Secondary | ICD-10-CM | POA: Diagnosis not present

## 2021-08-18 DIAGNOSIS — H903 Sensorineural hearing loss, bilateral: Secondary | ICD-10-CM | POA: Insufficient documentation

## 2021-08-18 DIAGNOSIS — H9313 Tinnitus, bilateral: Secondary | ICD-10-CM | POA: Insufficient documentation

## 2021-09-23 DIAGNOSIS — K5730 Diverticulosis of large intestine without perforation or abscess without bleeding: Secondary | ICD-10-CM | POA: Diagnosis not present

## 2021-09-23 DIAGNOSIS — K635 Polyp of colon: Secondary | ICD-10-CM | POA: Diagnosis not present

## 2021-09-23 DIAGNOSIS — Z1211 Encounter for screening for malignant neoplasm of colon: Secondary | ICD-10-CM | POA: Diagnosis not present

## 2021-09-23 LAB — HM COLONOSCOPY

## 2021-11-17 DIAGNOSIS — H109 Unspecified conjunctivitis: Secondary | ICD-10-CM | POA: Diagnosis not present

## 2021-11-20 ENCOUNTER — Ambulatory Visit
Admission: EM | Admit: 2021-11-20 | Discharge: 2021-11-20 | Disposition: A | Discharge status: Home / Self Care | Payer: BC Managed Care – PPO | Attending: Urgent Care | Admitting: Urgent Care

## 2021-11-20 DIAGNOSIS — B309 Viral conjunctivitis, unspecified: Secondary | ICD-10-CM | POA: Diagnosis not present

## 2021-11-20 DIAGNOSIS — J069 Acute upper respiratory infection, unspecified: Secondary | ICD-10-CM | POA: Diagnosis not present

## 2021-11-20 LAB — POCT RAPID STREP A (OFFICE): Rapid Strep A Screen: NEGATIVE

## 2021-11-20 MED ORDER — TOBRAMYCIN-DEXAMETHASONE 0.3-0.1 % OP SUSP: 1.0000 [drp] | Freq: Four times a day (QID) | OPHTHALMIC | 0 refills | Status: DC

## 2021-11-20 NOTE — ED Triage Notes (Signed)
Patient presents to UC for pink eye, cough, and HA since Monday. Bilateral ear pain and sore throat since yesterday. States she was seen by her PCP for pink eye affecting right eye and given ofloxacin. States yesterday redness noted to left eye. Treating symptoms with dayquil, nyquil, and iburprofen.

## 2021-11-20 NOTE — ED Provider Notes (Signed)
[facility]    CSN: [id number] Arrival date & time: 11/20/21  0935      History   Chief Complaint Chief Complaint  Patient presents with   Eye Problem   Sore Throat   Headache   Otalgia    HPI Dana Rojas is a 51 y.o. female.   51 year old female presents today due to concerns primarily of her eyes.  States end of last week she started developing sore throat, headache, and ear pain.  Also had a slight cough.  Had a fever at the initiation of symptoms as well.  By Monday, developed redness in her right eye.  Was seen elsewhere and was discharged home with ofloxacin drops.  Patient has been using the eyedrops as prescribed, but states that now the redness has moved to her left eye.  She denies any mucopurulent discharge.  She denies any drainage.  Patient is a contact lens wearer but has not worn any over the week.  Denies any pain, blurred vision, or swelling of eyelids.  She states all of her prior URI symptoms have resolved.  She did take a home COVID test which was negative.   Eye Problem Associated symptoms: headaches   Sore Throat Associated symptoms include headaches.  Headache Associated symptoms: ear pain   Otalgia Associated symptoms: headaches     Past Medical History:  Diagnosis Date   Allergy    Atrial fibrillation with tachycardic ventricular rate ([place]) 12/08/2019   Seasonal allergies    Vitamin D deficiency     Patient Active Problem List   Diagnosis Date Noted   Laryngopharyngeal reflux (LPR) 08/18/2021   Sensorineural hearing loss (SNHL), bilateral 08/18/2021   Tinnitus of both ears 08/18/2021   Family history of coronary artery disease 02/12/2020   Hypokalemia 12/09/2019   Gastroenteritis 12/09/2019   Hyperglycemia 12/09/2019   Atrial fibrillation with RVR ([place]) 12/09/2019   Troponin level elevated 12/09/2019   Flash pulmonary edema ([place])    New onset a-fib ([place]) 12/08/2019   Bradycardia, sinus 12/08/2019   Non-intractable  vomiting 12/08/2019   Sciatica of left side 08/05/2016   Hyperthyroidism 12/20/2014   Intramural leiomyoma of uterus 02/02/2014   Menorrhagia with regular cycle 02/02/2014   Hyperlipidemia 08/12/2009   OBESITY 08/12/2009   GESTATIONAL DIABETES 08/12/2009   SCOLIOSIS 08/12/2009   HEADACHE 08/12/2009   CHICKENPOX, HX OF 08/12/2009    Past Surgical History:  Procedure Laterality Date   HERNIA REPAIR     HERNIA REPAIR     X2   LEFT HEART CATH AND CORONARY ANGIOGRAPHY N/A 12/11/2019   Procedure: LEFT HEART CATH AND CORONARY ANGIOGRAPHY;  Surgeon: Lorretta Harp, MD;  Location: [facility];  Service: Cardiovascular;  Laterality: N/A;   TUBAL LIGATION      OB History     Gravida  2   Para  2   Term      Preterm      AB      Living  2      SAB      IAB      Ectopic      Multiple      Live Births               Home Medications    Prior to Admission medications   Medication Sig Start Date End Date Taking? Authorizing Provider  tobramycin-dexamethasone [facility]) ophthalmic solution Place 1 drop into both eyes every 6 (six) hours. 11/20/21  Yes Careem Yasui,  Ermin Parisien L, PA  albuterol (PROVENTIL HFA;VENTOLIN HFA) 108 (90 Base) MCG/ACT inhaler Inhale 2 puffs into the lungs every 4 (four) hours as needed for wheezing or shortness of breath (cough, shortness of breath or wheezing.). 04/29/16   Ivar Drape D, PA  aspirin EC 81 MG EC tablet Take 1 tablet (81 mg total) by mouth daily. Swallow whole. Patient not taking: Reported on 05/27/2021 12/12/19   Swayze, Ava, DO  cetirizine (ZYRTEC) 10 MG tablet Take 10 mg by mouth daily.    [provider]  clobetasol ointment (TEMOVATE) 1.60 % Apply 1 application. topically 2 (two) times daily. 05/27/21   Tamela Gammon, NP  EPINEPHrine 0.3 mg/0.3 mL IJ SOAJ injection Reason minister if you have any respiratory distress following an insect sting and call 911 Patient taking differently: Inject 0.3 mg into the  muscle as needed for anaphylaxis (see instructions.). Reason minister if you have any respiratory distress following an insect sting and call 911 09/01/14   Darlyne Russian, MD  fluticasone [facility]) 50 MCG/ACT nasal spray Place 2 sprays into both nostrils daily. Patient taking differently: Place 2 sprays into both nostrils daily as needed for allergies. 04/29/16   Ivar Drape D, PA  ipratropium (ATROVENT) 0.03 % nasal spray Place 2 sprays into the nose 2 (two) times daily. 08/17/11 03/03/30  [name], PA-C  metoprolol succinate (TOPROL-XL) 25 MG 24 hr tablet Take 1 tablet (25 mg total) by mouth daily. PATIENT MUST SCHEDULE APPOINTMENT FOR FUTURE REFILLS 07/07/21 07/07/22  Pixie Casino, MD    Family History Family History  Problem Relation Age of Onset   Heart disease Mother    Heart disease Father    Diabetes Paternal Grandfather    Thyroid disease Neg Hx     Social History Social History   Tobacco Use   Smoking status: Never   Smokeless tobacco: Never  Substance Use Topics   Alcohol use: No   Drug use: No     Allergies   Bee venom   Review of Systems Review of Systems  HENT:  Positive for ear pain.   Neurological:  Positive for headaches.   As per HPI  Physical Exam Triage Vital Signs ED Triage Vitals  Enc Vitals Group     BP 11/20/21 1044 130/82     Pulse Rate 11/20/21 1044 63     Resp 11/20/21 1044 16     Temp 11/20/21 1044 98 F (36.7 C)     Temp Source 11/20/21 1044 Oral     SpO2 11/20/21 1044 95 %     Weight --      Height --      Head Circumference --      Peak Flow --      Pain Score 11/20/21 1045 6     Pain Loc --      Pain Edu? --      Excl. in [place]? --    No data found.  Updated Vital Signs BP 130/82 (BP Location: Right Arm)   Pulse 63   Temp 98 F (36.7 C) (Oral)   Resp 16   SpO2 95%   Visual Acuity Right Eye Distance:   Left Eye Distance:   Bilateral Distance:    Right Eye Near:   Left Eye Near:    Bilateral Near:      Physical Exam Vitals and nursing note reviewed.  Constitutional:      Appearance: She is well-developed and normal weight. She is  not ill-appearing, toxic-appearing or diaphoretic.  HENT:     Head: Normocephalic and atraumatic.     Right Ear: Tympanic membrane and ear canal normal. No drainage, swelling or tenderness. No middle ear effusion. Tympanic membrane is not erythematous.     Left Ear: Tympanic membrane and ear canal normal. No drainage, swelling or tenderness.  No middle ear effusion. Tympanic membrane is not erythematous.     Nose: Congestion and rhinorrhea present.     Mouth/Throat:     Mouth: Mucous membranes are moist. No oral lesions.     Pharynx: Oropharynx is clear. No pharyngeal swelling, oropharyngeal exudate, posterior oropharyngeal erythema or uvula swelling.     Tonsils: No tonsillar exudate or tonsillar abscesses.  Eyes:     General: Lids are normal. Lids are everted, no foreign bodies appreciated. Vision grossly intact. Gaze aligned appropriately. No allergic shiner, visual field deficit or scleral icterus.       Right eye: No foreign body, discharge or hordeolum.        Left eye: No foreign body, discharge or hordeolum.     Extraocular Movements: Extraocular movements intact.     Right eye: Normal extraocular motion.     Left eye: Normal extraocular motion.     Conjunctiva/sclera:     Right eye: Right conjunctiva is injected. No chemosis, exudate or hemorrhage.    Left eye: Left conjunctiva is injected. No chemosis, exudate or hemorrhage.    Pupils: Pupils are equal, round, and reactive to light.     Visual Fields: Right eye visual fields normal and left eye visual fields normal.     Comments: No chemosis No hyphema No ciliary flush  Neck:     Thyroid: No thyromegaly.  Cardiovascular:     Rate and Rhythm: Normal rate.     Heart sounds: Normal heart sounds. No murmur heard. Pulmonary:     Effort: Pulmonary effort is normal. No respiratory distress.      Breath sounds: Normal breath sounds. No stridor. No wheezing, rhonchi or rales.  Chest:     Chest wall: No tenderness.  Abdominal:     General: Bowel sounds are normal. There is no distension.     Palpations: Abdomen is soft. There is no mass.     Tenderness: There is no abdominal tenderness. There is no guarding or rebound.     Hernia: No hernia is present.  Musculoskeletal:     Cervical back: Normal range of motion and neck supple.  Lymphadenopathy:     Cervical: No cervical adenopathy.  Skin:    General: Skin is warm.     Capillary Refill: Capillary refill takes less than 2 seconds.     Coloration: Skin is not pale.     Findings: No erythema or rash.  Neurological:     General: No focal deficit present.     Mental Status: She is alert and oriented to person, place, and time.  Psychiatric:        Mood and Affect: Mood normal.        Behavior: Behavior normal.      UC Treatments / Results  Labs (all labs ordered are listed, but only abnormal results are displayed) Labs Reviewed  POCT RAPID STREP A (OFFICE)    EKG   Radiology No results found.  Procedures Procedures (including critical care time)  Medications Ordered in UC Medications - No data to display  Initial Impression / Assessment and Plan / UC Course  I have reviewed the  triage vital signs and the nursing notes.  Pertinent labs & imaging results that were available during my care of the patient were reviewed by me and considered in my medical decision making (see chart for details).     Viral conjunctivitis B eyes -discussed the typical progression of viral conjunctivitis.  We will do 3 days worth of TobraDex, do not extend past day 3.  Warning signs discussed related to ocular inflammation. Viral upper respiratory infection -rapid strep negative.  Symptoms appear to be improving.  Monitor for symptom regression.  May consider using over-the-counter antihistamine to help clear up any drainage.   Final  Clinical Impressions(s) / UC Diagnoses   Final diagnoses:  Viral conjunctivitis of both eyes  Viral upper respiratory infection     Discharge Instructions      You have viral pinkeye, which is most commonly caused by Adenovirus. THIS IS HIGHLY CONTAGIOUS. Please avoid touching your eye; if you do, [place]! I have prescribed tobradex eye drops to help with ocular inflammation and prevent bacterial infection. Use this on both eyes one drop evey 6 hours for 3 days. DO NOT EXCEED 3 DAYS. Warm moist washcloths with Wynetta Emery and Johnson baby shampoo can be used to gently massage and cleans to eyes. Return to clinic if any fever, eye pain, change in vision.      ED Prescriptions     Medication Sig Dispense Auth. Provider   tobramycin-dexamethasone [facility]) ophthalmic solution Place 1 drop into both eyes every 6 (six) hours. 2.5 mL Corrine Tillis L, PA      PDMP not reviewed this encounter.   Chaney Malling, [place] 11/20/21 1900

## 2021-11-20 NOTE — Discharge Instructions (Addendum)
You have viral pinkeye, which is most commonly caused by Adenovirus. THIS IS HIGHLY CONTAGIOUS. Please avoid touching your eye; if you do, [place]! I have prescribed tobradex eye drops to help with ocular inflammation and prevent bacterial infection. Use this on both eyes one drop evey 6 hours for 3 days. DO NOT EXCEED 3 DAYS. Warm moist washcloths with Wynetta Emery and Johnson baby shampoo can be used to gently massage and cleans to eyes. Return to clinic if any fever, eye pain, change in vision.

## 2021-12-22 ENCOUNTER — Other Ambulatory Visit: Payer: Self-pay | Admitting: Internal Medicine

## 2022-01-13 ENCOUNTER — Telehealth: Payer: Self-pay | Admitting: *Deleted

## 2022-01-13 ENCOUNTER — Other Ambulatory Visit: Payer: Self-pay | Admitting: Nurse Practitioner

## 2022-01-13 DIAGNOSIS — B009 Herpesviral infection, unspecified: Secondary | ICD-10-CM

## 2022-01-13 MED ORDER — VALACYCLOVIR HCL 500 MG PO TABS: 500.0000 mg | ORAL_TABLET | Freq: Two times a day (BID) | ORAL | 1 refills | Status: DC

## 2022-01-13 NOTE — Telephone Encounter (Signed)
Patient called c/o hsv outbreak, needs Rx sent to pharmacy. It appears she used Valtrex 1,000 mg in past. [facility] in [employer].

## 2022-01-13 NOTE — Telephone Encounter (Signed)
Prescription for Valtrex 500 mg BID x 3-5 days provided with refills for future outbreaks. Thanks.

## 2022-02-04 ENCOUNTER — Other Ambulatory Visit: Payer: Self-pay | Admitting: Nurse Practitioner

## 2022-02-04 DIAGNOSIS — B009 Herpesviral infection, unspecified: Secondary | ICD-10-CM

## 2022-02-09 DIAGNOSIS — R3915 Urgency of urination: Secondary | ICD-10-CM | POA: Diagnosis not present

## 2022-02-09 DIAGNOSIS — Z6829 Body mass index (BMI) 29.0-29.9, adult: Secondary | ICD-10-CM | POA: Diagnosis not present

## 2022-02-09 DIAGNOSIS — R21 Rash and other nonspecific skin eruption: Secondary | ICD-10-CM | POA: Diagnosis not present

## 2022-02-13 DIAGNOSIS — R21 Rash and other nonspecific skin eruption: Secondary | ICD-10-CM | POA: Diagnosis not present

## 2022-02-20 DIAGNOSIS — J301 Allergic rhinitis due to pollen: Secondary | ICD-10-CM | POA: Diagnosis not present

## 2022-02-20 DIAGNOSIS — J3089 Other allergic rhinitis: Secondary | ICD-10-CM | POA: Diagnosis not present

## 2022-02-20 DIAGNOSIS — R062 Wheezing: Secondary | ICD-10-CM | POA: Diagnosis not present

## 2022-02-20 DIAGNOSIS — L501 Idiopathic urticaria: Secondary | ICD-10-CM | POA: Diagnosis not present

## 2022-03-07 ENCOUNTER — Other Ambulatory Visit: Payer: Self-pay | Admitting: Nurse Practitioner

## 2022-03-07 DIAGNOSIS — B009 Herpesviral infection, unspecified: Secondary | ICD-10-CM

## 2022-03-09 NOTE — Telephone Encounter (Signed)
Med refill request:Valtrex 500 mg tab; Take twice daily for 3-5 days at first sign of outbreak  Last AEX: 05/27/21 -TW Next AEX: Not scheduled  Rx refused.   Spoke to patient, refill not needed at this time.    Patient states unable to schedule [facility], needs order to [place].   Per review of EPIC, Bx of right breast 05/25/19, [place]. Recommendations: Patient was instructed to return for six month  follow up diagnostic mammogram.   Patient did not return.   Advised will complete order and fax. Instructed patient to contact [facility] to schedule.   Order to Jonelle Sidle, NP to review and sign.  Faxed to [phone].

## 2022-04-17 DIAGNOSIS — R922 Inconclusive mammogram: Secondary | ICD-10-CM | POA: Diagnosis not present

## 2022-04-17 DIAGNOSIS — R928 Other abnormal and inconclusive findings on diagnostic imaging of breast: Secondary | ICD-10-CM | POA: Diagnosis not present

## 2022-04-17 LAB — HM MAMMOGRAPHY

## 2022-05-01 DIAGNOSIS — M67911 Unspecified disorder of synovium and tendon, right shoulder: Secondary | ICD-10-CM | POA: Diagnosis not present

## 2022-08-03 ENCOUNTER — Ambulatory Visit: Payer: BC Managed Care – PPO | Admitting: Nurse Practitioner

## 2022-08-03 ENCOUNTER — Encounter: Payer: Self-pay | Admitting: Family Medicine

## 2022-08-03 ENCOUNTER — Ambulatory Visit: Payer: BC Managed Care – PPO | Admitting: Family Medicine

## 2022-08-03 ENCOUNTER — Telehealth: Payer: Self-pay | Admitting: Family Medicine

## 2022-08-03 VITALS — BP 126/82 | HR 78 | Temp 97.5°F | Ht 63.0 in | Wt 166.2 lb

## 2022-08-03 DIAGNOSIS — R42 Dizziness and giddiness: Secondary | ICD-10-CM | POA: Diagnosis not present

## 2022-08-03 DIAGNOSIS — E785 Hyperlipidemia, unspecified: Secondary | ICD-10-CM

## 2022-08-03 DIAGNOSIS — Z7984 Long term (current) use of oral hypoglycemic drugs: Secondary | ICD-10-CM

## 2022-08-03 DIAGNOSIS — I4891 Unspecified atrial fibrillation: Secondary | ICD-10-CM

## 2022-08-03 DIAGNOSIS — Z91038 Other insect allergy status: Secondary | ICD-10-CM

## 2022-08-03 DIAGNOSIS — E1165 Type 2 diabetes mellitus with hyperglycemia: Secondary | ICD-10-CM | POA: Diagnosis not present

## 2022-08-03 DIAGNOSIS — J302 Other seasonal allergic rhinitis: Secondary | ICD-10-CM

## 2022-08-03 LAB — COMPREHENSIVE METABOLIC PANEL
ALT: 30 U/L (ref 0–35)
AST: 24 U/L (ref 0–37)
Albumin: 4.4 g/dL (ref 3.5–5.2)
Alkaline Phosphatase: 89 U/L (ref 39–117)
BUN: 10 mg/dL (ref 6–23)
CO2: 26 mEq/L (ref 19–32)
Calcium: 9.1 mg/dL (ref 8.4–10.5)
Chloride: 100 mEq/L (ref 96–112)
Creatinine, Ser: 0.45 mg/dL (ref 0.40–1.20)
GFR: 110.77 mL/min (ref >60.00)
Glucose, Bld: 200 mg/dL — ABNORMAL HIGH (ref 70–99)
Potassium: 3.9 mEq/L (ref 3.5–5.1)
Sodium: 137 mEq/L (ref 135–145)
Total Bilirubin: 0.6 mg/dL (ref 0.2–1.2)
Total Protein: 7 g/dL (ref 6.0–8.3)

## 2022-08-03 LAB — CBC WITH DIFFERENTIAL/PLATELET
Basophils Absolute: 0 10*3/uL (ref 0.0–0.1)
Basophils Relative: 0.8 % (ref 0.0–3.0)
Eosinophils Absolute: 0.1 10*3/uL (ref 0.0–0.7)
Eosinophils Relative: 1.3 % (ref 0.0–5.0)
HCT: 40.2 % (ref 36.0–46.0)
Hemoglobin: 12.8 g/dL (ref 12.0–15.0)
Lymphocytes Relative: 33.3 % (ref 12.0–46.0)
Lymphs Abs: 1.8 10*3/uL (ref 0.7–4.0)
MCHC: 31.9 g/dL (ref 30.0–36.0)
MCV: 80.8 fl (ref 78.0–100.0)
Monocytes Absolute: 0.3 10*3/uL (ref 0.1–1.0)
Monocytes Relative: 5.5 % (ref 3.0–12.0)
Neutro Abs: 3.2 10*3/uL (ref 1.4–7.7)
Neutrophils Relative %: 59.1 % (ref 43.0–77.0)
Platelets: 268 10*3/uL (ref 150.0–400.0)
RBC: 4.98 Mil/uL (ref 3.87–5.11)
RDW: 13.9 % (ref 11.5–15.5)
WBC: 5.4 10*3/uL (ref 4.0–10.5)

## 2022-08-03 LAB — MICROALBUMIN / CREATININE URINE RATIO
Creatinine,U: 188.3 mg/dL
Microalb Creat Ratio: 1.6 mg/g (ref 0.0–30.0)
Microalb, Ur: 3.1 mg/dL — ABNORMAL HIGH (ref 0.0–1.9)

## 2022-08-03 LAB — URINALYSIS, ROUTINE W REFLEX MICROSCOPIC
Hgb urine dipstick: NEGATIVE
Leukocytes,Ua: NEGATIVE
Nitrite: NEGATIVE
Specific Gravity, Urine: >=1.03 — AB (ref 1.000–1.030)
Total Protein, Urine: NEGATIVE
Urine Glucose: 250 — AB
Urobilinogen, UA: 0.2 (ref 0.0–1.0)
pH: 5.5 (ref 5.0–8.0)

## 2022-08-03 LAB — LIPID PANEL
Cholesterol: 198 mg/dL (ref 0–200)
HDL: 45.5 mg/dL (ref >39.00)
NonHDL: 152.78
Total CHOL/HDL Ratio: 4
Triglycerides: 235 mg/dL — ABNORMAL HIGH (ref 0.0–149.0)
VLDL: 47 mg/dL — ABNORMAL HIGH (ref 0.0–40.0)

## 2022-08-03 LAB — POCT GLYCOSYLATED HEMOGLOBIN (HGB A1C): Hemoglobin A1C: 13.2 % — AB (ref 4.0–5.6)

## 2022-08-03 LAB — TSH: TSH: 0.36 u[IU]/mL (ref 0.35–5.50)

## 2022-08-03 LAB — LDL CHOLESTEROL, DIRECT: Direct LDL: 130 mg/dL

## 2022-08-03 MED ORDER — LANCET DEVICE MISC: 1.0000 | Freq: Three times a day (TID) | 0 refills | Status: AC

## 2022-08-03 MED ORDER — SCOPOLAMINE 1 MG/3DAYS TD PT72: 1.0000 | MEDICATED_PATCH | TRANSDERMAL | 12 refills | Status: DC

## 2022-08-03 MED ORDER — BLOOD GLUCOSE MONITORING SUPPL DEVI: 1.0000 | Freq: Three times a day (TID) | 0 refills | Status: AC

## 2022-08-03 MED ORDER — BLOOD GLUCOSE TEST VI STRP: 1.0000 | ORAL_STRIP | Freq: Three times a day (TID) | 0 refills | Status: DC

## 2022-08-03 MED ORDER — LANCETS MISC. MISC: 1.0000 | Freq: Three times a day (TID) | 0 refills | Status: AC

## 2022-08-03 MED ORDER — INSULIN GLARGINE-YFGN 100 UNIT/ML ~~LOC~~ SOPN: PEN_INJECTOR | SUBCUTANEOUS | 1 refills | Status: DC

## 2022-08-03 NOTE — Patient Instructions (Signed)
Start taking Metformin 500 mg tablets, two in the morning and two in the evening.  Begin using Glargine insulin at 20 units once a day.  Monitor your fasting blood sugars every morning. If the level is consistently above 130 for three days, increase your Glargine insulin dose by 1 unit.  Adhere to a low-carb diet and aim for 30 minutes of cardio exercise daily. Monitor your blood sugar levels regularly, especially during your upcoming cruise.

## 2022-08-03 NOTE — Telephone Encounter (Signed)
Pt said that her insurance insulin . Pt want to know if you can call another medication in to the pharmacy in the place of it

## 2022-08-03 NOTE — Progress Notes (Signed)
Assessment/Plan:   Problem List Items Addressed This Visit       Other   Hyperlipidemia   Relevant Medications   simvastatin (ZOCOR) 20 MG tablet   Other Relevant Orders   Lipid panel   Other Visit Diagnoses     Type 2 diabetes mellitus with hyperglycemia, without long-term current use of insulin (HCC)    -  Primary   Relevant Medications   simvastatin (ZOCOR) 20 MG tablet   metFORMIN (GLUCOPHAGE-XR) 500 MG 24 hr tablet   Blood Glucose Monitoring Suppl DEVI   Glucose Blood (BLOOD GLUCOSE TEST STRIPS) STRP   Lancet Device MISC   Lancets Misc. MISC   insulin glargine-yfgn (SEMGLEE) 100 UNIT/ML Pen   Other Relevant Orders   POCT glycosylated hemoglobin (Hb A1C) (Completed)   TSH   Hemoglobin A1c   Microalbumin / creatinine urine ratio   Urinalysis, Routine w reflex microscopic   CBC with Differential/Platelet   Comprehensive metabolic panel   Vertigo       Relevant Medications   scopolamine (TRANSDERM-SCOP) 1 MG/3DAYS   Other Relevant Orders   Vitamin D 1,25 dihydroxy   CBC with Differential/Platelet       Medications Discontinued During This Encounter  Medication Reason  . fluticasone (FLONASE) 50 MCG/ACT nasal spray   . valACYclovir (VALTREX) 500 MG tablet   . clobetasol ointment (TEMOVATE) 0.05 %   . ipratropium (ATROVENT) 0.03 % nasal spray   . ONETOUCH ULTRA test strip     Return in about 2 weeks (around 08/17/2022) for DM.    Subjective:   Encounter date: 08/03/2022  Dana Rojas is a 52 y.o. female who has Hyperlipidemia; OBESITY; GESTATIONAL DIABETES; SCOLIOSIS; HEADACHE; CHICKENPOX, HX OF; Intramural leiomyoma of uterus; Menorrhagia with regular cycle; Hyperthyroidism; Sciatica of left side; New onset a-fib (HCC); Bradycardia, sinus; Non-intractable vomiting; Hypokalemia; Gastroenteritis; Hyperglycemia; Atrial fibrillation with RVR (HCC); Troponin level elevated; Flash pulmonary edema (HCC); Family history of coronary artery disease;  Laryngopharyngeal reflux (LPR); Sensorineural hearing loss (SNHL), bilateral; and Tinnitus of both ears on their problem list..   She  has a past medical history of Allergy, Atrial fibrillation with tachycardic ventricular rate (HCC) (12/08/2019), Seasonal allergies, and Vitamin D deficiency..   She presents with chief complaint of Establish Care (Concerned about blood sugar. Patch for vertigo pt going on cruise. Rx refill epi pen. Fasting) .   HPI:   ROS  Past Surgical History:  Procedure Laterality Date  . HERNIA REPAIR    . HERNIA REPAIR     X2  . LEFT HEART CATH AND CORONARY ANGIOGRAPHY N/A 12/11/2019   Procedure: LEFT HEART CATH AND CORONARY ANGIOGRAPHY;  Surgeon: Runell Gess, MD;  Location: [facility];  Service: Cardiovascular;  Laterality: N/A;  . TUBAL LIGATION      Outpatient Medications Prior to Visit  Medication Sig Dispense Refill  . albuterol (PROVENTIL HFA;VENTOLIN HFA) 108 (90 Base) MCG/ACT inhaler Inhale 2 puffs into the lungs every 4 (four) hours as needed for wheezing or shortness of breath (cough, shortness of breath or wheezing.). 1 Inhaler 1  . cetirizine (ZYRTEC) 10 MG tablet Take 10 mg by mouth daily.    Marland Kitchen EPINEPHrine 0.3 mg/0.3 mL IJ SOAJ injection Reason minister if you have any respiratory distress following an insect sting and call 911 (Patient taking differently: Inject 0.3 mg into the muscle as needed for anaphylaxis (see instructions.). Reason minister if you have any respiratory distress following an insect sting and call 911) 1  Device 2  . levocetirizine (XYZAL) 5 MG tablet SMARTSIG:1 Tablet(s) By Mouth Every Evening    . metFORMIN (GLUCOPHAGE-XR) 500 MG 24 hr tablet Take 1,000 mg by mouth 2 (two) times daily with a meal.    . metoprolol succinate (TOPROL-XL) 25 MG 24 hr tablet TAKE 1 TABLET (25 MG TOTAL) BY MOUTH DAILY. PATIENT MUST SCHEDULE APPOINTMENT FOR FUTURE REFILLS 90 tablet 0  . simvastatin (ZOCOR) 20 MG tablet SMARTSIG:1 Tablet(s)  By Mouth Every Evening    . ONETOUCH ULTRA test strip USE TO CHECK YOUR BLOOD SUGAR FINGER STICK FOUR TIMES A DAY AS NEEDED. 25    . aspirin EC 81 MG EC tablet Take 1 tablet (81 mg total) by mouth daily. Swallow whole. (Patient not taking: Reported on 05/27/2021) 30 tablet 11  . tobramycin-dexamethasone (TOBRADEX) ophthalmic solution Place 1 drop into both eyes every 6 (six) hours. (Patient not taking: Reported on 08/03/2022) 2.5 mL 0  . clobetasol ointment (TEMOVATE) 0.05 % Apply 1 application. topically 2 (two) times daily. (Patient not taking: Reported on 08/03/2022) 60 g 2  . fluticasone (FLONASE) 50 MCG/ACT nasal spray Place 2 sprays into both nostrils daily. (Patient not taking: Reported on 08/03/2022) 16 g 12  . ipratropium (ATROVENT) 0.03 % nasal spray Place 2 sprays into the nose 2 (two) times daily. 30 mL 0  . valACYclovir (VALTREX) 500 MG tablet Take 1 tablet (500 mg total) by mouth 2 (two) times daily. Take twice daily for 3-5 days at first sign of outbreak (Patient not taking: Reported on 08/03/2022) 30 tablet 1   No facility-administered medications prior to visit.    Family History  Problem Relation Age of Onset  . Heart disease Mother   . Heart disease Father   . Diabetes Paternal Grandfather   . Thyroid disease Neg Hx     Social History   Socioeconomic History  . Marital status: Married    Spouse name: Not on file  . Number of children: Not on file  . Years of education: Not on file  . Highest education level: Not on file  Occupational History  . Not on file  Tobacco Use  . Smoking status: Never    Passive exposure: Never  . Smokeless tobacco: Never  Vaping Use  . Vaping Use: Never used  Substance and Sexual Activity  . Alcohol use: No  . Drug use: No  . Sexual activity: Yes    Partners: Male    Birth control/protection: Surgical    Comment: tubal ligation   Other Topics Concern  . Not on file  Social History Narrative  . Not on file   Social Determinants of  Health   Financial Resource Strain: Not on file  Food Insecurity: Not on file  Transportation Needs: Not on file  Physical Activity: Not on file  Stress: Not on file  Social Connections: Not on file  Intimate Partner Violence: Not on file                                                                                                  Objective:  Physical Exam: BP 126/82 (BP Location: Left Arm, Patient Position: Sitting, Cuff Size: Large)   Pulse 78   Temp (!) 97.5 F (36.4 C) (Temporal)   Ht 5\' 3"  (1.6 m)   Wt 166 lb 3.2 oz (75.4 kg)   SpO2 99%   BMI 29.44 kg/m     Physical Exam  No results found.  Recent Results (from the past 2160 hour(s))  POCT glycosylated hemoglobin (Hb A1C)     Status: Abnormal   Collection Time: 08/03/22 10:38 AM  Result Value Ref Range   Hemoglobin A1C 13.2 (A) 4.0 - 5.6 %   HbA1c POC (<> result, manual entry)     HbA1c, POC (prediabetic range)     HbA1c, POC (controlled diabetic range)          Garner Nash, MD, MS

## 2022-08-03 NOTE — Telephone Encounter (Signed)
Please advise, see below.    Pt stated that her insurance will not cover the insulin glargine-yfgn (SEMGLEE) 100 UNIT/ML Pen  that was sent in.   Contacted the pharmacy for more clarity. They stated that they needed a max daily dose on rx and to resend rx to pharmacy and patient should be able to pick up the rx.

## 2022-08-04 LAB — HEMOGLOBIN A1C: Hgb A1c MFr Bld: 13.9 % — ABNORMAL HIGH (ref 4.6–6.5)

## 2022-08-05 ENCOUNTER — Telehealth: Payer: Self-pay | Admitting: Family Medicine

## 2022-08-05 ENCOUNTER — Other Ambulatory Visit: Payer: Self-pay | Admitting: Family Medicine

## 2022-08-05 DIAGNOSIS — E1165 Type 2 diabetes mellitus with hyperglycemia: Secondary | ICD-10-CM

## 2022-08-05 DIAGNOSIS — Z91038 Other insect allergy status: Secondary | ICD-10-CM | POA: Insufficient documentation

## 2022-08-05 DIAGNOSIS — R42 Dizziness and giddiness: Secondary | ICD-10-CM | POA: Insufficient documentation

## 2022-08-05 MED ORDER — ROSUVASTATIN CALCIUM 20 MG PO TABS: 20.0000 mg | ORAL_TABLET | Freq: Every day | ORAL | 0 refills | Status: DC

## 2022-08-05 MED ORDER — INSULIN GLARGINE-YFGN 100 UNIT/ML ~~LOC~~ SOPN: PEN_INJECTOR | SUBCUTANEOUS | 1 refills | Status: DC

## 2022-08-05 MED ORDER — PEN NEEDLES 31G X 5 MM MISC: 1.0000 | Freq: Three times a day (TID) | 0 refills | Status: DC

## 2022-08-05 MED ORDER — EPINEPHRINE 0.3 MG/0.3ML IJ SOAJ: INTRAMUSCULAR | 2 refills | Status: AC

## 2022-08-05 NOTE — Assessment & Plan Note (Addendum)
Rate controlled with metoprolol?  Patient denies this, but chart review seems to affirm this.. Repeat EKG if any new symptoms of palpitations or chest discomfort arise.

## 2022-08-05 NOTE — Addendum Note (Signed)
Addended by: Garnette Gunner on: 08/05/2022 04:43 PM   Modules accepted: Orders

## 2022-08-05 NOTE — Assessment & Plan Note (Signed)
Increase Metformin Dose: Increase from 500 mg 2x/day to 1000 mg 2x/day (2000 mg total daily). Start glargine (Lantus) insulin at 20 units once daily, monitoring fasting glucose levels. Increase by 1 unit every three days if fasting glucose levels remain above 130 mg/dL. Ensure the patient has adequate testing supplies and check fasting glucose levels daily.  Emphasize strict glycemic control to prevent complications, discuss dietary modifications and regular physical activity. Schedule follow-up in 2 weeks to review blood glucose logs and adjust insulin dosage as needed. Labs: Comprehensive metabolic panel, lipid panel, hemoglobin A1C, and urine microalbumin. Referral to a diabetes educator for additional support and education.

## 2022-08-05 NOTE — Telephone Encounter (Signed)
Please advise, see below.  Pt is calling back in for status of medication

## 2022-08-05 NOTE — Telephone Encounter (Signed)
Pt went to the pharmacy to get insulin and pins and the pharmacy said they are still waiting on prior authorization. And pt want to know will it be covered under her insurance

## 2022-08-05 NOTE — Assessment & Plan Note (Signed)
Review lipid panel and possibly adjust current lipid-lowering therapy. Emphasis on low-fat, high-fiber diet.

## 2022-08-05 NOTE — Telephone Encounter (Signed)
Updated on separate encounter.

## 2022-08-06 LAB — VITAMIN D 1,25 DIHYDROXY
Vitamin D 1, 25 (OH)2 Total: 48 pg/mL (ref 18–72)
Vitamin D2 1, 25 (OH)2: <8 pg/mL
Vitamin D3 1, 25 (OH)2: 48 pg/mL

## 2022-08-17 ENCOUNTER — Ambulatory Visit: Payer: BC Managed Care – PPO | Admitting: Family Medicine

## 2022-08-17 ENCOUNTER — Encounter: Payer: Self-pay | Admitting: Family Medicine

## 2022-08-17 VITALS — BP 128/82 | HR 78 | Temp 97.7°F | Wt 170.2 lb

## 2022-08-17 DIAGNOSIS — Z794 Long term (current) use of insulin: Secondary | ICD-10-CM | POA: Diagnosis not present

## 2022-08-17 DIAGNOSIS — E1165 Type 2 diabetes mellitus with hyperglycemia: Secondary | ICD-10-CM | POA: Diagnosis not present

## 2022-08-17 DIAGNOSIS — E785 Hyperlipidemia, unspecified: Secondary | ICD-10-CM | POA: Diagnosis not present

## 2022-08-17 DIAGNOSIS — Z7984 Long term (current) use of oral hypoglycemic drugs: Secondary | ICD-10-CM | POA: Diagnosis not present

## 2022-08-17 MED ORDER — METFORMIN HCL ER 500 MG PO TB24: 1000.0000 mg | ORAL_TABLET | Freq: Two times a day (BID) | ORAL | 0 refills | Status: DC

## 2022-08-17 MED ORDER — LANTUS SOLOSTAR 100 UNIT/ML ~~LOC~~ SOPN: 20.0000 [IU] | PEN_INJECTOR | Freq: Every day | SUBCUTANEOUS | 0 refills | Status: DC

## 2022-08-17 NOTE — Assessment & Plan Note (Signed)
Improvement noted with current regimen.  Tolerating well without side effects.  Plan: Continue Metformin ER 10000mg  BID Maintain Lantus: 20 units daily Recheck A1C: in 3 months to evaluate long-term control.

## 2022-08-17 NOTE — Progress Notes (Signed)
Assessment/Plan:   Problem List Items Addressed This Visit       Endocrine   Type 2 diabetes mellitus with hyperglycemia, without long-term current use of insulin (HCC)    Improvement noted with current regimen.  Tolerating well without side effects.  Plan: Continue Metformin ER 10000mg  BID Maintain Lantus: 20 units daily Recheck A1C: in 3 months to evaluate long-term control.      Relevant Medications   insulin glargine (LANTUS SOLOSTAR) 100 UNIT/ML Solostar Pen   metFORMIN (GLUCOPHAGE-XR) 500 MG 24 hr tablet   Other Relevant Orders   Lipid panel   Hemoglobin A1c   Comprehensive metabolic panel     Other   Hyperlipidemia - Primary    Continue Rosuvastatin 20 mg daily. Monitor Lipid Profile: in 3 months along with other lab work. Dietary Counseling: Emphasis on low-fat, high-fiber diet and exercise as adjuncts to pharmacotherapy.      Relevant Orders   Lipid panel   Hemoglobin A1c   Comprehensive metabolic panel    Medications Discontinued During This Encounter  Medication Reason   metFORMIN (GLUCOPHAGE-XR) 500 MG 24 hr tablet Reorder   insulin glargine (LANTUS SOLOSTAR) 100 UNIT/ML Solostar Pen Reorder    Return in about 3 months (around 11/17/2022) for DM, HLD.    Subjective:   Encounter date: 08/17/2022  Dana Rojas is a 52 y.o. female who has Hyperlipidemia; OBESITY; GESTATIONAL DIABETES; SCOLIOSIS; HEADACHE; CHICKENPOX, HX OF; Intramural leiomyoma of uterus; Menorrhagia with regular cycle; Hyperthyroidism; Sciatica of left side; New onset a-fib (HCC); Bradycardia, sinus; Non-intractable vomiting; Hypokalemia; Gastroenteritis; Hyperglycemia; Atrial fibrillation with RVR (HCC); Troponin level elevated; Flash pulmonary edema (HCC); Family history of coronary artery disease; Laryngopharyngeal reflux (LPR); Sensorineural hearing loss (SNHL), bilateral; Tinnitus of both ears; Vertigo; Type 2 diabetes mellitus with hyperglycemia, without long-term current use  of insulin (HCC); and Hymenoptera allergy on their problem list..   She  has a past medical history of Allergy, Atrial fibrillation with tachycardic ventricular rate (HCC) (12/08/2019), Seasonal allergies, and Vitamin D deficiency..   She presents with chief complaint of Medical Management of Chronic Issues (Dm follow up. Fasting) .   HPI:   Diabetes Mellitus Type 2: The patient has been experiencing elevated blood sugar levels and was started on a regimen including Metformin and Lantus. Their Metformin dose was increased from 500 mg BID to 1000 mg BID. Lantus was initiated at 20 units QHS. The patient reported a significant decrease in blood sugar readings, from an initial A1C of 13% to fasting glucose levels around 100-120 mg/dL. They deny any incidents of hypoglycemia and no notable side effects were reported.  Hypercholesterolemia: The patient's cholesterol management was switched from Simvastatin to Rosuvastatin due to persistently elevated cholesterol despite treatment.  Patient tolerating well without myalgias.  Review of Systems  Constitutional:  Negative for chills, diaphoresis, fever, malaise/fatigue and weight loss.  HENT:  Negative for congestion, ear discharge, ear pain and hearing loss.   Eyes:  Negative for blurred vision, double vision, photophobia, pain, discharge and redness.  Respiratory:  Negative for cough, sputum production, shortness of breath and wheezing.   Cardiovascular:  Negative for chest pain and palpitations. Leg swelling: mild right leg. Gastrointestinal:  Negative for abdominal pain, blood in stool, constipation, diarrhea, heartburn, melena, nausea and vomiting.  Genitourinary:  Positive for frequency (Improved). Negative for dysuria, flank pain, hematuria and urgency.  Musculoskeletal:  Negative for myalgias.  Skin:  Negative for itching and rash.  Neurological:  Negative for dizziness, tingling, tremors,  speech change, seizures, loss of consciousness,  weakness and headaches.  Endo/Heme/Allergies:  Positive for polydipsia (Improved).  Psychiatric/Behavioral:  Negative for depression, hallucinations, memory loss, substance abuse and suicidal ideas. The patient is not nervous/anxious and does not have insomnia.   All other systems reviewed and are negative.   Past Surgical History:  Procedure Laterality Date   HERNIA REPAIR     HERNIA REPAIR     X2   LEFT HEART CATH AND CORONARY ANGIOGRAPHY N/A 12/11/2019   Procedure: LEFT HEART CATH AND CORONARY ANGIOGRAPHY;  Surgeon: Runell Gess, MD;  Location: [facility];  Service: Cardiovascular;  Laterality: N/A;   TUBAL LIGATION      Outpatient Medications Prior to Visit  Medication Sig Dispense Refill   albuterol (PROVENTIL HFA;VENTOLIN HFA) 108 (90 Base) MCG/ACT inhaler Inhale 2 puffs into the lungs every 4 (four) hours as needed for wheezing or shortness of breath (cough, shortness of breath or wheezing.). 1 Inhaler 1   Blood Glucose Monitoring Suppl DEVI 1 each by Does not apply route in the morning, at noon, and at bedtime. May substitute to any manufacturer covered by patient's insurance. 1 each 0   cetirizine (ZYRTEC) 10 MG tablet Take 10 mg by mouth daily.     EPINEPHrine 0.3 mg/0.3 mL IJ SOAJ injection Reason minister if you have any respiratory distress following an insect sting and call 911 1 each 2   Glucose Blood (BLOOD GLUCOSE TEST STRIPS) STRP 1 each by In Vitro route in the morning, at noon, and at bedtime. May substitute to any manufacturer covered by patient's insurance. 100 strip 0   Insulin Pen Needle (PEN NEEDLES) 31G X 5 MM MISC 1 each by Does not apply route 3 (three) times daily with meals. May substitute to any manufacturer covered by patient's insurance. 100 each 0   Lancet Device MISC 1 each by Does not apply route in the morning, at noon, and at bedtime. May substitute to any manufacturer covered by patient's insurance. 1 each 0   Lancets Misc. MISC 1 each by  Does not apply route in the morning, at noon, and at bedtime. May substitute to any manufacturer covered by patient's insurance. 100 each 0   metoprolol succinate (TOPROL-XL) 25 MG 24 hr tablet TAKE 1 TABLET (25 MG TOTAL) BY MOUTH DAILY. PATIENT MUST SCHEDULE APPOINTMENT FOR FUTURE REFILLS 90 tablet 0   rosuvastatin (CRESTOR) 20 MG tablet Take 1 tablet (20 mg total) by mouth daily. 90 tablet 0   scopolamine (TRANSDERM-SCOP) 1 MG/3DAYS Place 1 patch (1.5 mg total) onto the skin every 3 (three) days. 10 patch 12   insulin glargine (LANTUS SOLOSTAR) 100 UNIT/ML Solostar Pen START WITH 20 UNITS AT NIGHT. FOR EVERY 3 DAYS THAT FASTING BLOOD SUGARS ARE GREATER THAN 130, INCREASE BY INJECTION BY 1 UNIT. MAX DOSE 30 15 mL 1   metFORMIN (GLUCOPHAGE-XR) 500 MG 24 hr tablet Take 1,000 mg by mouth 2 (two) times daily with a meal.     aspirin EC 81 MG EC tablet Take 1 tablet (81 mg total) by mouth daily. Swallow whole. (Patient not taking: Reported on 05/27/2021) 30 tablet 11   levocetirizine (XYZAL) 5 MG tablet SMARTSIG:1 Tablet(s) By Mouth Every Evening (Patient not taking: Reported on 08/17/2022)     tobramycin-dexamethasone (TOBRADEX) ophthalmic solution Place 1 drop into both eyes every 6 (six) hours. (Patient not taking: Reported on 08/03/2022) 2.5 mL 0   No facility-administered medications prior to visit.    Family  History  Problem Relation Age of Onset   Heart disease Mother    Heart disease Father    Diabetes Paternal Grandfather    Thyroid disease Neg Hx     Social History   Socioeconomic History   Marital status: Married    Spouse name: Not on file   Number of children: Not on file   Years of education: Not on file   Highest education level: Not on file  Occupational History   Not on file  Tobacco Use   Smoking status: Never    Passive exposure: Never   Smokeless tobacco: Never  Vaping Use   Vaping Use: Never used  Substance and Sexual Activity   Alcohol use: No   Drug use: No    Sexual activity: Yes    Partners: Male    Birth control/protection: Surgical    Comment: tubal ligation   Other Topics Concern   Not on file  Social History Narrative   Not on file   Social Determinants of Health   Financial Resource Strain: Not on file  Food Insecurity: Not on file  Transportation Needs: Not on file  Physical Activity: Not on file  Stress: Not on file  Social Connections: Not on file  Intimate Partner Violence: Not on file                                                                                                  Objective:  Physical Exam: BP 128/82 (BP Location: Left Arm, Patient Position: Sitting, Cuff Size: Large)   Pulse 78   Temp 97.7 F (36.5 C) (Temporal)   Wt 170 lb 3.2 oz (77.2 kg)   SpO2 98%   BMI 30.15 kg/m   Wt Readings from Last 3 Encounters:  08/17/22 170 lb 3.2 oz (77.2 kg)  08/03/22 166 lb 3.2 oz (75.4 kg)  05/27/21 159 lb (72.1 kg)     Physical Exam Constitutional:      General: She is not in acute distress.    Appearance: Normal appearance. She is not ill-appearing or toxic-appearing.  HENT:     Head: Normocephalic and atraumatic.     Nose: Nose normal. No congestion.  Eyes:     General: No scleral icterus.    Extraocular Movements: Extraocular movements intact.  Cardiovascular:     Rate and Rhythm: Normal rate and regular rhythm.     Pulses: Normal pulses.     Heart sounds: Normal heart sounds.  Pulmonary:     Effort: Pulmonary effort is normal. No respiratory distress.     Breath sounds: Normal breath sounds.  Abdominal:     General: Abdomen is flat. Bowel sounds are normal.     Palpations: Abdomen is soft.  Musculoskeletal:        General: Normal range of motion.  Lymphadenopathy:     Cervical: No cervical adenopathy.  Skin:    General: Skin is warm and dry.     Findings: No rash.  Neurological:     General: No focal deficit present.     Mental Status: She is alert and  oriented to person, place, and time.  Mental status is at baseline.  Psychiatric:        Mood and Affect: Mood normal.        Behavior: Behavior normal.        Thought Content: Thought content normal.        Judgment: Judgment normal.     No results found.  Recent Results (from the past 2160 hour(s))  POCT glycosylated hemoglobin (Hb A1C)     Status: Abnormal   Collection Time: 08/03/22 10:38 AM  Result Value Ref Range   Hemoglobin A1C 13.2 (A) 4.0 - 5.6 %   HbA1c POC (<> result, manual entry)     HbA1c, POC (prediabetic range)     HbA1c, POC (controlled diabetic range)    Microalbumin / creatinine urine ratio     Status: Abnormal   Collection Time: 08/03/22 11:15 AM  Result Value Ref Range   Microalb, Ur 3.1 (H) 0.0 - 1.9 mg/dL   Creatinine,U 161.0 mg/dL   Microalb Creat Ratio 1.6 0.0 - 30.0 mg/g  Urinalysis, Routine w reflex microscopic     Status: Abnormal   Collection Time: 08/03/22 11:15 AM  Result Value Ref Range   Color, Urine YELLOW Yellow;Lt. Yellow;Straw;Dark Yellow;Amber;Green;Red;Brown   APPearance Cloudy (A) Clear;Turbid;Slightly Cloudy;Cloudy   Specific Gravity, Urine >=1.030 (A) 1.000 - 1.030   pH 5.5 5.0 - 8.0   Total Protein, Urine NEGATIVE Negative   Urine Glucose 250 (A) Negative   Ketones, ur TRACE (A) Negative   Bilirubin Urine SMALL (A) Negative   Hgb urine dipstick NEGATIVE Negative   Urobilinogen, UA 0.2 0.0 - 1.0   Leukocytes,Ua NEGATIVE Negative   Nitrite NEGATIVE Negative   WBC, UA 11-20/hpf (A) 0-2/hpf   RBC / HPF 0-2/hpf 0-2/hpf   Mucus, UA Presence of (A) None   Squamous Epithelial / HPF Many(>10/hpf) (A) Rare(0-4/hpf)   Bacteria, UA Few(10-50/hpf) (A) None   Amorphous Present (A) None;Present  TSH     Status: None   Collection Time: 08/03/22 11:19 AM  Result Value Ref Range   TSH 0.36 0.35 - 5.50 uIU/mL  Lipid panel     Status: Abnormal   Collection Time: 08/03/22 11:19 AM  Result Value Ref Range   Cholesterol 198 0 - 200 mg/dL    Comment: ATP III Classification        Desirable:  < 200 mg/dL               Borderline High:  200 - 239 mg/dL          High:  > = 960 mg/dL   Triglycerides 454.0 (H) 0.0 - 149.0 mg/dL    Comment: Normal:  <981 mg/dLBorderline High:  150 - 199 mg/dL   HDL 19.14 >78.29 mg/dL   VLDL 56.2 (H) 0.0 - 13.0 mg/dL   Total CHOL/HDL Ratio 4     Comment:                Men          Women1/2 Average Risk     3.4          3.3Average Risk          5.0          4.42X Average Risk          9.6          7.13X Average Risk          15.0  11.0                       NonHDL 152.78     Comment: NOTE:  Non-HDL goal should be 30 mg/dL higher than patient's LDL goal (i.e. LDL goal of < 70 mg/dL, would have non-HDL goal of < 100 mg/dL)  Hemoglobin W0J     Status: Abnormal   Collection Time: 08/03/22 11:19 AM  Result Value Ref Range   Hgb A1c MFr Bld 13.9 (H) 4.6 - 6.5 %    Comment: Glycemic Control Guidelines for People with Diabetes:Non Diabetic:  <6%Goal of Therapy: <7%Additional Action Suggested:  >8%   Vitamin D 1,25 dihydroxy     Status: None   Collection Time: 08/03/22 11:19 AM  Result Value Ref Range   Vitamin D 1, 25 (OH)2 Total 48 18 - 72 pg/mL   Vitamin D3 1, 25 (OH)2 48 pg/mL   Vitamin D2 1, 25 (OH)2 <8 pg/mL    Comment: (Note) Vitamin D3, 1,25(OH)2 indicates both endogenous  production and supplementation. Vitamin D2, 1,25(OH)2 is  an indicator of exogenous sources, such as diet or  supplementation. Interpretation and therapy are based on  measurement of Vitamin D, 1,25 (OH)2, Total. . This test was developed, and its analytical performance  characteristics have been determined by [employer]. It has not been cleared or approved by the  FDA. This assay has been validated pursuant to the CLIA  regulations and is used for clinical purposes. . For additional information, please refer to http://education.[facility].com/faq/FAQ199 (This link is being provided for  informational/educational purposes  only.) . [facility] [address] [place] [address] [phone] Junita Push L. Branston Halsted Caul, MD, PhD   CBC with Differential/Platelet     Status: None   Collection Time: 08/03/22 11:19 AM  Result Value Ref Range   WBC 5.4 4.0 - 10.5 K/uL   RBC 4.98 3.87 - 5.11 Mil/uL   Hemoglobin 12.8 12.0 - 15.0 g/dL   HCT 08.6 57.8 - 46.9 %   MCV 80.8 78.0 - 100.0 fl   MCHC 31.9 30.0 - 36.0 g/dL   RDW 62.9 52.8 - 41.3 %   Platelets 268.0 150.0 - 400.0 K/uL   Neutrophils Relative % 59.1 43.0 - 77.0 %   Lymphocytes Relative 33.3 12.0 - 46.0 %   Monocytes Relative 5.5 3.0 - 12.0 %   Eosinophils Relative 1.3 0.0 - 5.0 %   Basophils Relative 0.8 0.0 - 3.0 %   Neutro Abs 3.2 1.4 - 7.7 K/uL   Lymphs Abs 1.8 0.7 - 4.0 K/uL   Monocytes Absolute 0.3 0.1 - 1.0 K/uL   Eosinophils Absolute 0.1 0.0 - 0.7 K/uL   Basophils Absolute 0.0 0.0 - 0.1 K/uL  Comprehensive metabolic panel     Status: Abnormal   Collection Time: 08/03/22 11:19 AM  Result Value Ref Range   Sodium 137 135 - 145 mEq/L   Potassium 3.9 3.5 - 5.1 mEq/L   Chloride 100 96 - 112 mEq/L   CO2 26 19 - 32 mEq/L   Glucose, Bld 200 (H) 70 - 99 mg/dL   BUN 10 6 - 23 mg/dL   Creatinine, Ser 2.44 0.40 - 1.20 mg/dL   Total Bilirubin 0.6 0.2 - 1.2 mg/dL   Alkaline Phosphatase 89 39 - 117 U/L   AST 24 0 - 37 U/L   ALT 30 0 - 35 U/L   Total Protein 7.0 6.0 - 8.3 g/dL  Albumin 4.4 3.5 - 5.2 g/dL   GFR 578.46 >96.29 mL/min    Comment: Calculated using the CKD-EPI Creatinine Equation (2021)   Calcium 9.1 8.4 - 10.5 mg/dL  LDL cholesterol, direct     Status: None   Collection Time: 08/03/22 11:19 AM  Result Value Ref Range   Direct LDL 130.0 mg/dL    Comment: Optimal:  <528 mg/dLNear or Above Optimal:  100-129 mg/dLBorderline High:  130-159 mg/dLHigh:  160-189 mg/dLVery High:  >190 mg/dL        Garner Nash, MD, MS

## 2022-08-17 NOTE — Patient Instructions (Signed)
Continue taking Lantus at 20 units every night. Your blood sugar levels are improving, so no changes are needed. Maintain your metformin with the 500 mg tablets at 1000 mg (2 pills) twice a day. The 1000 mg tablets were going to be > $200 month.  Take your rosuvastatin as prescribed to manage your cholesterol levels. Monitor your blood sugar levels regularly and report any significant changes or side effects, such as low blood sugar episodes. S

## 2022-08-17 NOTE — Assessment & Plan Note (Signed)
Continue Rosuvastatin 20 mg daily. Monitor Lipid Profile: in 3 months along with other lab work. Dietary Counseling: Emphasis on low-fat, high-fiber diet and exercise as adjuncts to pharmacotherapy.

## 2022-09-03 ENCOUNTER — Other Ambulatory Visit: Payer: Self-pay | Admitting: Family Medicine

## 2022-09-03 DIAGNOSIS — E1165 Type 2 diabetes mellitus with hyperglycemia: Secondary | ICD-10-CM

## 2022-09-12 ENCOUNTER — Other Ambulatory Visit: Payer: Self-pay | Admitting: Family Medicine

## 2022-09-12 DIAGNOSIS — E1165 Type 2 diabetes mellitus with hyperglycemia: Secondary | ICD-10-CM

## 2022-10-20 ENCOUNTER — Ambulatory Visit (INDEPENDENT_AMBULATORY_CARE_PROVIDER_SITE_OTHER): Payer: BC Managed Care – PPO | Admitting: Nurse Practitioner

## 2022-10-20 ENCOUNTER — Encounter: Payer: Self-pay | Admitting: Nurse Practitioner

## 2022-10-20 VITALS — BP 110/70 | HR 69 | Ht 62.0 in | Wt 179.0 lb

## 2022-10-20 DIAGNOSIS — N941 Unspecified dyspareunia: Secondary | ICD-10-CM

## 2022-10-20 DIAGNOSIS — L900 Lichen sclerosus et atrophicus: Secondary | ICD-10-CM | POA: Diagnosis not present

## 2022-10-20 DIAGNOSIS — Z780 Asymptomatic menopausal state: Secondary | ICD-10-CM | POA: Diagnosis not present

## 2022-10-20 DIAGNOSIS — Z01419 Encounter for gynecological examination (general) (routine) without abnormal findings: Secondary | ICD-10-CM | POA: Diagnosis not present

## 2022-10-20 NOTE — Progress Notes (Signed)
Dana Rojas 1970-11-15 [id number]   History:  52 y.o. G2P2 presents for annual exam. Postmenopausal - no HRT. Normal pap history. HTN, a fib managed by cardiology. T2DM managed by PCP. Painful intercourse.   Gynecologic History                                            Patient's last menstrual period was 12/24/2020.   Contraception/Family planning: post menopausal status and tubal ligation Sexually active: Yes  Health Maintenance Last Pap: 05/27/2021. Results were: Normal neg HPV, 5-year repeat Last mammogram: 04/17/2022. Results were: Normal Last colonoscopy: 09/23/2021. Results were: Normal, 10-year recall Last Dexa: Never  Past medical history, past surgical history, family history and social history were all reviewed and documented in the EPIC chart. Married. 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 65 yo son at [employer], plans for sports PT. 66 yo daughter, freshman at [employer] for elem ed, transferring to [employer] in January.   ROS:  A ROS was performed and pertinent positives and negatives are included.  Exam:  Vitals:   10/20/22 1554  BP: 110/70  Pulse: 69  SpO2: 98%  Weight: 179 lb (81.2 kg)  Height: 5\' 2"  (1.575 m)    Body mass index is 32.74 kg/m.  General appearance:  Normal Thyroid:  Symmetrical, normal in size, without palpable masses or nodularity. Respiratory  Auscultation:  Clear without wheezing or rhonchi Cardiovascular  Auscultation:  Regular rate, without rubs, murmurs or gallops  Edema/varicosities:  Not grossly evident Abdominal  Soft,nontender, without masses, guarding or rebound.  Liver/spleen:  No organomegaly noted  Hernia:  None appreciated  Skin  Inspection:  Grossly normal Breasts: Examined lying and sitting.   Right: Without masses, retractions, nipple discharge or axillary adenopathy.   Left: Without masses, retractions, nipple discharge  or axillary adenopathy. Genitourinary   Inguinal/mons:  Normal without inguinal adenopathy  External genitalia:  Red, wrinkled appearance in figure 8 pattern consistent with LS  BUS/Urethra/Skene's glands:  Normal  Vagina:  Normal appearing with normal color and discharge, no lesions  Cervix:  Normal appearing without discharge or lesions  Uterus:  Normal in size, shape and contour.  Midline and mobile, nontender  Adnexa/parametria:     Rt: Normal in size, without masses or tenderness.   Lt: Normal in size, without masses or tenderness.  Anus and perineum: Red, wrinkled appearance consistent with LS  Digital rectal exam: Not indicated  Patient informed chaperone available to be present for breast and pelvic exam. Patient has requested no chaperone to be present. Patient has been advised what will be completed during breast and pelvic exam.   Assessment/Plan:  52 y.o. G2P2 for annual exam.   Well female exam with routine gynecological exam - Education provided on SBEs, importance of preventative screenings, current guidelines, high calcium diet, regular exercise, and multivitamin daily. Labs with PCP and cardiology.   Lichen sclerosus - Has Clobetasol but not using. Asymptomatic.   Postmenopausal - no HRT, no bleeding.   Dyspareunia in female - could be from dryness and/or lichen sclerosus. Recommend using oil or silicone lubricant.   Screening for cervical cancer - Normal Pap history. Will repeat at 5-year interval per guidelines.   Screening for breast cancer - Normal mammogram history. Continue annual screenings. Normal breast exam today.  Screening for colon cancer - 09/2021 colonoscopy. Will repeat at 10-year interval per  GI recommendation.   Screening for osteoporosis - Average risk. Will plan DXA at age 60.   Return in 1 year for annual or sooner if needed.    Olivia Mackie DNP, 4:14 PM 10/20/2022

## 2022-11-01 ENCOUNTER — Other Ambulatory Visit: Payer: Self-pay | Admitting: Family Medicine

## 2022-11-01 DIAGNOSIS — E1165 Type 2 diabetes mellitus with hyperglycemia: Secondary | ICD-10-CM

## 2022-11-23 ENCOUNTER — Other Ambulatory Visit: Payer: Self-pay | Admitting: Family Medicine

## 2022-11-23 DIAGNOSIS — E1165 Type 2 diabetes mellitus with hyperglycemia: Secondary | ICD-10-CM

## 2022-11-27 DIAGNOSIS — M25522 Pain in left elbow: Secondary | ICD-10-CM | POA: Diagnosis not present

## 2022-11-30 ENCOUNTER — Ambulatory Visit
Admission: EM | Admit: 2022-11-30 | Discharge: 2022-11-30 | Disposition: A | Discharge status: Home / Self Care | Payer: BC Managed Care – PPO | Attending: Internal Medicine | Admitting: Internal Medicine

## 2022-11-30 DIAGNOSIS — M25522 Pain in left elbow: Secondary | ICD-10-CM | POA: Diagnosis not present

## 2022-11-30 DIAGNOSIS — M545 Low back pain, unspecified: Secondary | ICD-10-CM | POA: Diagnosis not present

## 2022-11-30 DIAGNOSIS — M25571 Pain in right ankle and joints of right foot: Secondary | ICD-10-CM | POA: Diagnosis not present

## 2022-11-30 DIAGNOSIS — M25561 Pain in right knee: Secondary | ICD-10-CM | POA: Diagnosis not present

## 2022-11-30 DIAGNOSIS — W19XXXA Unspecified fall, initial encounter: Secondary | ICD-10-CM

## 2022-11-30 MED ORDER — CYCLOBENZAPRINE HCL 5 MG PO TABS: 5.0000 mg | ORAL_TABLET | Freq: Three times a day (TID) | ORAL | 0 refills | Status: DC | PRN

## 2022-11-30 MED ORDER — ACETAMINOPHEN 325 MG PO TABS: 650.0000 mg | ORAL_TABLET | Freq: Four times a day (QID) | ORAL | 0 refills | Status: DC | PRN

## 2022-11-30 NOTE — ED Provider Notes (Signed)
[facility]  Note:  This document was prepared using Conservation officer, historic buildings and may include unintentional dictation errors.  MRN: [id number] DOB: [birth year]  Subjective:   Dana Rojas is a 52 y.o. female presenting for suffering an accidental fall today while at work.  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  Her left leg slipped forward and her right leg bent under her.  She observed the impact of her fall mostly on the left elbow and her low back.  No head injury, loss conscious, confusion, weakness, numbness or tingling.  Patient is able to bear weight and ambulate.  However, she does feel sore at the low back, the left elbow, the right knee, right ankle.  No bony deformity.  Patient has not taken any medications.  No current facility-administered medications for this encounter.  Current Outpatient Medications:  .  albuterol (PROVENTIL HFA;VENTOLIN HFA) 108 (90 Base) MCG/ACT inhaler, Inhale 2 puffs into the lungs every 4 (four) hours as needed for wheezing or shortness of breath (cough, shortness of breath or wheezing.)., Disp: 1 Inhaler, Rfl: 1 .  B-D UF III MINI PEN NEEDLES 31G X 5 MM MISC, 1 EACH BY DOES NOT APPLY ROUTE 3 (THREE) TIMES DAILY WITH MEALS., Disp: 100 each, Rfl: 0 .  Blood Glucose Monitoring Suppl DEVI, 1 each by Does not apply route in the morning, at noon, and at bedtime. May substitute to any manufacturer covered by patient's insurance., Disp: 1 each, Rfl: 0 .  cetirizine (ZYRTEC) 10 MG tablet, Take 10 mg by mouth daily., Disp: , Rfl:  .  EPINEPHrine 0.3 mg/0.3 mL IJ SOAJ injection, Reason minister if you have any respiratory distress following an insect sting and call 911, Disp: 1 each, Rfl: 2 .  insulin glargine (LANTUS SOLOSTAR) 100 UNIT/ML Solostar Pen, INJECT 20 UNITS INTO THE SKIN DAILY, Disp: 18 mL, Rfl: 0 .  levocetirizine (XYZAL) 5 MG tablet, ,  Disp: , Rfl:  .  metFORMIN (GLUCOPHAGE-XR) 500 MG 24 hr tablet, TAKE 2 TABLETS (1,000 MG TOTAL) BY MOUTH 2 (TWO) TIMES DAILY WITH A MEAL., Disp: 360 tablet, Rfl: 0 .  metoprolol succinate (TOPROL-XL) 25 MG 24 hr tablet, TAKE 1 TABLET (25 MG TOTAL) BY MOUTH DAILY. PATIENT MUST SCHEDULE APPOINTMENT FOR FUTURE REFILLS, Disp: 90 tablet, Rfl: 0 .  ONETOUCH ULTRA test strip, 1 EACH BY IN VITRO ROUTE IN THE MORNING, AT NOON, AND AT BEDTIME, Disp: 100 strip, Rfl: 0 .  rosuvastatin (CRESTOR) 20 MG tablet, Take 1 tablet (20 mg total) by mouth daily., Disp: 90 tablet, Rfl: 0 .  scopolamine (TRANSDERM-SCOP) 1 MG/3DAYS, Place 1 patch (1.5 mg total) onto the skin every 3 (three) days., Disp: 10 patch, Rfl: 12   Allergies  Allergen Reactions  . Bee Venom     Body swelling  . Fluogen [Influenza Virus Vaccine] Diarrhea and Nausea And Vomiting    Past Medical History:  Diagnosis Date  . Allergy   . Atrial fibrillation with tachycardic ventricular rate (HCC) 12/08/2019  . Seasonal allergies   . Vitamin D deficiency      Past Surgical History:  Procedure Laterality Date  . HERNIA REPAIR    . HERNIA REPAIR     X2  . LEFT HEART CATH AND CORONARY ANGIOGRAPHY N/A 12/11/2019   Procedure: LEFT HEART CATH AND CORONARY ANGIOGRAPHY;  Surgeon: Runell Gess, MD;  Location: [facility];  Service: Cardiovascular;  Laterality: N/A;  . TUBAL LIGATION      Family History  Problem Relation Age of Onset  . Heart disease Mother   . Heart disease Father   . Diabetes Paternal Grandfather   . Thyroid disease Neg Hx     Social History   Tobacco Use  . Smoking status: Never    Passive exposure: Never  . Smokeless tobacco: Never  Vaping Use  . Vaping status: Never Used  Substance Use Topics  . Alcohol use: No  . Drug use: No    ROS   Objective:   Vitals: BP (!) 161/78 (BP Location: Right Arm)   Pulse 74   Temp (!) 97.4 F (36.3 C) (Oral)   Resp 16   LMP 12/24/2020   SpO2 96%    Physical Exam Constitutional:      General: She is not in acute distress.    Appearance: Normal appearance. She is well-developed. She is not ill-appearing, toxic-appearing or diaphoretic.  HENT:     Head: Normocephalic and atraumatic.     Right Ear: External ear normal.     Left Ear: External ear normal.     Nose: Nose normal.     Mouth/Throat:     Mouth: Mucous membranes are moist.  Eyes:     General: No scleral icterus.       Right eye: No discharge.        Left eye: No discharge.     Extraocular Movements: Extraocular movements intact.  Cardiovascular:     Rate and Rhythm: Normal rate.  Pulmonary:     Effort: Pulmonary effort is normal.  Musculoskeletal:     Cervical back: Normal range of motion and neck supple. No swelling, edema, deformity, erythema, signs of trauma, lacerations, rigidity, spasms, torticollis, tenderness, bony tenderness or crepitus. No pain with movement, spinous process tenderness or muscular tenderness. Normal range of motion.     Thoracic back: No swelling, edema, deformity, signs of trauma, lacerations, spasms, tenderness or bony tenderness. Normal range of motion. No scoliosis.     Lumbar back: Spasms and tenderness (movement pains) present. No swelling, edema, deformity, signs of trauma, lacerations or bony tenderness. Normal range of motion. Negative right straight leg raise test and negative left straight leg raise test. No scoliosis.     Right knee: Swelling present. No deformity, effusion, erythema, ecchymosis, lacerations, bony tenderness or crepitus. Normal range of motion. Tenderness present over the patellar tendon. No medial joint line or lateral joint line tenderness. Normal alignment and normal patellar mobility.     Right lower leg: No deformity, lacerations, tenderness or bony tenderness.     Right ankle: Swelling present. No deformity, ecchymosis or lacerations. Tenderness (anteriorly) present. Normal range of motion.     Right Achilles  Tendon: No tenderness or defects. Thompson's test negative.     Right foot: Normal range of motion and normal capillary refill. No swelling, deformity, laceration, tenderness, bony tenderness or crepitus.  Skin:    General: Skin is warm and dry.  Neurological:     General: No focal deficit present.     Mental Status: She is alert and oriented to person, place, and time.     Cranial Nerves: No cranial nerve deficit.     Motor: No weakness.     Coordination: Coordination normal.     Gait: Gait normal.     Deep Tendon Reflexes: Reflexes normal.  Psychiatric:        Mood and Affect:  Mood normal.        Behavior: Behavior normal.    Assessment and Plan :   PDMP not reviewed this encounter.  1. Left elbow pain   2. Acute pain of right knee   3. Acute right ankle pain   4. Acute bilateral low back pain without sciatica   5. Accidental fall, initial encounter    Low suspicion for fracture.  Still I offered patient imaging and she declined for now.  I am in agreement.  Will manage conservatively for back strain, left elbow contusion, right lower leg strain with Tylenol and muscle relaxant, rest and modification of physical activity.  Anticipatory guidance provided.  Counseled patient on potential for adverse effects with medications prescribed/recommended today, ER and return-to-clinic precautions discussed, patient verbalized understanding.    Wallis Bamberg, PA-C 11/30/22 1048

## 2022-11-30 NOTE — ED Triage Notes (Signed)
Pt states she slipped/fell ~745am-pain to left elbow, right knee and right ankle and mid/lower back pain-no pain meds PTA-NAD-steady gait

## 2022-12-18 ENCOUNTER — Ambulatory Visit: Payer: BC Managed Care – PPO | Admitting: Family Medicine

## 2022-12-18 ENCOUNTER — Encounter: Payer: Self-pay | Admitting: Family Medicine

## 2022-12-18 VITALS — BP 124/82 | HR 80 | Temp 97.5°F | Wt 170.6 lb

## 2022-12-18 DIAGNOSIS — R110 Nausea: Secondary | ICD-10-CM | POA: Diagnosis not present

## 2022-12-18 DIAGNOSIS — E1165 Type 2 diabetes mellitus with hyperglycemia: Secondary | ICD-10-CM

## 2022-12-18 DIAGNOSIS — R197 Diarrhea, unspecified: Secondary | ICD-10-CM

## 2022-12-18 DIAGNOSIS — R1010 Upper abdominal pain, unspecified: Secondary | ICD-10-CM | POA: Insufficient documentation

## 2022-12-18 DIAGNOSIS — E782 Mixed hyperlipidemia: Secondary | ICD-10-CM

## 2022-12-18 LAB — CBC WITH DIFFERENTIAL/PLATELET
Basophils Absolute: 0 10*3/uL (ref 0.0–0.1)
Basophils Relative: 0.6 % (ref 0.0–3.0)
Eosinophils Absolute: 0.1 10*3/uL (ref 0.0–0.7)
Eosinophils Relative: 1.5 % (ref 0.0–5.0)
HCT: 44.7 % (ref 36.0–46.0)
Hemoglobin: 14.2 g/dL (ref 12.0–15.0)
Lymphocytes Relative: 31.9 % (ref 12.0–46.0)
Lymphs Abs: 2.3 10*3/uL (ref 0.7–4.0)
MCHC: 31.7 g/dL (ref 30.0–36.0)
MCV: 79.2 fL (ref 78.0–100.0)
Monocytes Absolute: 0.5 10*3/uL (ref 0.1–1.0)
Monocytes Relative: 6.7 % (ref 3.0–12.0)
Neutro Abs: 4.2 10*3/uL (ref 1.4–7.7)
Neutrophils Relative %: 59.3 % (ref 43.0–77.0)
Platelets: 350 10*3/uL (ref 150.0–400.0)
RBC: 5.65 Mil/uL — ABNORMAL HIGH (ref 3.87–5.11)
RDW: 14 % (ref 11.5–15.5)
WBC: 7.1 10*3/uL (ref 4.0–10.5)

## 2022-12-18 LAB — COMPREHENSIVE METABOLIC PANEL
ALT: 34 U/L (ref 0–35)
AST: 27 U/L (ref 0–37)
Albumin: 4.5 g/dL (ref 3.5–5.2)
Alkaline Phosphatase: 89 U/L (ref 39–117)
BUN: 12 mg/dL (ref 6–23)
CO2: 26 meq/L (ref 19–32)
Calcium: 9.9 mg/dL (ref 8.4–10.5)
Chloride: 101 meq/L (ref 96–112)
Creatinine, Ser: 0.52 mg/dL (ref 0.40–1.20)
GFR: 106.7 mL/min (ref >60.00)
Glucose, Bld: 172 mg/dL — ABNORMAL HIGH (ref 70–99)
Potassium: 4 meq/L (ref 3.5–5.1)
Sodium: 141 meq/L (ref 135–145)
Total Bilirubin: 0.5 mg/dL (ref 0.2–1.2)
Total Protein: 7.5 g/dL (ref 6.0–8.3)

## 2022-12-18 LAB — HEMOGLOBIN A1C: Hgb A1c MFr Bld: 9.7 % — ABNORMAL HIGH (ref 4.6–6.5)

## 2022-12-18 LAB — LIPASE: Lipase: 34 U/L (ref 11.0–59.0)

## 2022-12-18 MED ORDER — ONDANSETRON 4 MG PO TBDP: 4.0000 mg | ORAL_TABLET | Freq: Three times a day (TID) | ORAL | 0 refills | Status: DC | PRN

## 2022-12-18 NOTE — Assessment & Plan Note (Signed)
Patient presents with fatigue, nausea, diarrhea, and abdominal tenderness, most likely viral gastroenteritis. Consider differential diagnosis for gallbladder or pancreas pathology given tenderness in the right upper quadrant.  Differential Diagnosis: Viral Gastroenteritis Cholecystitis Pancreatitis  Plan: Ondansetron 4 mg Q4H PRN for nausea Fluid intake encouraged CT abdomen if symptoms worsen or right upper quadrant tenderness increases Labs: CBC, CMP, Lipase, stool PCR, FOBT Follow-up PRN or if symptoms worsen, recommend ED visit for possible CT scan

## 2022-12-18 NOTE — Progress Notes (Signed)
Assessment/Plan:   Problem List Items Addressed This Visit       Endocrine   Type 2 diabetes mellitus with hyperglycemia, without long-term current use of insulin (HCC)   Relevant Orders   Hemoglobin A1C     Other   Hyperlipidemia   Relevant Orders   Lipid panel   Upper abdominal pain - Primary    Patient presents with fatigue, nausea, diarrhea, and abdominal tenderness, most likely viral gastroenteritis. Consider differential diagnosis for gallbladder or pancreas pathology given tenderness in the right upper quadrant.  Differential Diagnosis: Viral Gastroenteritis Cholecystitis Pancreatitis  Plan: Ondansetron 4 mg Q4H PRN for nausea Fluid intake encouraged CT abdomen if symptoms worsen or right upper quadrant tenderness increases Labs: CBC, CMP, Lipase, stool PCR, FOBT Follow-up PRN or if symptoms worsen, recommend ED visit for possible CT scan      Relevant Orders   Comp Met (CMET)   Lipase   CBC w/Diff   Urinalysis w microscopic + reflex cultur   Other Visit Diagnoses     Nausea       Relevant Medications   ondansetron (ZOFRAN-ODT) 4 MG disintegrating tablet   Diarrhea of presumed infectious origin       Relevant Orders   GI Profile, Stool, PCR   Fecal occult blood, imunochemical       There are no discontinued medications.  Return if symptoms worsen or fail to improve.    Subjective:   Encounter date: 12/18/2022  Dana Rojas is a 52 y.o. female who has Hyperlipidemia; OBESITY; GESTATIONAL DIABETES; SCOLIOSIS; HEADACHE; CHICKENPOX, HX OF; Intramural leiomyoma of uterus; Menorrhagia with regular cycle; Hyperthyroidism; Sciatica of left side; New onset a-fib (HCC); Bradycardia, sinus; Non-intractable vomiting; Hypokalemia; Gastroenteritis; Hyperglycemia; Atrial fibrillation with RVR (HCC); Troponin level elevated; Flash pulmonary edema (HCC); Family history of coronary artery disease; Laryngopharyngeal reflux (LPR); Sensorineural hearing loss  (SNHL), bilateral; Tinnitus of both ears; Vertigo; Type 2 diabetes mellitus with hyperglycemia, without long-term current use of insulin (HCC); Hymenoptera allergy; and Upper abdominal pain on their problem list..   She  has a past medical history of Allergy, Atrial fibrillation with tachycardic ventricular rate (HCC) (12/08/2019), Seasonal allergies, and Vitamin D deficiency..   Chief Complaint: Fatigue, nausea, diarrhea, and abdominal tenderness  History of Present Illness:  Gastrointestinal Symptoms. The patient reports the onset of symptoms including fatigue, nausea, diarrhea, and abdominal tenderness that began two days ago, on Wednesday. The patient noted severe fatigue the preceding Tuesday, leading to laying around all day. 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 The patient reports using the bathroom frequently but denies any vomiting or blood in the stool. Abdominal tenderness has increased. The patient experienced both hot and cold sensations but measured temperatures were not febrile. The patient denies any recent cough, sore throat, chest pain, shortness of breath, urinary symptoms, or vaginal discharge or bleeding.  She does not know of any known sick contacts.  Medication History. The patient was recently on steroids, which increased blood sugar levels, and methocarbamol (for a muscle issue related to the arm). The patient discontinued these medications approximately one week prior to the onset of current symptoms. Current medications include an over-the-counter esomeprazole for acid reflux, which has been effective. Blood sugar levels have been elevated since starting steroids with recent recordings of 170-211 mg/dL. The patient is otherwise compliant with diabetes management but is currently fasting for blood work.    Past  Surgical History:  Procedure Laterality Date   HERNIA REPAIR      HERNIA REPAIR     X2   LEFT HEART CATH AND CORONARY ANGIOGRAPHY N/A 12/11/2019   Procedure: LEFT HEART CATH AND CORONARY ANGIOGRAPHY;  Surgeon: Runell Gess, MD;  Location: [facility];  Service: Cardiovascular;  Laterality: N/A;   TUBAL LIGATION      Outpatient Medications Prior to Visit  Medication Sig Dispense Refill   acetaminophen (TYLENOL) 325 MG tablet Take 2 tablets (650 mg total) by mouth every 6 (six) hours as needed for moderate pain. 30 tablet 0   albuterol (PROVENTIL HFA;VENTOLIN HFA) 108 (90 Base) MCG/ACT inhaler Inhale 2 puffs into the lungs every 4 (four) hours as needed for wheezing or shortness of breath (cough, shortness of breath or wheezing.). 1 Inhaler 1   B-D UF III MINI PEN NEEDLES 31G X 5 MM MISC 1 EACH BY DOES NOT APPLY ROUTE 3 (THREE) TIMES DAILY WITH MEALS. 100 each 0   Blood Glucose Monitoring Suppl DEVI 1 each by Does not apply route in the morning, at noon, and at bedtime. May substitute to any manufacturer covered by patient's insurance. 1 each 0   cetirizine (ZYRTEC) 10 MG tablet Take 10 mg by mouth daily.     EPINEPHrine 0.3 mg/0.3 mL IJ SOAJ injection Reason minister if you have any respiratory distress following an insect sting and call 911 1 each 2   insulin glargine (LANTUS SOLOSTAR) 100 UNIT/ML Solostar Pen INJECT 20 UNITS INTO THE SKIN DAILY 18 mL 0   levocetirizine (XYZAL) 5 MG tablet      metFORMIN (GLUCOPHAGE-XR) 500 MG 24 hr tablet TAKE 2 TABLETS (1,000 MG TOTAL) BY MOUTH 2 (TWO) TIMES DAILY WITH A MEAL. 360 tablet 0   metoprolol succinate (TOPROL-XL) 25 MG 24 hr tablet TAKE 1 TABLET (25 MG TOTAL) BY MOUTH DAILY. PATIENT MUST SCHEDULE APPOINTMENT FOR FUTURE REFILLS 90 tablet 0   omeprazole (PRILOSEC OTC) 20 MG tablet Take 20 mg by mouth daily.     ONETOUCH ULTRA test strip 1 EACH BY IN VITRO ROUTE IN THE MORNING, AT NOON, AND AT BEDTIME 100 strip 0   rosuvastatin (CRESTOR) 20 MG tablet Take 1 tablet (20 mg total) by mouth daily. 90  tablet 0   cyclobenzaprine (FLEXERIL) 5 MG tablet Take 1 tablet (5 mg total) by mouth 3 (three) times daily as needed for muscle spasms. (Patient not taking: Reported on 12/18/2022) 30 tablet 0   scopolamine (TRANSDERM-SCOP) 1 MG/3DAYS Place 1 patch (1.5 mg total) onto the skin every 3 (three) days. (Patient not taking: Reported on 12/18/2022) 10 patch 12   No facility-administered medications prior to visit.    Family History  Problem Relation Age of Onset   Heart disease Mother    Heart disease Father    Diabetes Paternal Grandfather    Thyroid disease Neg Hx     Social History   Socioeconomic History   Marital status: Married    Spouse name: Not on file   Number of children: Not on file   Years of education: Not on file   Highest education level: Not on file  Occupational History   Not on file  Tobacco Use   Smoking status: Never    Passive exposure: Never   Smokeless tobacco: Never  Vaping Use   Vaping status: Never Used  Substance and Sexual Activity   Alcohol use: No   Drug use: No   Sexual activity: Yes  Partners: Male    Birth control/protection: Surgical    Comment: tubal ligation   Other Topics Concern   Not on file  Social History Narrative   Not on file   Social Determinants of Health   Financial Resource Strain: Not on file  Food Insecurity: Not on file  Transportation Needs: Not on file  Physical Activity: Not on file  Stress: Not on file  Social Connections: Not on file  Intimate Partner Violence: Not on file                                                                                                  Objective:  Physical Exam: BP 124/82 (BP Location: Left Arm, Patient Position: Sitting, Cuff Size: Large)   Pulse 80   Temp (!) 97.5 F (36.4 C) (Temporal)   Wt 170 lb 9.6 oz (77.4 kg)   LMP 12/24/2020   SpO2 99%   BMI 31.20 kg/m    Wt Readings from Last 3 Encounters:  12/18/22 170 lb 9.6 oz (77.4 kg)  10/20/22 179 lb (81.2 kg)   08/17/22 170 lb 3.2 oz (77.2 kg)    Physical Exam Constitutional:      General: She is not in acute distress.    Appearance: Normal appearance. She is not ill-appearing or toxic-appearing.  HENT:     Head: Normocephalic and atraumatic.     Nose: Nose normal. No congestion.  Eyes:     General: No scleral icterus.    Extraocular Movements: Extraocular movements intact.  Cardiovascular:     Rate and Rhythm: Normal rate and regular rhythm.     Pulses: Normal pulses.     Heart sounds: Normal heart sounds.  Pulmonary:     Effort: Pulmonary effort is normal. No respiratory distress.     Breath sounds: Normal breath sounds.  Abdominal:     General: Abdomen is flat. Bowel sounds are normal.     Palpations: Abdomen is soft.     Tenderness: There is abdominal tenderness in the right upper quadrant and left upper quadrant. There is no guarding or rebound. Positive signs include Murphy's sign.  Musculoskeletal:        General: Normal range of motion.  Lymphadenopathy:     Cervical: No cervical adenopathy.  Skin:    General: Skin is warm and dry.     Findings: No rash.  Neurological:     General: No focal deficit present.     Mental Status: She is alert and oriented to person, place, and time. Mental status is at baseline.  Psychiatric:        Mood and Affect: Mood normal.        Behavior: Behavior normal.        Thought Content: Thought content normal.        Judgment: Judgment normal.     No results found.  No results found for this or any previous visit (from the past 2160 hour(s)).      Garner Nash, MD, MS

## 2022-12-18 NOTE — Patient Instructions (Signed)
Begin taking Ondansetron 4 mg every 8 hours as needed for nausea. Stay hydrated with plenty of fluids, especially water. Follow a simple diet that is easy to tolerate (e.g., bland, low-fat foods). Monitor your symptoms; if your abdominal pain or nausea worsens, go to the emergency department. Return to the clinic if your symptoms persist or if you develop new symptoms such as vomiting or blood in your stool.

## 2022-12-21 LAB — URINALYSIS W MICROSCOPIC + REFLEX CULTURE
Bilirubin Urine: NEGATIVE
Glucose, UA: NEGATIVE
Hgb urine dipstick: NEGATIVE
Hyaline Cast: NONE SEEN /[LPF]
Ketones, ur: NEGATIVE
Nitrites, Initial: NEGATIVE
RBC / HPF: NONE SEEN /HPF (ref 0–2)
Specific Gravity, Urine: 1.024 (ref 1.001–1.035)
pH: 5.5 (ref 5.0–8.0)

## 2022-12-21 LAB — URINE CULTURE
MICRO NUMBER:: 15648148
Result:: NO GROWTH
SPECIMEN QUALITY:: ADEQUATE

## 2022-12-21 LAB — CULTURE INDICATED

## 2022-12-22 LAB — TIQ-MISC

## 2022-12-22 LAB — HOUSE ACCOUNT TRACKING

## 2022-12-24 ENCOUNTER — Other Ambulatory Visit: Payer: Self-pay | Admitting: Family Medicine

## 2022-12-24 DIAGNOSIS — E785 Hyperlipidemia, unspecified: Secondary | ICD-10-CM

## 2023-01-29 ENCOUNTER — Other Ambulatory Visit: Payer: Self-pay | Admitting: Family Medicine

## 2023-01-29 DIAGNOSIS — E1165 Type 2 diabetes mellitus with hyperglycemia: Secondary | ICD-10-CM

## 2023-02-03 ENCOUNTER — Other Ambulatory Visit: Payer: Self-pay | Admitting: Family Medicine

## 2023-02-25 ENCOUNTER — Other Ambulatory Visit: Payer: Self-pay | Admitting: Family Medicine

## 2023-02-25 DIAGNOSIS — E1165 Type 2 diabetes mellitus with hyperglycemia: Secondary | ICD-10-CM

## 2023-03-10 ENCOUNTER — Ambulatory Visit: Payer: BC Managed Care – PPO | Admitting: Physician Assistant

## 2023-03-10 ENCOUNTER — Encounter: Payer: Self-pay | Admitting: Physician Assistant

## 2023-03-10 ENCOUNTER — Ambulatory Visit: Payer: Self-pay | Admitting: Family Medicine

## 2023-03-10 VITALS — BP 135/84 | HR 84 | Temp 97.7°F | Ht 62.0 in | Wt 177.5 lb

## 2023-03-10 DIAGNOSIS — N3001 Acute cystitis with hematuria: Secondary | ICD-10-CM | POA: Diagnosis not present

## 2023-03-10 DIAGNOSIS — R300 Dysuria: Secondary | ICD-10-CM

## 2023-03-10 DIAGNOSIS — R319 Hematuria, unspecified: Secondary | ICD-10-CM | POA: Diagnosis not present

## 2023-03-10 LAB — POCT URINALYSIS DIP (MANUAL ENTRY)
Bilirubin, UA: NEGATIVE
Glucose, UA: >=1000 mg/dL — AB
Nitrite, UA: NEGATIVE
Protein Ur, POC: 30 mg/dL — AB
Spec Grav, UA: 1.025 (ref 1.010–1.025)
Urobilinogen, UA: 0.2 U/dL
pH, UA: 5 (ref 5.0–8.0)

## 2023-03-10 MED ORDER — CEPHALEXIN 500 MG PO CAPS: 500.0000 mg | ORAL_CAPSULE | Freq: Two times a day (BID) | ORAL | 0 refills | Status: AC

## 2023-03-10 NOTE — Telephone Encounter (Signed)
 Copied from CRM [id number]. Topic: Clinical - Red Word Triage >> Mar 10, 2023  1:34 PM Chuck Crater wrote: [facility] that prompted transfer to Nurse Triage: Patient stated that she has not had a menstrual cycle in 2 years and is now experiencing slight irritation light bleeding, urge to urinate but no successful, and warm cramping feeling. She think she has a possible UTI.  Chief Complaint: Urinary frequency Symptoms: Urinary urge and frequency, blood in urine Frequency: Since this morning Pertinent Negatives: Patient denies fever and flank pain Disposition: [] ED /[] Urgent Care (no appt availability in office) / [x] Appointment(In office/virtual)/ []  [facility]/ [] Home Care/ [] Refused Recommended Disposition /[] [facility]/ []  Follow-up with PCP Additional Notes: Patient called in to report urinary symptoms that started this morning shortly after she got to work. Patient stated she is experiencing urge to urinate, frequent urination, blood in urine, decreased urine output, and abdominal cramping. Patient denies abdominal pain and fullness. Advised patient to see a provider within 24 hours. No availability for today at patient's current PCP. Scheduled patient at different office for this afternoon. Advised patient to increase fluid intake and call back if symptoms worsen. Patient complied.  Reason for Disposition  Urinating more frequently than usual (i.e., frequency)  Answer Assessment - Initial Assessment Questions 1. SYMPTOM: "What's the main symptom you're concerned about?" (e.g., frequency, incontinence)     Urinary urge, urinary frequency, blood in urine past two times, and abdomen is warm and crampy  2. ONSET: "When did the  symptoms  start?"     This morning when patient got to work   3. PAIN: "Is there any pain?" If Yes, ask: "How bad is it?" (Scale: 1-10; mild, moderate, severe)     Yes, about a 7, burning and pressure when peeing  4. CAUSE: "What do you think is  causing the symptoms?"     UTI  5. OTHER SYMPTOMS: "Do you have any other symptoms?" (e.g., blood in urine, fever, flank pain, pain with urination)     Denies fever and flank pain  Protocols used: Urinary Symptoms-A-AH

## 2023-03-10 NOTE — Progress Notes (Signed)
 Established patient visit   Patient: Dana Rojas   DOB: [birth year]   53 y.o. Female  MRN: [id number] Visit Date: 03/10/2023  Today's healthcare provider: Trenton Frock, PA-C   Chief Complaint  Patient presents with   Urinary Tract Infection    Just started today- Burning while urinating-    Subjective    Pt reports starting today, dysuria, hematuria, urinary frequency, lower abdominal pressure. Denies vaginal itching, discharge.  She at first thought she was menstruating, lmp 2 years. Denies bleeding w/o urination.  Some difficulty passing urine-- she does not feel empty.   Medications: Outpatient Medications Prior to Visit  Medication Sig   acetaminophen  (TYLENOL ) 325 MG tablet Take 2 tablets (650 mg total) by mouth every 6 (six) hours as needed for moderate pain.   albuterol  (PROVENTIL  HFA;VENTOLIN  HFA) 108 (90 Base) MCG/ACT inhaler Inhale 2 puffs into the lungs every 4 (four) hours as needed for wheezing or shortness of breath (cough, shortness of breath or wheezing.).   B-D UF III MINI PEN NEEDLES 31G X 5 MM MISC 1 EACH BY DOES NOT APPLY ROUTE 3 (THREE) TIMES DAILY WITH MEALS.   Blood Glucose Monitoring Suppl DEVI 1 each by Does not apply route in the morning, at noon, and at bedtime. May substitute to any manufacturer covered by patient's insurance.   cetirizine (ZYRTEC) 10 MG tablet Take 10 mg by mouth daily.   cyclobenzaprine  (FLEXERIL ) 5 MG tablet Take 1 tablet (5 mg total) by mouth 3 (three) times daily as needed for muscle spasms.   EPINEPHrine  0.3 mg/0.3 mL IJ SOAJ injection Reason minister if you have any respiratory distress following an insect sting and call 911   insulin  glargine (LANTUS  SOLOSTAR) 100 UNIT/ML Solostar Pen INJECT 20 UNITS INTO THE SKIN DAILY   levocetirizine (XYZAL ) 5 MG tablet    metFORMIN  (GLUCOPHAGE -XR) 500 MG 24 hr tablet TAKE 2 TABLETS (1,000 MG TOTAL) BY MOUTH 2 (TWO) TIMES DAILY WITH A MEAL.   omeprazole  (PRILOSEC OTC) 20 MG  tablet Take 20 mg by mouth daily.   ondansetron  (ZOFRAN -ODT) 4 MG disintegrating tablet Take 1 tablet (4 mg total) by mouth every 8 (eight) hours as needed for nausea or vomiting.   ONETOUCH ULTRA test strip 1 EACH BY IN VITRO ROUTE IN THE MORNING, AT NOON, AND AT BEDTIME   rosuvastatin  (CRESTOR ) 20 MG tablet TAKE 1 TABLET BY MOUTH EVERY DAY   scopolamine  (TRANSDERM-SCOP) 1 MG/3DAYS Place 1 patch (1.5 mg total) onto the skin every 3 (three) days.   metoprolol  succinate (TOPROL -XL) 25 MG 24 hr tablet TAKE 1 TABLET (25 MG TOTAL) BY MOUTH DAILY. PATIENT MUST SCHEDULE APPOINTMENT FOR FUTURE REFILLS   No facility-administered medications prior to visit.    Review of Systems  Constitutional:  Negative for fatigue and fever.  Respiratory:  Negative for cough and shortness of breath.   Cardiovascular:  Negative for chest pain and leg swelling.  Gastrointestinal:  Negative for abdominal pain.  Genitourinary:  Positive for decreased urine volume, dysuria, frequency, hematuria and urgency.  Neurological:  Negative for dizziness and headaches.       Objective    BP 135/84   Pulse 84   Temp 97.7 F (36.5 C) (Oral)   Ht 5\' 2"  (1.575 m)   Wt 177 lb 8 oz (80.5 kg)   LMP 12/24/2020   SpO2 98%   BMI 32.47 kg/m    Physical Exam Constitutional:      General: She is awake.  Appearance: She is well-developed.  HENT:     Head: Normocephalic.  Eyes:     Conjunctiva/sclera: Conjunctivae normal.  Cardiovascular:     Rate and Rhythm: Normal rate and regular rhythm.     Heart sounds: Normal heart sounds.  Pulmonary:     Effort: Pulmonary effort is normal. No respiratory distress.  Abdominal:     General: Abdomen is protuberant.     Tenderness: There is generalized abdominal tenderness and tenderness in the right lower quadrant, epigastric area, suprapubic area and left lower quadrant.     Comments: Belly is firm and distended, but patient is not relaxed  Skin:    General: Skin is warm.   Neurological:     Mental Status: She is alert and oriented to person, place, and time.  Psychiatric:        Attention and Perception: Attention normal.        Mood and Affect: Mood normal.        Speech: Speech normal.        Behavior: Behavior is cooperative.      Results for orders placed or performed in visit on 03/10/23  POCT urinalysis dipstick  Result Value Ref Range   Color, UA straw (A) yellow   Clarity, UA cloudy (A) clear   Glucose, UA >=1,000 (A) negative mg/dL   Bilirubin, UA negative negative   Ketones, POC UA trace (5) (A) negative mg/dL   Spec Grav, UA 6.578 4.696 - 1.025   Blood, UA large (A) negative   pH, UA 5.0 5.0 - 8.0   Protein Ur, POC =30 (A) negative mg/dL   Urobilinogen, UA 0.2 0.2 or 1.0 E.U./dL   Nitrite, UA Negative Negative   Leukocytes, UA Moderate (2+) (A) Negative    Assessment & Plan    Hematuria, unspecified type -     POCT urinalysis dipstick -     Urine Culture  Acute cystitis with hematuria -     Urine Microscopic -     Cephalexin ; Take 1 capsule (500 mg total) by mouth 2 (two) times daily for 5 days.  Dispense: 10 capsule; Refill: 0  Dysuria   UA today + blood, leuks -- significant glucose pt reports she is fasting and only eating fruit-- advised against   Tx with keflex  pobid x 5 days Ordering culture, urine micro  Advised pt to f/u with PCP if symptoms, especially hematuria, does not clear  Return if symptoms worsen or fail to improve.       Trenton Frock, PA-C  [facility] [phone] (phone) [phone] (fax)  [facility]

## 2023-03-11 LAB — URINE CULTURE
MICRO NUMBER:: 15958934
SPECIMEN QUALITY:: ADEQUATE

## 2023-03-11 LAB — URINALYSIS, MICROSCOPIC ONLY

## 2023-03-12 ENCOUNTER — Other Ambulatory Visit: Payer: Self-pay | Admitting: Physician Assistant

## 2023-03-12 DIAGNOSIS — R310 Gross hematuria: Secondary | ICD-10-CM

## 2023-03-25 ENCOUNTER — Other Ambulatory Visit: Payer: Self-pay | Admitting: Family Medicine

## 2023-03-25 DIAGNOSIS — E785 Hyperlipidemia, unspecified: Secondary | ICD-10-CM

## 2023-03-30 ENCOUNTER — Other Ambulatory Visit: Payer: Self-pay | Admitting: Family Medicine

## 2023-03-30 DIAGNOSIS — E1165 Type 2 diabetes mellitus with hyperglycemia: Secondary | ICD-10-CM

## 2023-04-02 ENCOUNTER — Ambulatory Visit
Admission: EM | Admit: 2023-04-02 | Discharge: 2023-04-02 | Disposition: A | Discharge status: Home / Self Care | Payer: BC Managed Care – PPO | Attending: Family Medicine | Admitting: Family Medicine

## 2023-04-02 DIAGNOSIS — J101 Influenza due to other identified influenza virus with other respiratory manifestations: Secondary | ICD-10-CM | POA: Diagnosis not present

## 2023-04-02 LAB — POCT INFLUENZA A/B
Influenza A, POC: POSITIVE — AB
Influenza B, POC: NEGATIVE

## 2023-04-02 MED ORDER — BENZONATATE 200 MG PO CAPS: 200.0000 mg | ORAL_CAPSULE | Freq: Three times a day (TID) | ORAL | 0 refills | Status: DC | PRN

## 2023-04-02 MED ORDER — OSELTAMIVIR PHOSPHATE 75 MG PO CAPS: 75.0000 mg | ORAL_CAPSULE | Freq: Two times a day (BID) | ORAL | 0 refills | Status: DC

## 2023-04-02 MED ORDER — ALBUTEROL SULFATE HFA 108 (90 BASE) MCG/ACT IN AERS: 1.0000 | INHALATION_SPRAY | Freq: Four times a day (QID) | RESPIRATORY_TRACT | 0 refills | Status: AC | PRN

## 2023-04-02 NOTE — ED Provider Notes (Addendum)
 [facility]    CSN: [id number] Arrival date & time: 04/02/23  1504      History   Chief Complaint Chief Complaint  Patient presents with   Fever   Cough    HPI Dana Rojas is a 53 y.o. female  presents for evaluation of URI symptoms for 2-3 days. Patient reports associated symptoms of cough, congestion, fever of 102, sore throat, body aches. Denies N/V/D, ear pain, shortness of breath. Patient does not have a hx of asthma. Patient not not an active smoker.   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  Pt has taken Tylenol  or ibuprofen, TheraFlu OTC for symptoms. Pt has no other concerns at this time.    Fever Associated symptoms: congestion, cough, myalgias and sore throat   Cough Associated symptoms: fever, myalgias and sore throat     Past Medical History:  Diagnosis Date   Allergy    Atrial fibrillation with tachycardic ventricular rate (HCC) 12/08/2019   Seasonal allergies    Vitamin D  deficiency     Patient Active Problem List   Diagnosis Date Noted   Upper abdominal pain 12/18/2022   Vertigo 08/05/2022   Type 2 diabetes mellitus with hyperglycemia, without long-term current use of insulin  (HCC) 08/05/2022   Hymenoptera allergy 08/05/2022   Laryngopharyngeal reflux (LPR) 08/18/2021   Sensorineural hearing loss (SNHL), bilateral 08/18/2021   Tinnitus of both ears 08/18/2021   Family history of coronary artery disease 02/12/2020   Hypokalemia 12/09/2019   Gastroenteritis 12/09/2019   Hyperglycemia 12/09/2019   Atrial fibrillation with RVR (HCC) 12/09/2019   Troponin level elevated 12/09/2019   Flash pulmonary edema (HCC)    New onset a-fib (HCC) 12/08/2019   Bradycardia, sinus 12/08/2019   Non-intractable vomiting 12/08/2019   Sciatica of left side 08/05/2016   Hyperthyroidism 12/20/2014   Intramural leiomyoma of uterus 02/02/2014   Menorrhagia with regular cycle 02/02/2014   Hyperlipidemia 08/12/2009   OBESITY 08/12/2009   GESTATIONAL  DIABETES 08/12/2009   SCOLIOSIS 08/12/2009   HEADACHE 08/12/2009   CHICKENPOX, HX OF 08/12/2009    Past Surgical History:  Procedure Laterality Date   HERNIA REPAIR     HERNIA REPAIR     X2   LEFT HEART CATH AND CORONARY ANGIOGRAPHY N/A 12/11/2019   Procedure: LEFT HEART CATH AND CORONARY ANGIOGRAPHY;  Surgeon: Court Dorn PARAS, MD;  [facility];  Service: Cardiovascular;  Laterality: N/A;   TUBAL LIGATION      OB History     Gravida  2   Para  2   Term      Preterm      AB      Living  2      SAB      IAB      Ectopic      Multiple      Live Births               Home Medications    Prior to Admission medications   Medication Sig Start Date End Date Taking? Authorizing Provider  albuterol  (VENTOLIN  HFA) 108 (90 Base) MCG/ACT inhaler Inhale 1-2 puffs into the lungs every 6 (six) hours as needed. 04/02/23  Yes Suhani Stillion, Jodi R, NP  benzonatate  (TESSALON ) 200 MG capsule Take 1 capsule (200 mg total) by mouth 3 (three) times daily as needed. 04/02/23  Yes Maiya Kates, Jodi R, NP  oseltamivir  (TAMIFLU ) 75 MG capsule Take 1 capsule (75 mg total) by mouth every 12 (twelve)  hours. 04/02/23  Yes Kdyn Vonbehren, Jodi R, NP  acetaminophen  (TYLENOL ) 325 MG tablet Take 2 tablets (650 mg total) by mouth every 6 (six) hours as needed for moderate pain. 11/30/22   Christopher Savannah, PA-C  B-D UF III MINI PEN NEEDLES 31G X 5 MM MISC 1 EACH BY DOES NOT APPLY ROUTE 3 (THREE) TIMES DAILY WITH MEALS. 09/14/22   McElwee, Lauren A, NP  Blood Glucose Monitoring Suppl DEVI 1 each by Does not apply route in the morning, at noon, and at bedtime. May substitute to any manufacturer covered by patient's insurance. 08/03/22   Sebastian Beverley NOVAK, MD  cetirizine (ZYRTEC) 10 MG tablet Take 10 mg by mouth daily.    [provider]  cyclobenzaprine  (FLEXERIL ) 5 MG tablet Take 1 tablet (5 mg total) by mouth 3 (three) times daily as needed for muscle spasms. 11/30/22   Christopher Savannah, PA-C  EPINEPHrine   0.3 mg/0.3 mL IJ SOAJ injection Reason minister if you have any respiratory distress following an insect sting and call 911 08/05/22   Sebastian Beverley NOVAK, MD  insulin  glargine (LANTUS  SOLOSTAR) 100 UNIT/ML Solostar Pen INJECT 20 UNITS INTO THE SKIN DAILY = 75DAYS 03/31/23   McElwee, Lauren A, NP  levocetirizine (XYZAL ) 5 MG tablet     [provider]  metFORMIN  (GLUCOPHAGE -XR) 500 MG 24 hr tablet TAKE 2 TABLETS (1,000 MG TOTAL) BY MOUTH 2 (TWO) TIMES DAILY WITH A MEAL. 02/25/23 05/26/23  Webb, Padonda B, FNP  metoprolol  succinate (TOPROL -XL) 25 MG 24 hr tablet TAKE 1 TABLET (25 MG TOTAL) BY MOUTH DAILY. PATIENT MUST SCHEDULE APPOINTMENT FOR FUTURE REFILLS 12/22/21 12/22/22  Mona Vinie BROCKS, MD  omeprazole  (PRILOSEC OTC) 20 MG tablet Take 20 mg by mouth daily.    [provider]  ondansetron  (ZOFRAN -ODT) 4 MG disintegrating tablet Take 1 tablet (4 mg total) by mouth every 8 (eight) hours as needed for nausea or vomiting. 12/18/22   Sebastian Beverley NOVAK, MD  ONETOUCH ULTRA test strip 1 EACH BY IN VITRO ROUTE IN THE [place], AT NOON, AND AT BEDTIME 09/03/22   Sebastian Beverley NOVAK, MD  rosuvastatin  (CRESTOR ) 20 MG tablet TAKE 1 TABLET BY MOUTH EVERY DAY 03/25/23   Sebastian Beverley NOVAK, MD  scopolamine  (TRANSDERM-SCOP) 1 MG/3DAYS Place 1 patch (1.5 mg total) onto the skin every 3 (three) days. 08/03/22   Sebastian Beverley NOVAK, MD    Family History Family History  Problem Relation Age of Onset   Heart disease Mother    Heart disease Father    Diabetes Paternal Grandfather    Thyroid  disease Neg Hx     Social History Social History   Tobacco Use   Smoking status: Never    Passive exposure: Never   Smokeless tobacco: Never  Vaping Use   Vaping status: Never Used  Substance Use Topics   Alcohol use: No   Drug use: No     Allergies   Bee venom and Fluogen [influenza virus vaccine]   Review of Systems Review of Systems  Constitutional:  Positive for fever.  HENT:  Positive for  congestion and sore throat.   Respiratory:  Positive for cough.   Musculoskeletal:  Positive for myalgias.     Physical Exam Triage Vital Signs ED Triage Vitals [04/02/23 1524]  Encounter Vitals Group     BP 124/69     Systolic BP Percentile      Diastolic BP Percentile      Pulse Rate 82     Resp 15  Temp 98.4 F (36.9 C)     Temp Source Oral     SpO2 93 %     Weight      Height      Head Circumference      Peak Flow      Pain Score 0     Pain Loc      Pain Education      Exclude from Growth Chart    No data found.  Updated Vital Signs BP 124/69 (BP Location: Left Arm)   Pulse 82   Temp 98.4 F (36.9 C) (Oral)   Resp 15   LMP 12/24/2020   SpO2 93%   Visual Acuity Right Eye Distance:   Left Eye Distance:   Bilateral Distance:    Right Eye Near:   Left Eye Near:    Bilateral Near:     Physical Exam Vitals and nursing note reviewed.  Constitutional:      General: She is not in acute distress.    Appearance: She is well-developed. She is not ill-appearing.  HENT:     Head: Normocephalic and atraumatic.     Right Ear: Tympanic membrane and ear canal normal.     Left Ear: Tympanic membrane and ear canal normal.     Nose: Congestion present.     Mouth/Throat:     Mouth: Mucous membranes are moist.     Pharynx: Oropharynx is clear. Uvula midline. Posterior oropharyngeal erythema present.     Tonsils: No tonsillar exudate or tonsillar abscesses.  Eyes:     Conjunctiva/sclera: Conjunctivae normal.     Pupils: Pupils are equal, round, and reactive to light.  Cardiovascular:     Rate and Rhythm: Normal rate and regular rhythm.     Heart sounds: Normal heart sounds.  Pulmonary:     Effort: Pulmonary effort is normal.     Breath sounds: Normal breath sounds.  Musculoskeletal:     Cervical back: Normal range of motion and neck supple.  Lymphadenopathy:     Cervical: No cervical adenopathy.  Skin:    General: Skin is warm and dry.  Neurological:      General: No focal deficit present.     Mental Status: She is alert and oriented to person, place, and time.  Psychiatric:        Mood and Affect: Mood normal.        Behavior: Behavior normal.      UC Treatments / Results  Labs (all labs ordered are listed, but only abnormal results are displayed) Labs Reviewed  POCT INFLUENZA A/B - Abnormal; Notable for the following components:      Result Value   Influenza A, POC Positive (*)    All other components within normal limits   Comp Met (CMET) Order: [id number]  Status: Final result     Next appt: 04/12/2023 at 10:00 AM in Urology Ladena Manly, MD)     Dx: Upper abdominal pain   Test Result Released: Yes (seen)     Messages: Not Seen   0 Result Notes     1 Patient Communication     1 HM Topic          Component Ref Range & Units (hover) 3 mo ago (12/18/22) 8 mo ago (08/03/22) 3 yr ago (12/13/19) 3 yr ago (12/10/19) 3 yr ago (12/10/19) 3 yr ago (12/09/19) 3 yr ago (12/09/19)  Sodium 141 137 138 R 140 R 140 R 141 R 141 R  Potassium 4.0  3.9 4.2 R, CM 3.9 R 3.3 Low  R 3.7 R 3.7 R  Chloride 101 100 100 R 104 R 105 R 105 R   CO2 26 26 25  R 26 R 26 R 24 R   Glucose, Bld 172 High  200 High  195 High  CM 163 High  CM 118 High  CM 153 High  CM   BUN 12 10 13  R 6 R 6 R 8 R   Creatinine, Ser 0.52 0.45 0.65 R 0.50 R 0.62 R 0.57 R   Total Bilirubin 0.5 0.6 0.4 R      Alkaline Phosphatase 89 89 95 R      AST 27 24 117 High  R      ALT 34 30 148 High  R      Total Protein 7.5 7.0 7.7 R      Albumin 4.5 4.4 4.2 R      GFR 106.70 110.77 CM       Comment: Calculated using the CKD-EPI Creatinine Equation (2021)  Calcium  9.9 9.1 9.3 R 9.3 R 8.8 Low  R 8.2 Low  R   Resulting Agency [facility] [facility]        EKG   Radiology No results found.  Procedures Procedures (including critical care time)  Medications Ordered in UC Medications - No data  to display  Initial Impression / Assessment and Plan / UC Course  I have reviewed the triage vital signs and the nursing notes.  Pertinent labs & imaging results that were available during my care of the patient were reviewed by me and considered in my medical decision making (see chart for details).  Clinical Course as of 04/02/23 1556  Fri Apr 02, 2023  1541 O2 recheck 95% on room air  [JM]    Clinical Course User Index [JM] Loreda Myla SAUNDERS, NP    Reviewed exam and sx with patient, no red flags. +Flu A, start tamiflu , tessalon , and albuterol  inhaler. Encouraged rest and fluids. PCP follow-up if symptoms do not improve.  ER precautions reviewed and patient verbalized understanding.   Final Clinical Impressions(s) / UC Diagnoses   Final diagnoses:  Influenza A     Discharge Instructions      Start tamiflu  as prescribed. Tessalon  as needed for cough. Albuterol  inhaler as needed for shortness of breath. Lots of fluids and rest. Please follow up with your PCP if your symptoms do not improve. Please go to the ER for any worsening symptoms. I hope you feel better soon!     ED Prescriptions     Medication Sig Dispense Auth. Provider   oseltamivir  (TAMIFLU ) 75 MG capsule Take 1 capsule (75 mg total) by mouth every 12 (twelve) hours. 10 capsule Marleni Gallardo, Jodi R, NP   benzonatate  (TESSALON ) 200 MG capsule Take 1 capsule (200 mg total) by mouth 3 (three) times daily as needed. 20 capsule Fransisco Messmer, Jodi R, NP   albuterol  (VENTOLIN  HFA) 108 (90 Base) MCG/ACT inhaler Inhale 1-2 puffs into the lungs every 6 (six) hours as needed. 1 each Loreda Myla SAUNDERS, NP      PDMP not reviewed this encounter.   Loreda Myla SAUNDERS, NP 04/02/23 1556    Loreda Myla SAUNDERS, NP 04/02/23 [phone]

## 2023-04-02 NOTE — ED Triage Notes (Signed)
 Pt presents with c/o fever x 3 days and state she has taken Ibuprofen and Tylenol  for relief. States she has chest congestion and runny nose.

## 2023-04-02 NOTE — Discharge Instructions (Signed)
 Start tamiflu  as prescribed. Tessalon  as needed for cough. Albuterol  inhaler as needed for shortness of breath. Lots of fluids and rest. Please follow up with your PCP if your symptoms do not improve. Please go to the ER for any worsening symptoms. I hope you feel better soon!

## 2023-04-11 ENCOUNTER — Other Ambulatory Visit: Payer: Self-pay | Admitting: Family Medicine

## 2023-04-11 DIAGNOSIS — E1165 Type 2 diabetes mellitus with hyperglycemia: Secondary | ICD-10-CM

## 2023-04-12 ENCOUNTER — Ambulatory Visit: Payer: BC Managed Care – PPO | Admitting: Urology

## 2023-04-12 NOTE — Progress Notes (Deleted)
 Assessment: 1. Gross hematuria     Plan: I personally reviewed the patient's chart including provider notes, and lab results. Today I had a discussion with the patient regarding the findings of gross hematuria including the implications and differential diagnoses associated with it.  I also discussed recommendations for further evaluation including the rationale for upper tract imaging and cystoscopy.  I discussed the nature of these procedures including potential risk and complications.  The patient expressed an understanding of these issues.    Chief Complaint: No chief complaint on file.   History of Present Illness:  Dana Rojas is a 53 y.o. female who is seen in consultation from [place], PA-C for evaluation of gross hematuria. She was recently seen by her PCP in January 2025 for evaluation of dysuria, frequency, and gross hematuria. Urinalysis from 03/10/2023 showed TNTC RBCs, 11-20 WBCs. Urine culture showed <10K gram-negative species. She was treated with cephalexin x 5 days.    Past Medical History:  Past Medical History:  Diagnosis Date   Allergy    Atrial fibrillation with tachycardic ventricular rate (HCC) 12/08/2019   Seasonal allergies    Vitamin D deficiency     Past Surgical History:  Past Surgical History:  Procedure Laterality Date   HERNIA REPAIR     HERNIA REPAIR     X2   LEFT HEART CATH AND CORONARY ANGIOGRAPHY N/A 12/11/2019   Procedure: LEFT HEART CATH AND CORONARY ANGIOGRAPHY;  Surgeon: Runell Gess, MD;  Location: [facility];  Service: Cardiovascular;  Laterality: N/A;   TUBAL LIGATION      Allergies:  Allergies  Allergen Reactions   Bee Venom     Body swelling   Fluogen [Influenza Virus Vaccine] Diarrhea and Nausea And Vomiting    Family History:  Family History  Problem Relation Age of Onset   Heart disease Mother    Heart disease Father    Diabetes Paternal Grandfather    Thyroid disease Neg Hx      Social History:  Social History   Tobacco Use   Smoking status: Never    Passive exposure: Never   Smokeless tobacco: Never  Vaping Use   Vaping status: Never Used  Substance Use Topics   Alcohol use: No   Drug use: No    Review of symptoms:  Constitutional:  Negative for unexplained weight loss, night sweats, fever, chills ENT:  Negative for nose bleeds, sinus pain, painful swallowing CV:  Negative for chest pain, shortness of breath, exercise intolerance, palpitations, loss of consciousness Resp:  Negative for cough, wheezing, shortness of breath GI:  Negative for nausea, vomiting, diarrhea, bloody stools GU:  Positives noted in HPI; otherwise negative for urinary incontinence Neuro:  Negative for seizures, poor balance, limb weakness, slurred speech Psych:  Negative for lack of energy, depression, anxiety Endocrine:  Negative for polydipsia, polyuria, symptoms of hypoglycemia (dizziness, hunger, sweating) Hematologic:  Negative for anemia, purpura, petechia, prolonged or excessive bleeding, use of anticoagulants  Allergic:  Negative for difficulty breathing or choking as a result of exposure to anything; no shellfish allergy; no allergic response (rash/itch) to materials, foods  Physical exam: LMP 12/24/2020  GENERAL APPEARANCE:  Well appearing, well developed, well nourished, NAD HEENT: Atraumatic, Normocephalic, oropharynx clear. NECK: Supple without lymphadenopathy or thyromegaly. LUNGS: Clear to auscultation bilaterally. HEART: Regular Rate and Rhythm without murmurs, gallops, or rubs. ABDOMEN: Soft, non-tender, No Masses. EXTREMITIES: Moves all extremities well.  Without clubbing, cyanosis, or edema. NEUROLOGIC:  Alert and  oriented x 3, normal gait, CN II-XII grossly intact.  MENTAL STATUS:  Appropriate. BACK:  Non-tender to palpation.  No CVAT SKIN:  Warm, dry and intact.    Results: U/A:

## 2023-04-20 ENCOUNTER — Other Ambulatory Visit: Payer: Self-pay | Admitting: Nurse Practitioner

## 2023-04-20 DIAGNOSIS — B009 Herpesviral infection, unspecified: Secondary | ICD-10-CM

## 2023-04-21 NOTE — Telephone Encounter (Signed)
 Med refill request: Valtrex 500mg   Last AEX: 10/20/22 Next AEX: not scheduled  Last MMG (if hormonal med) 04/17/22 birads cat 2 benign (care everywhere) Refill authorized: Last rx 01/13/22 #30 with 1 refill. Please approve or deny as appropriate.

## 2023-05-29 ENCOUNTER — Other Ambulatory Visit: Payer: Self-pay | Admitting: Family

## 2023-05-29 DIAGNOSIS — E1165 Type 2 diabetes mellitus with hyperglycemia: Secondary | ICD-10-CM

## 2023-06-09 ENCOUNTER — Telehealth: Payer: Self-pay | Admitting: Family Medicine

## 2023-06-09 DIAGNOSIS — E1165 Type 2 diabetes mellitus with hyperglycemia: Secondary | ICD-10-CM

## 2023-06-09 DIAGNOSIS — E785 Hyperlipidemia, unspecified: Secondary | ICD-10-CM

## 2023-06-09 NOTE — Telephone Encounter (Signed)
 Copied from CRM [id number]. Topic: Clinical - Medication Refill >> Jun 09, 2023 10:30 AM Elita Guitar wrote: Most Recent Primary Care Visit:  Provider: Trenton Frock  Department: [facility]  Visit Type: ACUTE  Date: 03/10/2023  Medication: METFORMIN & CHOLESTEROL (patient did not know the name)  Has the patient contacted their pharmacy? No   Is this the correct pharmacy for this prescription? Yes If no, delete pharmacy and type the correct one.  This is the patient's preferred pharmacy:  [id number] [place], [place] - [address] [place] [address] Phone: [phone] Fax: [phone]   Has the prescription been filled recently? Yes  Is the patient out of the medication? Yes  Has the patient been seen for an appointment in the last year OR does the patient have an upcoming appointment? Yes  Can we respond through MyChart? Yes  Agent: Please be advised that Rx refills may take up to 3 business days. We ask that you follow-up with your pharmacy.

## 2023-06-10 MED ORDER — ROSUVASTATIN CALCIUM 20 MG PO TABS: 20.0000 mg | ORAL_TABLET | Freq: Every day | ORAL | 0 refills | Status: DC

## 2023-06-10 MED ORDER — METFORMIN HCL ER 500 MG PO TB24: 1000.0000 mg | ORAL_TABLET | Freq: Two times a day (BID) | ORAL | 0 refills | Status: DC

## 2023-06-10 NOTE — Telephone Encounter (Signed)
 Metformin and rosuvastatin refilled.

## 2023-06-10 NOTE — Addendum Note (Signed)
 Addended by: Alejos Husband on: 06/10/2023 10:17 AM   Modules accepted: Orders

## 2023-06-23 ENCOUNTER — Encounter: Payer: Self-pay | Admitting: Family Medicine

## 2023-06-23 ENCOUNTER — Ambulatory Visit: Admitting: Family Medicine

## 2023-06-23 VITALS — BP 124/86 | HR 86 | Temp 97.5°F | Ht 62.0 in | Wt 173.4 lb

## 2023-06-23 DIAGNOSIS — E6609 Other obesity due to excess calories: Secondary | ICD-10-CM

## 2023-06-23 DIAGNOSIS — E66811 Obesity, class 1: Secondary | ICD-10-CM

## 2023-06-23 DIAGNOSIS — B001 Herpesviral vesicular dermatitis: Secondary | ICD-10-CM

## 2023-06-23 DIAGNOSIS — R42 Dizziness and giddiness: Secondary | ICD-10-CM

## 2023-06-23 DIAGNOSIS — Z1231 Encounter for screening mammogram for malignant neoplasm of breast: Secondary | ICD-10-CM

## 2023-06-23 DIAGNOSIS — E1165 Type 2 diabetes mellitus with hyperglycemia: Secondary | ICD-10-CM

## 2023-06-23 DIAGNOSIS — Z1159 Encounter for screening for other viral diseases: Secondary | ICD-10-CM

## 2023-06-23 DIAGNOSIS — L501 Idiopathic urticaria: Secondary | ICD-10-CM | POA: Insufficient documentation

## 2023-06-23 DIAGNOSIS — E785 Hyperlipidemia, unspecified: Secondary | ICD-10-CM | POA: Diagnosis not present

## 2023-06-23 DIAGNOSIS — Z23 Encounter for immunization: Secondary | ICD-10-CM | POA: Diagnosis not present

## 2023-06-23 DIAGNOSIS — J309 Allergic rhinitis, unspecified: Secondary | ICD-10-CM | POA: Insufficient documentation

## 2023-06-23 DIAGNOSIS — Z0000 Encounter for general adult medical examination without abnormal findings: Secondary | ICD-10-CM

## 2023-06-23 DIAGNOSIS — J302 Other seasonal allergic rhinitis: Secondary | ICD-10-CM

## 2023-06-23 DIAGNOSIS — L508 Other urticaria: Secondary | ICD-10-CM | POA: Diagnosis not present

## 2023-06-23 DIAGNOSIS — Z6831 Body mass index (BMI) 31.0-31.9, adult: Secondary | ICD-10-CM

## 2023-06-23 DIAGNOSIS — K219 Gastro-esophageal reflux disease without esophagitis: Secondary | ICD-10-CM

## 2023-06-23 LAB — POCT GLYCOSYLATED HEMOGLOBIN (HGB A1C): Hemoglobin A1C: 13.1 % — AB (ref 4.0–5.6)

## 2023-06-23 MED ORDER — METFORMIN HCL ER 500 MG PO TB24: 1000.0000 mg | ORAL_TABLET | Freq: Two times a day (BID) | ORAL | 3 refills | Status: AC

## 2023-06-23 MED ORDER — TIRZEPATIDE 2.5 MG/0.5ML ~~LOC~~ SOAJ: 2.5000 mg | SUBCUTANEOUS | 0 refills | Status: DC

## 2023-06-23 MED ORDER — VALACYCLOVIR HCL 500 MG PO TABS: ORAL_TABLET | ORAL | 1 refills | Status: AC

## 2023-06-23 MED ORDER — LEVOCETIRIZINE DIHYDROCHLORIDE 5 MG PO TABS
5.0000 mg | ORAL_TABLET | Freq: Every evening | ORAL | 3 refills | Status: AC
Start: 2023-06-23 — End: 2024-06-17

## 2023-06-23 MED ORDER — OMEPRAZOLE 40 MG PO CPDR: 40.0000 mg | DELAYED_RELEASE_CAPSULE | Freq: Every day | ORAL | 3 refills | Status: AC

## 2023-06-23 MED ORDER — SCOPOLAMINE 1 MG/3DAYS TD PT72: 1.0000 | MEDICATED_PATCH | TRANSDERMAL | 12 refills | Status: AC

## 2023-06-23 MED ORDER — LANTUS SOLOSTAR 100 UNIT/ML ~~LOC~~ SOPN: 20.0000 [IU] | PEN_INJECTOR | Freq: Every day | SUBCUTANEOUS | 3 refills | Status: DC

## 2023-06-23 MED ORDER — FEXOFENADINE HCL 180 MG PO TABS: 180.0000 mg | ORAL_TABLET | Freq: Every morning | ORAL | 3 refills | Status: AC

## 2023-06-23 MED ORDER — FREESTYLE LIBRE 3 PLUS SENSOR MISC: 11 refills | Status: DC

## 2023-06-23 MED ORDER — ROSUVASTATIN CALCIUM 20 MG PO TABS
20.0000 mg | ORAL_TABLET | Freq: Every day | ORAL | 3 refills | Status: AC
Start: 2023-06-23 — End: 2024-06-17

## 2023-06-23 NOTE — Patient Instructions (Signed)
  VISIT SUMMARY: Today, you had your annual physical exam and diabetes management visit. We reviewed your current health status, including your diabetes, blood pressure, cholesterol levels, and other ongoing health issues. We also discussed your medications, lifestyle recommendations, and necessary vaccinations.  YOUR PLAN: -TYPE 2 DIABETES MELLITUS WITH HYPERGLYCEMIA: Your blood sugar levels are higher than desired, with a current A1c of 13%. We discussed starting a new medication called Mounjaro (tirzepatide) to help control your blood sugar and assist with weight loss. You should continue taking metformin  and Lantus  insulin  as prescribed. We also recommend regular aerobic exercise and a low-carb diet. We will refer you to an endocrinologist and a pharmacy team for further management and suggest using a continuous glucose monitor like Freestyle Libre 3 Plus to better track your blood sugar levels.  -HYPERTENSION: Your blood pressure is currently well-controlled at 124/86 mmHg. We discussed possibly using ACE inhibitors or ARBs for better blood pressure management and kidney protection. We will also consider the benefits of GLP-1 receptor agonists for your blood pressure.  -HYPERLIPIDEMIA: Your cholesterol levels are being managed with rosuvastatin . We will check your lipid panel to ensure it remains under control. Continue taking rosuvastatin  as prescribed.  -OBESITY: Your BMI is 31.7, which classifies as obesity. We discussed using Mounjaro (tirzepatide) to help with weight loss. Regular aerobic exercise and a low-carb diet are also recommended to help reduce your weight and improve your overall health.  -GERD: You have gastroesophageal reflux disease (GERD), which causes stomach tenderness and heartburn. Continue taking omeprazole as needed to manage your symptoms.  -CHRONIC VERTIGO: You experience chronic dizziness, which has been helped by using transdermal scopolamine  patches. We will recheck  your vitamin D  levels to ensure they are adequate. Continue using the scopolamine  patches as needed.  -HERPES LABIALIS: You have herpes labialis, which causes cold sores. Continue taking valacyclovir  during outbreaks to manage your symptoms.  -WELLNESS VISIT: We conducted your annual physical exam and discussed your overall health maintenance. We will order an annual mammogram for breast cancer screening, refer you to an ophthalmologist for an annual eye exam, and perform routine hepatitis C screening.  -GENERAL HEALTH MAINTENANCE: You are due for several vaccinations, including COVID-19, shingles, pneumonia, and tetanus. We discussed the importance of these vaccines and addressed your concerns about past adverse reactions. We will administer these vaccines to keep you protected.  INSTRUCTIONS: Please schedule a follow-up appointment in one month to assess your response to the new medication and overall health status. Additionally, we will order a hemoglobin A1c test, complete metabolic panel, urine microalbumin/creatinine ratio,  lipid panel, and vitamin D  level. Make sure to get your annual mammogram, eye exam, and hepatitis C screening as discussed.

## 2023-06-23 NOTE — Progress Notes (Signed)
 Assessment  Assessment/Plan:   Assessment & Plan Type 2 diabetes mellitus with hyperglycemia Chronic condition with poor glycemic control, current A1c of 13 (previously 9.7 in October 2024). High risk for cardiovascular and renal complications. Discussed GLP-1 receptor agonists like Mounjaro (tirzepatide) for glycemic control and weight loss, with potential side effects including nausea. Consider continuous glucose monitoring with Freestyle Libre 3 Plus. Referral to endocrinology and pharmacy team for management. Emphasized aerobic exercise and low carb diet. Consider SGLT2 inhibitors if criteria are met: no recurrent UTIs, A1c <9, no urinary ketones. - Order hemoglobin A1c test, complete metabolic panel, and urine microalbumin/creatinine ratio - Prescribe Mounjaro (tirzepatide) 2.5 mg once weekly - Continue metformin  1000 mg BID and Lantus  insulin  20 units daily - Refer to endocrinology and [facility] care management pharmacy team - Recommend aerobic exercise and low carb diet - Recommend continuous glucose monitoring with Freestyle Libre 3 Plus  Hypertension Blood pressure is 124/86 mmHg. Previous use of metoprolol , current status unclear. Discussed ACE inhibitors or ARBs for blood pressure management and renal protection. Consider GLP-1 receptor agonists for additional benefits.  Hyperlipidemia Managed with rosuvastatin  20 mg daily. Plan to check lipid panel. - Order lipid panel - Continue rosuvastatin  20 mg daily  Obesity BMI is 31.7. Discussed GLP-1 receptor agonists like Mounjaro (tirzepatide) for weight loss, with potential side effects including nausea. Encouraged weight loss to reduce mortality risk. Consider GLP-1 receptor agonists pending insurance coverage. - Prescribe Mounjaro (tirzepatide) 2.5 mg once weekly - Recommend aerobic exercise and low carb diet  GERD Managed with over-the-counter omeprazole 20 mg as needed. Symptoms include stomach tenderness,  likely heartburn-related. - Increase omeprazole 40 mg daily - Consider referral to gastroenterology if symptoms persist  Chronic vertigo Previously associated with low vitamin D  levels. Managed with transdermal scopolamine  patches, providing relief. Plan to recheck vitamin D  levels. - Order vitamin D  level - Continue transdermal scopolamine  patches 1 mg every 72 hours as needed  Herpes labialis Managed with valacyclovir  during outbreaks. - Prescribe valacyclovir  for outbreaks  Wellness Visit Annual physical examination conducted. Discussed health maintenance and screening needs. - Order annual mammogram for breast cancer screening - Refer to ophthalmology for annual eye exam - Perform routine hepatitis C screening  General Health Maintenance Due for vaccinations and screenings. Discussed importance and potential side effects of vaccines, addressed concerns about previous adverse reactions to flu and COVID-19 vaccines. - Administer COVID-19 vaccine (2024-2025), shingles vaccine, pneumonia vaccine, and tetanus vaccine  Follow-up Plan to assess response to new medication and overall health status. - Schedule follow-up appointment in one month     Medications Discontinued During This Encounter  Medication Reason   oseltamivir  (TAMIFLU ) 75 MG capsule    ondansetron  (ZOFRAN -ODT) 4 MG disintegrating tablet    benzonatate  (TESSALON ) 200 MG capsule    cyclobenzaprine  (FLEXERIL ) 5 MG tablet    cetirizine (ZYRTEC) 10 MG tablet    levocetirizine (XYZAL) 5 MG tablet    metoprolol  succinate (TOPROL -XL) 25 MG 24 hr tablet    acetaminophen  (TYLENOL ) 325 MG tablet    omeprazole (PRILOSEC OTC) 20 MG tablet    scopolamine  (TRANSDERM-SCOP) 1 MG/3DAYS Reorder   insulin  glargine (LANTUS  SOLOSTAR) 100 UNIT/ML Solostar Pen Reorder   valACYclovir  (VALTREX ) 500 MG tablet Reorder   metFORMIN  (GLUCOPHAGE -XR) 500 MG 24 hr tablet Reorder   rosuvastatin  (CRESTOR ) 20 MG tablet Reorder   fexofenadine  (ALLEGRA) 180 MG tablet Reorder    Patient Counseling(The following topics were reviewed and/or handout was given):  -  Nutrition: Stressed importance of moderation in sodium/caffeine intake, saturated fat and cholesterol, caloric balance, sufficient intake of fresh fruits, vegetables, and fiber.  -Stressed the importance of regular exercise.   -Substance Abuse: Discussed cessation/primary prevention of tobacco, alcohol, or other drug use; driving or other dangerous activities under the influence; availability of treatment for abuse.   -Injury prevention: Discussed safety belts, safety helmets, smoke detector, smoking near bedding or upholstery.   -Sexuality: Discussed sexually transmitted diseases, partner selection, use of condoms, avoidance of unintended pregnancy and contraceptive alternatives.   -Dental health: Discussed importance of regular tooth brushing, flossing, and dental visits.  -Health maintenance and immunizations reviewed. Please refer to Health maintenance section.  Return in about 1 month (around 07/23/2023) for DM.        Subjective:   Encounter date: 06/23/2023  Chief Complaint  Patient presents with   Annual Exam    Refill on scopolamine  patches for cruise in June    Discussed the use of AI scribe software for clinical note transcription with the patient, who gave verbal consent to proceed.  History of Present Illness Dana Rojas is a 53 year old female with type 2 diabetes who presents for an annual physical exam and diabetes management.  She has a history of type 2 diabetes with hyperglycemia. Her hemoglobin A1c was last recorded at 9.7% in October 2024, and today it is 13%. Her home blood sugars have been running high, typically between 180 to 200 mg/dL, with a recent reading of 340 mg/dL. She uses Lantus  insulin  20 units once daily and metformin  1000 mg twice daily. No episodes of hypoglycemia, but she experiences frequent nocturia.  She has  hyperlipidemia and is currently taking rosuvastatin  20 mg daily.  She experiences chronic vertigo, previously associated with low vitamin D  levels. She uses transdermal scopolamine  patches, 1 mg every 72 hours as needed, which provide relief. She continues to experience dizziness and is planning a cruise in June, for which she finds the patches very helpful.  She has a history of hypertension and has used metoprolol  in the past, though it is unclear if she is currently taking it.  She has GERD and takes omeprazole 20 mg over the counter as needed, which alleviates her symptoms. She describes her stomach as 'always tender', particularly in the lower abdomen.  She has herpes labialis and takes valacyclovir  as needed during outbreaks.  She has a history of atrial fibrillation and has used metoprolol  in the past, but she denies any recent irregular heart rates or chest pain.  She has a BMI of 31.7, indicating obesity. No family history of thyroid  cancers or multiple endocrine neoplasia type 2, and no personal history of liver or gallbladder disease, or gastroparesis.  She has allergies and takes fexofenadine 180 mg in the morning and levocetirizine 5 mg at night to manage symptoms, which include urticaria if not taken.  She is due for several vaccinations, including COVID-19, shingles, pneumonia, and tetanus. She is concerned about past adverse reactions to vaccines, including a hospitalization following a flu shot.   Fibrosis 4 Score = .7 (Low risk)        Interpretation for patients with NAFLD          <1.30       -  F0-F1 (Low risk)          1.30-2.67 -  Indeterminate           >2.67      -  F3-F4 (High risk)  Validated for ages 46-65      Score is based on outdated labs. ALT, AST, and platelets should all be measured within the last 6 months for an accurate FIB-4 Score      08/03/2022   10:31 AM 04/29/2016    4:14 PM 09/01/2014    3:40 PM  Depression screen PHQ 2/9  Decreased  Interest 0 0 0  Down, Depressed, Hopeless 0 0 0  PHQ - 2 Score 0 0 0  Altered sleeping 0    Tired, decreased energy 0    Change in appetite 0    Feeling bad or failure about yourself  0    Trouble concentrating 0    Moving slowly or fidgety/restless 0    Suicidal thoughts 0    PHQ-9 Score 0    Difficult doing work/chores Not difficult at all         08/03/2022   10:31 AM  GAD 7 : Generalized Anxiety Score  Nervous, Anxious, on Edge 0  Control/stop worrying 0  Worry too much - different things 0  Trouble relaxing 0  Restless 0  Easily annoyed or irritable 0  Afraid - awful might happen 0  Total GAD 7 Score 0  Anxiety Difficulty Not difficult at all    Health Maintenance Due  Topic Date Due   OPHTHALMOLOGY EXAM  Never done   Hepatitis C Screening  Never done   COVID-19 Vaccine (1 - 2024-25 season) Never done       PMH:  The following were reviewed and entered/updated in epic: Past Medical History:  Diagnosis Date   Allergy    Atrial fibrillation with tachycardic ventricular rate (HCC) 12/08/2019   Seasonal allergies    Vitamin D  deficiency     Patient Active Problem List   Diagnosis Date Noted   Recurrent herpes labialis 06/23/2023   Chronic urticaria 06/23/2023   Seasonal allergic rhinitis 06/23/2023   Upper abdominal pain 12/18/2022   Vertigo 08/05/2022   Type 2 diabetes mellitus with hyperglycemia, without long-term current use of insulin  (HCC) 08/05/2022   Hymenoptera allergy 08/05/2022   Gastroesophageal reflux disease 08/18/2021   Sensorineural hearing loss (SNHL), bilateral 08/18/2021   Tinnitus of both ears 08/18/2021   Family history of coronary artery disease 02/12/2020   Hypokalemia 12/09/2019   Gastroenteritis 12/09/2019   Hyperglycemia 12/09/2019   Atrial fibrillation with RVR (HCC) 12/09/2019   Troponin level elevated 12/09/2019   Flash pulmonary edema (HCC)    New onset a-fib (HCC) 12/08/2019   Bradycardia, sinus 12/08/2019    Non-intractable vomiting 12/08/2019   Sciatica of left side 08/05/2016   Hyperthyroidism 12/20/2014   Intramural leiomyoma of uterus 02/02/2014   Menorrhagia with regular cycle 02/02/2014   Hyperlipidemia 08/12/2009   OBESITY 08/12/2009   GESTATIONAL DIABETES 08/12/2009   SCOLIOSIS 08/12/2009   HEADACHE 08/12/2009   CHICKENPOX, HX OF 08/12/2009    Past Surgical History:  Procedure Laterality Date   HERNIA REPAIR     HERNIA REPAIR     X2   LEFT HEART CATH AND CORONARY ANGIOGRAPHY N/A 12/11/2019   Procedure: LEFT HEART CATH AND CORONARY ANGIOGRAPHY;  Surgeon: Avanell Leigh, MD;  Location: [facility];  Service: Cardiovascular;  Laterality: N/A;   TUBAL LIGATION      Family History  Problem Relation Age of Onset   Heart disease Mother    Heart disease Father    Diabetes Paternal Grandfather    Thyroid  disease Neg Hx  Medications- reviewed and updated Outpatient Medications Prior to Visit  Medication Sig Dispense Refill   albuterol  (VENTOLIN  HFA) 108 (90 Base) MCG/ACT inhaler Inhale 1-2 puffs into the lungs every 6 (six) hours as needed. 1 each 0   B-D UF III MINI PEN NEEDLES 31G X 5 MM MISC 1 EACH BY DOES NOT APPLY ROUTE 3 (THREE) TIMES DAILY WITH MEALS. 100 each 0   Blood Glucose Monitoring Suppl DEVI 1 each by Does not apply route in the morning, at noon, and at bedtime. May substitute to any manufacturer covered by patient's insurance. 1 each 0   EPINEPHrine  0.3 mg/0.3 mL IJ SOAJ injection Reason minister if you have any respiratory distress following an insect sting and call 911 1 each 2   ONETOUCH ULTRA test strip 1 EACH BY IN VITRO ROUTE IN THE MORNING, AT NOON, AND AT BEDTIME 100 strip 0   fexofenadine (ALLEGRA) 180 MG tablet Take 180 mg by mouth every morning.     insulin  glargine (LANTUS  SOLOSTAR) 100 UNIT/ML Solostar Pen INJECT 20 UNITS INTO THE SKIN DAILY = 75DAYS 22.5 mL 1   metFORMIN  (GLUCOPHAGE -XR) 500 MG 24 hr tablet Take 2 tablets (1,000 mg  total) by mouth 2 (two) times daily with a meal. 360 tablet 0   omeprazole (PRILOSEC OTC) 20 MG tablet Take 20 mg by mouth daily.     rosuvastatin  (CRESTOR ) 20 MG tablet Take 1 tablet (20 mg total) by mouth daily. 90 tablet 0   scopolamine  (TRANSDERM-SCOP) 1 MG/3DAYS Place 1 patch (1.5 mg total) onto the skin every 3 (three) days. 10 patch 12   valACYclovir  (VALTREX ) 500 MG tablet TAKE 1 TABLET TWICE DAILY AT FIRST SIGN OF OUTBREAK FOR 3 TO 5 DAYS. 30 tablet 1   acetaminophen  (TYLENOL ) 325 MG tablet Take 2 tablets (650 mg total) by mouth every 6 (six) hours as needed for moderate pain. (Patient not taking: Reported on 06/23/2023) 30 tablet 0   benzonatate  (TESSALON ) 200 MG capsule Take 1 capsule (200 mg total) by mouth 3 (three) times daily as needed. 20 capsule 0   cetirizine (ZYRTEC) 10 MG tablet Take 10 mg by mouth daily.     cyclobenzaprine  (FLEXERIL ) 5 MG tablet Take 1 tablet (5 mg total) by mouth 3 (three) times daily as needed for muscle spasms. 30 tablet 0   levocetirizine (XYZAL) 5 MG tablet      metoprolol  succinate (TOPROL -XL) 25 MG 24 hr tablet TAKE 1 TABLET (25 MG TOTAL) BY MOUTH DAILY. PATIENT MUST SCHEDULE APPOINTMENT FOR FUTURE REFILLS 90 tablet 0   ondansetron  (ZOFRAN -ODT) 4 MG disintegrating tablet Take 1 tablet (4 mg total) by mouth every 8 (eight) hours as needed for nausea or vomiting. 20 tablet 0   oseltamivir  (TAMIFLU ) 75 MG capsule Take 1 capsule (75 mg total) by mouth every 12 (twelve) hours. 10 capsule 0   No facility-administered medications prior to visit.    Allergies  Allergen Reactions   Bee Venom     Body swelling   Fluogen [Influenza Virus Vaccine] Diarrhea and Nausea And Vomiting    Social History   Socioeconomic History   Marital status: Married    Spouse name: Not on file   Number of children: Not on file   Years of education: Not on file   Highest education level: Not on file  Occupational History   Not on file  Tobacco Use   Smoking status:  Never    Passive exposure: Never   Smokeless tobacco: Never  Vaping Use   Vaping status: Never Used  Substance and Sexual Activity   Alcohol use: No   Drug use: No   Sexual activity: Yes    Partners: Male    Birth control/protection: Surgical    Comment: tubal ligation   Other Topics Concern   Not on file  Social History Narrative   Not on file   Social Drivers of Health   Financial Resource Strain: Not on file  Food Insecurity: Not on file  Transportation Needs: Not on file  Physical Activity: Not on file  Stress: Not on file  Social Connections: Not on file       Lab Results  Component Value Date   HGBA1C 13.1 (A) 06/23/2023       Objective:  Physical Exam: BP 124/86   Pulse 86   Temp (!) 97.5 F (36.4 C) (Temporal)   Ht 5\' 2"  (1.575 m)   Wt 173 lb 6.4 oz (78.7 kg)   LMP 12/24/2020   SpO2 100%   BMI 31.72 kg/m   Body mass index is 31.72 kg/m. Wt Readings from Last 3 Encounters:  06/23/23 173 lb 6.4 oz (78.7 kg)  03/10/23 177 lb 8 oz (80.5 kg)  12/18/22 170 lb 9.6 oz (77.4 kg)     Physical Exam VITALS: BP- 124/86 MEASUREMENTS: BMI- 31.7. GENERAL: Alert, cooperative, well developed, no acute distress HEENT: Normocephalic, normal oropharynx, moist mucous membranes CHEST: Clear to auscultation bilaterally, no wheezes, rhonchi, or crackles CARDIOVASCULAR: Normal heart rate and rhythm, S1 and S2 normal without murmurs ABDOMEN: Soft, non-tender, non-distended, without organomegaly, normal bowel sounds EXTREMITIES: No cyanosis or edema NEUROLOGICAL: Cranial nerves grossly intact, moves all extremities without gross motor or sensory deficit  Physical Exam Constitutional:      General: She is not in acute distress.    Appearance: Normal appearance. She is not ill-appearing or toxic-appearing.  HENT:     Head: Normocephalic and atraumatic.     Right Ear: Hearing, tympanic membrane, ear canal and external ear normal. There is no impacted cerumen.      Left Ear: Hearing, tympanic membrane, ear canal and external ear normal. There is no impacted cerumen.     Nose: Nose normal. No congestion.     Mouth/Throat:     Lips: No lesions.     Mouth: Mucous membranes are moist.     Pharynx: Oropharynx is clear. No oropharyngeal exudate.  Eyes:     General: No scleral icterus.       Right eye: No discharge.        Left eye: No discharge.     Conjunctiva/sclera: Conjunctivae normal.     Pupils: Pupils are equal, round, and reactive to light.  Neck:     Thyroid : No thyroid  mass, thyromegaly or thyroid  tenderness.  Cardiovascular:     Rate and Rhythm: Normal rate and regular rhythm.     Pulses: Normal pulses.     Heart sounds: Normal heart sounds.  Pulmonary:     Effort: Pulmonary effort is normal. No respiratory distress.     Breath sounds: Normal breath sounds.  Abdominal:     General: Abdomen is flat. Bowel sounds are normal.     Palpations: Abdomen is soft.  Musculoskeletal:        General: Normal range of motion.     Cervical back: Normal range of motion.     Right lower leg: No edema.     Left lower leg: No edema.  Lymphadenopathy:  Cervical: No cervical adenopathy.  Skin:    General: Skin is warm and dry.     Findings: No rash.  Neurological:     General: No focal deficit present.     Mental Status: She is alert and oriented to person, place, and time. Mental status is at baseline.     Deep Tendon Reflexes:     Reflex Scores:      Patellar reflexes are 2+ on the right side and 2+ on the left side. Psychiatric:        Mood and Affect: Mood normal.        Behavior: Behavior normal.        Thought Content: Thought content normal.        Judgment: Judgment normal.         Diabetic foot exam was performed with the following findings:   No deformities, ulcerations, or other skin breakdown Normal sensation of 10g monofilament Intact posterior tibialis and dorsalis pedis pulses     Prior labs:   Recent Results (from  the past 2160 hours)  POCT Influenza A/B     Status: Abnormal   Collection Time: 04/02/23  3:41 PM  Result Value Ref Range   Influenza A, POC Positive (A) Negative   Influenza B, POC Negative Negative  POCT glycosylated hemoglobin (Hb A1C)     Status: Abnormal   Collection Time: 06/23/23  4:15 PM  Result Value Ref Range   Hemoglobin A1C 13.1 (A) 4.0 - 5.6 %   HbA1c POC (<> result, manual entry)     HbA1c, POC (prediabetic range)     HbA1c, POC (controlled diabetic range)      Lab Results  Component Value Date   CHOL 198 08/03/2022   CHOL 285 (H) 12/09/2019   CHOL 236 (H) 11/08/2014   Lab Results  Component Value Date   HDL 45.50 08/03/2022   HDL 34 (L) 12/09/2019   HDL 34 (L) 11/08/2014   Lab Results  Component Value Date   LDLCALC 207 (H) 12/09/2019   LDLCALC 158 (H) 11/08/2014   Lab Results  Component Value Date   TRIG 235.0 (H) 08/03/2022   TRIG 219 (H) 12/09/2019   TRIG 219 (H) 11/08/2014   Lab Results  Component Value Date   CHOLHDL 4 08/03/2022   CHOLHDL 8.4 12/09/2019   CHOLHDL 6.9 (H) 11/08/2014   Lab Results  Component Value Date   LDLDIRECT 130.0 08/03/2022   LDLDIRECT 156.4 08/08/2009    Last metabolic panel Lab Results  Component Value Date   GLUCOSE 172 (H) 12/18/2022   NA 141 12/18/2022   K 4.0 12/18/2022   CL 101 12/18/2022   CO2 26 12/18/2022   BUN 12 12/18/2022   CREATININE 0.52 12/18/2022   GFR 106.70 12/18/2022   CALCIUM  9.9 12/18/2022   PROT 7.5 12/18/2022   ALBUMIN 4.5 12/18/2022   BILITOT 0.5 12/18/2022   ALKPHOS 89 12/18/2022   AST 27 12/18/2022   ALT 34 12/18/2022   ANIONGAP 13 12/13/2019    Lab Results  Component Value Date   HGBA1C 13.1 (A) 06/23/2023    Last CBC Lab Results  Component Value Date   WBC 7.1 12/18/2022   HGB 14.2 12/18/2022   HCT 44.7 12/18/2022   MCV 79.2 12/18/2022   MCH 26.5 12/13/2019   RDW 14.0 12/18/2022   PLT 350.0 12/18/2022    Lab Results  Component Value Date   TSH 0.36  08/03/2022    No results found for: "  PSA1", "PSA"  Last vitamin D  No results found for: "25OHVITD2", "25OHVITD3", "VD25OH"  Lab Results  Component Value Date   COLORU straw (A) 03/10/2023   CLARITYU cloudy (A) 03/10/2023   GLUCOSEUR >=1,000 (A) 03/10/2023   BILIRUBINUR negative 03/10/2023   SPECGRAV 1.025 03/10/2023   RBCUR large (A) 03/10/2023   PHUR 5.0 03/10/2023   PROTEINUR =30 (A) 03/10/2023   UROBILINOGEN 0.2 03/10/2023   LEUKOCYTESUR Moderate (2+) (A) 03/10/2023    Lab Results  Component Value Date   MICROALBUR 3.1 (H) 08/03/2022     At today's visit, we discussed treatment options, associated risk and benefits, and engage in counseling as needed.  Additionally the following were reviewed: Past medical records, past medical and surgical history, family and social background, as well as relevant laboratory results, imaging findings, and specialty notes, where applicable.  This message was generated using dictation software, and as a result, it may contain unintentional typos or errors.  Nevertheless, extensive effort was made to accurately convey at the pertinent aspects of the patient visit.    There may have been are other unrelated non-urgent complaints, but due to the busy schedule and the amount of time already spent with her, time does not permit to address these issues at today's visit. Another appointment may have or has been requested to review these additional issues.     Harle Libra, MD, MS

## 2023-06-24 ENCOUNTER — Telehealth: Payer: Self-pay

## 2023-06-24 LAB — COMPREHENSIVE METABOLIC PANEL WITH GFR
ALT: 29 U/L (ref 0–35)
AST: 25 U/L (ref 0–37)
Albumin: 4.5 g/dL (ref 3.5–5.2)
Alkaline Phosphatase: 83 U/L (ref 39–117)
BUN: 12 mg/dL (ref 6–23)
CO2: 27 meq/L (ref 19–32)
Calcium: 9.5 mg/dL (ref 8.4–10.5)
Chloride: 102 meq/L (ref 96–112)
Creatinine, Ser: 0.43 mg/dL (ref 0.40–1.20)
GFR: 111.29 mL/min (ref >60.00)
Glucose, Bld: 144 mg/dL — ABNORMAL HIGH (ref 70–99)
Potassium: 3.3 meq/L — ABNORMAL LOW (ref 3.5–5.1)
Sodium: 139 meq/L (ref 135–145)
Total Bilirubin: 0.4 mg/dL (ref 0.2–1.2)
Total Protein: 7.5 g/dL (ref 6.0–8.3)

## 2023-06-24 LAB — MICROALBUMIN / CREATININE URINE RATIO
Creatinine,U: 123.1 mg/dL
Microalb Creat Ratio: 30.6 mg/g — ABNORMAL HIGH (ref 0.0–30.0)
Microalb, Ur: 3.8 mg/dL — ABNORMAL HIGH (ref 0.0–1.9)

## 2023-06-24 LAB — CBC WITH DIFFERENTIAL/PLATELET
Basophils Absolute: 0 10*3/uL (ref 0.0–0.1)
Basophils Relative: 0.8 % (ref 0.0–3.0)
Eosinophils Absolute: 0.1 10*3/uL (ref 0.0–0.7)
Eosinophils Relative: 1.2 % (ref 0.0–5.0)
HCT: 40.5 % (ref 36.0–46.0)
Hemoglobin: 13.2 g/dL (ref 12.0–15.0)
Lymphocytes Relative: 34.9 % (ref 12.0–46.0)
Lymphs Abs: 1.9 10*3/uL (ref 0.7–4.0)
MCHC: 32.6 g/dL (ref 30.0–36.0)
MCV: 78.5 fl (ref 78.0–100.0)
Monocytes Absolute: 0.3 10*3/uL (ref 0.1–1.0)
Monocytes Relative: 5.6 % (ref 3.0–12.0)
Neutro Abs: 3.1 10*3/uL (ref 1.4–7.7)
Neutrophils Relative %: 57.5 % (ref 43.0–77.0)
Platelets: 260 10*3/uL (ref 150.0–400.0)
RBC: 5.15 Mil/uL — ABNORMAL HIGH (ref 3.87–5.11)
RDW: 14.7 % (ref 11.5–15.5)
WBC: 5.4 10*3/uL (ref 4.0–10.5)

## 2023-06-24 LAB — LIPID PANEL
Cholesterol: 165 mg/dL (ref 0–200)
HDL: 36.4 mg/dL — ABNORMAL LOW (ref >39.00)
LDL Cholesterol: 89 mg/dL (ref 0–99)
NonHDL: 128.92
Total CHOL/HDL Ratio: 5
Triglycerides: 199 mg/dL — ABNORMAL HIGH (ref 0.0–149.0)
VLDL: 39.8 mg/dL (ref 0.0–40.0)

## 2023-06-24 NOTE — Progress Notes (Signed)
 Complex Care Management Note  Care Guide Note 06/24/2023 Name: TAISHMARA SADUSKY MRN: [id number] DOB: Sep 06, 1970  PAMALEE LUSKEY is a 53 y.o. year old female who sees Catheryn Cluck, MD for primary care. I reached out to [employer] by phone today to offer complex care management services.  Ms. Bir was given information about Complex Care Management services today including:   The Complex Care Management services include support from the care team which includes your Nurse Care Manager, Clinical Social Worker, or Pharmacist.  The Complex Care Management team is here to help remove barriers to the health concerns and goals most important to you. Complex Care Management services are voluntary, and the patient may decline or stop services at any time by request to their care team member.   Complex Care Management Consent Status: Patient agreed to services and verbal consent obtained.   Follow up plan:  Telephone appointment with complex care management team member scheduled for:  07/13/23 at 3:30 p.m.   Encounter Outcome:  Patient Scheduled  [facility]  [facility], [facility] Care Management Assistant Direct Dial: [phone]  Fax: [phone]

## 2023-06-25 ENCOUNTER — Ambulatory Visit
Admission: RE | Admit: 2023-06-25 | Discharge: 2023-06-25 | Disposition: A | Discharge status: Home / Self Care | Source: Ambulatory Visit | Attending: Family Medicine

## 2023-06-25 ENCOUNTER — Ambulatory Visit
Admission: EM | Admit: 2023-06-25 | Discharge: 2023-06-25 | Disposition: A | Discharge status: Home / Self Care | Attending: Family Medicine | Admitting: Family Medicine

## 2023-06-25 ENCOUNTER — Ambulatory Visit: Payer: Self-pay

## 2023-06-25 DIAGNOSIS — L03114 Cellulitis of left upper limb: Secondary | ICD-10-CM

## 2023-06-25 DIAGNOSIS — Z1231 Encounter for screening mammogram for malignant neoplasm of breast: Secondary | ICD-10-CM | POA: Diagnosis not present

## 2023-06-25 MED ORDER — DOXYCYCLINE HYCLATE 100 MG PO CAPS: 100.0000 mg | ORAL_CAPSULE | Freq: Two times a day (BID) | ORAL | 0 refills | Status: DC

## 2023-06-25 NOTE — Telephone Encounter (Signed)
 Chief Complaint: arm pain Symptoms: arm pain, redness, heat, H/A, nausea Frequency: Thursday AM Pertinent Negatives: Patient denies CP, SOB, vomiting Disposition: [] ED /[x] Urgent Care (no appt availability in office) / [] Appointment(In office/virtual)/ []  [facility] [facility]/ [] Home Care/ [] Refused Recommended Disposition /[] [place] [facility]/ []  Follow-up with PCP Additional Notes: Pt reports redness, pain, and heat to her R shoulder. Pt was seen 4/30 in the office and had many vaccines across both shoulders. Pt states she has some pain in her L shoulder but her R shoulder is worse with redness and heat. Pt also endorses fever. States her fever this afternoon was 101.56F. Pt also endorses chills, headache, and nausea. RN advised pt she should be seen by a HCP within 4 hrs. Pt agreeable and will go to the UC closest to her home. RN advised pt if she becomes weak, dizzy, or experiences CP or SOB she needs to go to the ED. Pt verbalized understanding.    Copied from CRM [id number]. Topic: Clinical - Red Word Triage >> Jun 25, 2023  4:37 PM Dana Rojas wrote: Red Word that prompted transfer to Nurse Triage: allergic reaction to the shots that were provided the day she went to get physical on 04/30 Reason for Disposition  [1] Fever > 100.0 F (37.8 C) AND [2] diabetes mellitus or weak immune system (e.g., HIV positive, cancer chemo, splenectomy, organ transplant, chronic steroids)  Answer Assessment - Initial Assessment Questions 1. TEMPERATURE: "What is the most recent temperature?"  "How was it measured?"      101.56F at 1400 today, took ibuprofen 2. ONSET: "When did the fever start?"      Thursday AM after 4/30 appt  3. CHILLS: "Do you have chills?" If yes: "How bad are they?"  (e.g., none, mild, moderate, severe)   - NONE: no chills   - MILD: feeling cold   - MODERATE: feeling very cold, some shivering (feels better under a thick blanket)   - SEVERE: feeling extremely cold with shaking  chills (general body shaking, rigors; even under a thick blanket)      Endorses chills 4. OTHER SYMPTOMS: "Do you have any other symptoms besides the fever?"  (e.g., abdomen pain, cough, diarrhea, earache, headache, sore throat, urination pain)     H/A, nausea  5. CAUSE: If there are no symptoms, ask: "What do you think is causing the fever?"      Vaccines  6. CONTACTS: "Does anyone else in the family have an infection?"     No  7. TREATMENT: "What have you done so far to treat this fever?" (e.g., medications)     Ibuprofen  8. IMMUNOCOMPROMISE: "Do you have of the following: diabetes, HIV positive, splenectomy, cancer chemotherapy, chronic steroid treatment, transplant patient, etc."     Type 2 dM  Redness and rash to L shoulder. Feels hot to the touch. Denies itching. Endorses pain. H/A, nausea,  Protocols used: Fever-A-AH

## 2023-06-25 NOTE — ED Triage Notes (Signed)
 Pt present with allergic reaction.   Pt states she received shots on Wednesday and at night she developed a rash and fever. Pt states her skin is hot to the touch

## 2023-06-25 NOTE — Discharge Instructions (Signed)
 Start Doxy twice daily for 10 days.  The redness on your arm has been marked with a skin marker.  While this may extend slightly during the first 24 hours if it extends dramatically and/or you continue to have fevers please go to the emergency room for further evaluation.  Follow-up with your PCP in 2 to 3 days for recheck.  Hope you feel better soon!

## 2023-06-25 NOTE — ED Provider Notes (Signed)
 [facility]    CSN: [id number] Arrival date & time: 06/25/23  1701      History   Chief Complaint Chief Complaint  Patient presents with   Allergic Reaction   Fever   Rash   Nausea    HPI Dana Rojas is a 53 y.o. female presents for possible infection.  Patient was seen in her PCP office 2 days ago where she received 3 vaccines, Tdap and shingles to the right upper arm and the pneumonia shot to the left upper arm.  She states she had some soreness to the injection sites without any redness or warmth but states this morning she woke up with redness and warmth of her left arm as well as a fever of 101.  States she feels achy.  Denies any other potential causes of fever including sore throat, cough/congestion, nausea vomiting or diarrhea.  She has been taking ibuprofen.  Denies history of MRSA.  No other concerns at this time.   Allergic Reaction Fever Rash Associated symptoms: fever     Past Medical History:  Diagnosis Date   Allergy    Atrial fibrillation with tachycardic ventricular rate (HCC) 12/08/2019   Seasonal allergies    Vitamin D  deficiency     Patient Active Problem List   Diagnosis Date Noted   Recurrent herpes labialis 06/23/2023   Chronic urticaria 06/23/2023   Seasonal allergic rhinitis 06/23/2023   Upper abdominal pain 12/18/2022   Vertigo 08/05/2022   Type 2 diabetes mellitus with hyperglycemia, without long-term current use of insulin  (HCC) 08/05/2022   Hymenoptera allergy 08/05/2022   Gastroesophageal reflux disease 08/18/2021   Sensorineural hearing loss (SNHL), bilateral 08/18/2021   Tinnitus of both ears 08/18/2021   Family history of coronary artery disease 02/12/2020   Hypokalemia 12/09/2019   Gastroenteritis 12/09/2019   Hyperglycemia 12/09/2019   Atrial fibrillation with RVR (HCC) 12/09/2019   Troponin level elevated 12/09/2019   Flash pulmonary edema (HCC)    New onset a-fib (HCC) 12/08/2019   Bradycardia, sinus  12/08/2019   Non-intractable vomiting 12/08/2019   Sciatica of left side 08/05/2016   Hyperthyroidism 12/20/2014   Intramural leiomyoma of uterus 02/02/2014   Menorrhagia with regular cycle 02/02/2014   Hyperlipidemia 08/12/2009   OBESITY 08/12/2009   GESTATIONAL DIABETES 08/12/2009   SCOLIOSIS 08/12/2009   HEADACHE 08/12/2009   CHICKENPOX, HX OF 08/12/2009    Past Surgical History:  Procedure Laterality Date   HERNIA REPAIR     HERNIA REPAIR     X2   LEFT HEART CATH AND CORONARY ANGIOGRAPHY N/A 12/11/2019   Procedure: LEFT HEART CATH AND CORONARY ANGIOGRAPHY;  Surgeon: Avanell Leigh, MD;  Location: [facility];  Service: Cardiovascular;  Laterality: N/A;   TUBAL LIGATION      OB History     Gravida  2   Para  2   Term      Preterm      AB      Living  2      SAB      IAB      Ectopic      Multiple      Live Births               Home Medications    Prior to Admission medications   Medication Sig Start Date End Date Taking? Authorizing Provider  doxycycline (VIBRAMYCIN) 100 MG capsule Take 1 capsule (100 mg total) by mouth 2 (two) times daily. 06/25/23  Yes Saya Mccoll, Jodi R, NP  albuterol  (VENTOLIN  HFA) 108 (90 Base) MCG/ACT inhaler Inhale 1-2 puffs into the lungs every 6 (six) hours as needed. 04/02/23   Alleen Arbour, NP  B-D UF III MINI PEN NEEDLES 31G X 5 MM MISC 1 EACH BY DOES NOT APPLY ROUTE 3 (THREE) TIMES DAILY WITH MEALS. 09/14/22   McElwee, Lauren A, NP  Blood Glucose Monitoring Suppl DEVI 1 each by Does not apply route in the morning, at noon, and at bedtime. May substitute to any manufacturer covered by patient's insurance. 08/03/22   Catheryn Cluck, MD  Continuous Glucose Sensor (FREESTYLE LIBRE 3 PLUS SENSOR) MISC Change sensor every 15 days. 06/23/23   Catheryn Cluck, MD  EPINEPHrine  0.3 mg/0.3 mL IJ SOAJ injection Reason minister if you have any respiratory distress following an insect sting and call 911 08/05/22   Catheryn Cluck, MD  fexofenadine  (ALLEGRA ) 180 MG tablet Take 1 tablet (180 mg total) by mouth every morning. 06/23/23 06/17/24  Catheryn Cluck, MD  insulin  glargine (LANTUS  SOLOSTAR) 100 UNIT/ML Solostar Pen Inject 20 Units into the skin daily. 06/23/23 06/17/24  Catheryn Cluck, MD  levocetirizine (XYZAL ) 5 MG tablet Take 1 tablet (5 mg total) by mouth every evening. 06/23/23 06/17/24  Catheryn Cluck, MD  metFORMIN  (GLUCOPHAGE -XR) 500 MG 24 hr tablet Take 2 tablets (1,000 mg total) by mouth 2 (two) times daily with a meal. 06/23/23 06/17/24  Catheryn Cluck, MD  omeprazole  (PRILOSEC) 40 MG capsule Take 1 capsule (40 mg total) by mouth daily. 06/23/23 06/17/24  Catheryn Cluck, MD  ONETOUCH ULTRA test strip 1 EACH BY IN VITRO ROUTE IN THE [place], AT NOON, AND AT BEDTIME 09/03/22   Catheryn Cluck, MD  rosuvastatin  (CRESTOR ) 20 MG tablet Take 1 tablet (20 mg total) by mouth daily. 06/23/23 06/17/24  Catheryn Cluck, MD  scopolamine  (TRANSDERM-SCOP) 1 MG/3DAYS Place 1 patch (1.5 mg total) onto the skin every 3 (three) days. 06/23/23   Catheryn Cluck, MD  tirzepatide [facility]) 2.5 MG/0.5ML Pen Inject 2.5 mg into the skin once a week. 06/23/23   Catheryn Cluck, MD  valACYclovir  (VALTREX ) 500 MG tablet TAKE 1 TABLET TWICE DAILY AT FIRST SIGN OF OUTBREAK FOR 3 TO 5 DAYS. 06/23/23   Catheryn Cluck, MD    Family History Family History  Problem Relation Age of Onset   Heart disease Mother    Heart disease Father    Diabetes Paternal Grandfather    Thyroid  disease Neg Hx     Social History Social History   Tobacco Use   Smoking status: Never    Passive exposure: Never   Smokeless tobacco: Never  Vaping Use   Vaping status: Never Used  Substance Use Topics   Alcohol use: No   Drug use: No     Allergies   Bee venom and Fluogen [influenza virus vaccine]   Review of Systems Review of Systems  Constitutional:  Positive for fever.  Skin:        Skin infection at injection site      Physical Exam Triage Vital Signs ED Triage Vitals [06/25/23 1713]  Encounter Vitals Group     BP 138/82     Systolic BP Percentile      Diastolic BP Percentile      Pulse Rate 94     Resp 16     Temp 99.1 F (37.3 C)     Temp Source Oral  SpO2 94 %     Weight      Height      Head Circumference      Peak Flow      Pain Score 6     Pain Loc      Pain Education      Exclude from Growth Chart    No data found.  Updated Vital Signs BP 138/82 (BP Location: Left Arm)   Pulse 94   Temp 99.1 F (37.3 C) (Oral)   Resp 16   LMP 12/24/2020   SpO2 94%   Visual Acuity Right Eye Distance:   Left Eye Distance:   Bilateral Distance:    Right Eye Near:   Left Eye Near:    Bilateral Near:     Physical Exam Vitals and nursing note reviewed.  Constitutional:      General: She is not in acute distress.    Appearance: Normal appearance. She is not ill-appearing.  HENT:     Head: Normocephalic and atraumatic.  Eyes:     Pupils: Pupils are equal, round, and reactive to light.  Cardiovascular:     Rate and Rhythm: Normal rate.  Pulmonary:     Effort: Pulmonary effort is normal.  Skin:    General: Skin is warm and dry.          Comments: There is a 5cm area of erythema and warmth to the mid left lateral upper arm.  No induration or fluctuance.  Tender to palpation.  See photo.  Neurological:     General: No focal deficit present.     Mental Status: She is alert and oriented to person, place, and time.  Psychiatric:        Mood and Affect: Mood normal.        Behavior: Behavior normal.      UC Treatments / Results  Labs (all labs ordered are listed, but only abnormal results are displayed) Labs Reviewed - No data to display  Comp Met (CMET) Order: [id number]  Status: Final result     Next appt: 07/13/2023 at 03:30 PM in [facility], [facility])     Dx: Type 2 diabetes mellitus with hypergl...   Test Result Released: Yes (not seen)   0  Result Notes     1 HM Topic          Component Ref Range & Units (hover) 2 d ago (06/23/23) 6 mo ago (12/18/22) 10 mo ago (08/03/22) 3 yr ago (12/13/19) 3 yr ago (12/10/19) 3 yr ago (12/10/19) 3 yr ago (12/09/19)  Sodium 139 141 137 138 R 140 R 140 R 141 R  Potassium 3.3 Low  4.0 3.9 4.2 R, CM 3.9 R 3.3 Low  R 3.7 R  Chloride 102 101 100 100 R 104 R 105 R 105 R  CO2 27 26 26 25  R 26 R 26 R 24 R  Glucose, Bld 144 High  172 High  200 High  195 High  CM 163 High  CM 118 High  CM 153 High  CM  BUN 12 12 10 13  R 6 R 6 R 8 R  Creatinine, Ser 0.43 0.52 0.45 0.65 R 0.50 R 0.62 R 0.57 R  Total Bilirubin 0.4 0.5 0.6 0.4 R     Alkaline Phosphatase 83 89 89 95 R     AST 25 27 24  117 High  R     ALT 29 34 30 148 High  R  Total Protein 7.5 7.5 7.0 7.7 R     Albumin 4.5 4.5 4.4 4.2 R     GFR 111.29 106.70 CM 110.77 CM      Comment: Calculated using the CKD-EPI Creatinine Equation (2021)  Calcium  9.5 9.9 9.1 9.3 R 9.3 R 8.8 Low  R 8.2 Low  R  Resulting Agency [place] [facility] [place] [facility] [place] [facility]        Specimen Collected: 06/23/23 16:30 Last Resulted: 06/24/23 11:06       EKG   Radiology No results found.  Procedures Procedures (including critical care time)  Medications Ordered in UC Medications - No data to display  Initial Impression / Assessment and Plan / UC Course  I have reviewed the triage vital signs and the nursing notes.  Pertinent labs & imaging results that were available during my care of the patient were reviewed by me and considered in my medical decision making (see chart for details).     Reviewed exam and symptoms with patient.  She is well-appearing and in no acute distress.  Discussed local reaction but given reported onset of fever as well as new onset erythema and warmth we will start doxycycline.  Advised to continue Tylenol  or ibuprofen as needed.  Border of area was marked with a skin marker  and patient was instructed if redness extends beyond the marked area and/or symptoms worsen, red flags reviewed, she is to go to the emergency room ASAP and she verbalized understanding.  PCP follow-up 2 to 3 days for recheck. Final Clinical Impressions(s) / UC Diagnoses   Final diagnoses:  Cellulitis of left upper arm     Discharge Instructions      Start Doxy twice daily for 10 days.  The redness on your arm has been marked with a skin marker.  While this may extend slightly during the first 24 hours if it extends dramatically and/or you continue to have fevers please go to the emergency room for further evaluation.  Follow-up with your PCP in 2 to 3 days for recheck.  Hope you feel better soon!    ED Prescriptions     Medication Sig Dispense Auth. Provider   doxycycline (VIBRAMYCIN) 100 MG capsule Take 1 capsule (100 mg total) by mouth 2 (two) times daily. 20 capsule Leota Maka, Jodi R, NP      PDMP not reviewed this encounter.   Alleen Arbour, NP 06/25/23 1729

## 2023-06-27 LAB — HEPATITIS C ANTIBODY: Hepatitis C Ab: NONREACTIVE

## 2023-06-27 LAB — URINALYSIS W MICROSCOPIC + REFLEX CULTURE
Bilirubin Urine: NEGATIVE
Glucose, UA: NEGATIVE
Hgb urine dipstick: NEGATIVE
Hyaline Cast: NONE SEEN /LPF
Nitrites, Initial: NEGATIVE
Protein, ur: NEGATIVE
RBC / HPF: NONE SEEN /HPF (ref 0–2)
Specific Gravity, Urine: 1.02 (ref 1.001–1.035)
pH: <=5 (ref 5.0–8.0)

## 2023-06-27 LAB — CULTURE INDICATED

## 2023-06-27 LAB — THYROID PANEL WITH TSH
Free Thyroxine Index: 2.7 (ref 1.4–3.8)
T3 Uptake: 31 % (ref 22–35)
T4, Total: 8.6 ug/dL (ref 5.1–11.9)
TSH: 0.25 m[IU]/L — ABNORMAL LOW

## 2023-06-27 LAB — URINE CULTURE
MICRO NUMBER:: 16399539
Result:: NO GROWTH
SPECIMEN QUALITY:: ADEQUATE

## 2023-06-27 LAB — VITAMIN D 1,25 DIHYDROXY
Vitamin D 1, 25 (OH)2 Total: 41 pg/mL (ref 18–72)
Vitamin D2 1, 25 (OH)2: <8 pg/mL
Vitamin D3 1, 25 (OH)2: 41 pg/mL

## 2023-06-28 ENCOUNTER — Ambulatory Visit: Admitting: Nurse Practitioner

## 2023-06-28 ENCOUNTER — Encounter: Payer: Self-pay | Admitting: Nurse Practitioner

## 2023-06-28 ENCOUNTER — Ambulatory Visit: Payer: Self-pay

## 2023-06-28 VITALS — BP 122/78 | HR 88 | Temp 98.2°F | Ht 62.0 in | Wt 171.8 lb

## 2023-06-28 DIAGNOSIS — R590 Localized enlarged lymph nodes: Secondary | ICD-10-CM | POA: Diagnosis not present

## 2023-06-28 DIAGNOSIS — J3081 Allergic rhinitis due to animal (cat) (dog) hair and dander: Secondary | ICD-10-CM | POA: Insufficient documentation

## 2023-06-28 DIAGNOSIS — L03114 Cellulitis of left upper limb: Secondary | ICD-10-CM | POA: Diagnosis not present

## 2023-06-28 MED ORDER — CEFTRIAXONE SODIUM 1 G IJ SOLR
1.0000 g | Freq: Once | INTRAMUSCULAR | Status: AC
  Administered 2023-06-28: 1 g via INTRAMUSCULAR

## 2023-06-28 MED ORDER — CLINDAMYCIN HCL 300 MG PO CAPS: 300.0000 mg | ORAL_CAPSULE | Freq: Three times a day (TID) | ORAL | 0 refills | Status: DC

## 2023-06-28 NOTE — Progress Notes (Signed)
 Established Patient Visit  Patient: Dana Rojas   DOB: [birth year]   53 y.o. Female  MRN: [id number] Visit Date: 06/28/2023  Subjective:    Chief Complaint  Patient presents with   Cellulitis     Left arm and center of neck ore painful   HPI Cellulitis of left upper extremity Onset after vaccine administration on 06/23/2023.  She was evaluated by urgent care provider on 06/25/23, doxycycline prescribed and started on same day. Today she reports worsening redness, swelling and swollen lymph node. She denies any fever or chills. No paresthesia or weakness or itching  Stop doxycycline Start clindamycin 300mg  TID x 7days Ok to use tylenol  or ibuprofen as needed for pain. Elevated arm as much as possible Advised to Go to ED if symptoms worsen or you develop any new symptoms. F/up with pcp in 5-7days  Reviewed medical, surgical, and social history today  Medications: Outpatient Medications Prior to Visit  Medication Sig   albuterol  (VENTOLIN  HFA) 108 (90 Base) MCG/ACT inhaler Inhale 1-2 puffs into the lungs every 6 (six) hours as needed.   B-D UF III MINI PEN NEEDLES 31G X 5 MM MISC 1 EACH BY DOES NOT APPLY ROUTE 3 (THREE) TIMES DAILY WITH MEALS.   Blood Glucose Monitoring Suppl DEVI 1 each by Does not apply route in the morning, at noon, and at bedtime. May substitute to any manufacturer covered by patient's insurance.   Continuous Glucose Sensor (FREESTYLE LIBRE 3 PLUS SENSOR) MISC Change sensor every 15 days.   EPINEPHrine  0.3 mg/0.3 mL IJ SOAJ injection Reason minister if you have any respiratory distress following an insect sting and call 911   fexofenadine  (ALLEGRA ) 180 MG tablet Take 1 tablet (180 mg total) by mouth every morning.   insulin  glargine (LANTUS  SOLOSTAR) 100 UNIT/ML Solostar Pen Inject 20 Units into the skin daily.   levocetirizine (XYZAL ) 5 MG tablet Take 1 tablet (5 mg total) by mouth every evening.   metFORMIN  (GLUCOPHAGE -XR) 500 MG 24 hr  tablet Take 2 tablets (1,000 mg total) by mouth 2 (two) times daily with a meal.   omeprazole  (PRILOSEC) 40 MG capsule Take 1 capsule (40 mg total) by mouth daily.   ONETOUCH ULTRA test strip 1 EACH BY IN VITRO ROUTE IN THE MORNING, AT NOON, AND AT BEDTIME   rosuvastatin  (CRESTOR ) 20 MG tablet Take 1 tablet (20 mg total) by mouth daily.   scopolamine  (TRANSDERM-SCOP) 1 MG/3DAYS Place 1 patch (1.5 mg total) onto the skin every 3 (three) days.   tirzepatide (MOUNJARO) 2.5 MG/0.5ML Pen Inject 2.5 mg into the skin once a week.   valACYclovir  (VALTREX ) 500 MG tablet TAKE 1 TABLET TWICE DAILY AT FIRST SIGN OF OUTBREAK FOR 3 TO 5 DAYS.   [DISCONTINUED] doxycycline (VIBRAMYCIN) 100 MG capsule Take 1 capsule (100 mg total) by mouth 2 (two) times daily.   No facility-administered medications prior to visit.   Reviewed past medical and social history.   ROS per HPI above      Objective:  BP 122/78 (BP Location: Right Arm, Patient Position: Sitting, Cuff Size: Normal)   Pulse 88   Temp 98.2 F (36.8 C) (Temporal)   Ht 5\' 2"  (1.575 m)   Wt 171 lb 12.8 oz (77.9 kg)   LMP 12/24/2020   SpO2 98%   BMI 31.42 kg/m      Physical Exam Vitals and nursing note reviewed.  Constitutional:  General: She is not in acute distress. Cardiovascular:     Rate and Rhythm: Normal rate.     Pulses: Normal pulses.          Carotid pulses are 2+ on the left side.      Radial pulses are 2+ on the left side.  Pulmonary:     Effort: Pulmonary effort is normal.  Musculoskeletal:     Left shoulder: Normal.     Left upper arm: Swelling and tenderness present.     Left elbow: Normal.     Left forearm: Normal.  Lymphadenopathy:     Upper Body:     Left upper body: Supraclavicular adenopathy and axillary adenopathy present.  Skin:    Findings: Erythema present.       Neurological:     Mental Status: She is alert and oriented to person, place, and time.     No results found for any visits on  06/28/23.    Assessment & Plan:    Problem List Items Addressed This Visit     Cellulitis of left upper extremity - Primary   Onset after vaccine administration on 06/23/2023.  She was evaluated by urgent care provider on 06/25/23, doxycycline prescribed and started on same day. Today she reports worsening redness, swelling and swollen lymph node. She denies any fever or chills. No paresthesia or weakness or itching  Stop doxycycline Start clindamycin 300mg  TID x 7days Ok to use tylenol  or ibuprofen as needed for pain. Elevated arm as much as possible Advised to Go to ED if symptoms worsen or you develop any new symptoms. F/up with pcp in 5-7days      Relevant Medications   clindamycin (CLEOCIN) 300 MG capsule   Other Visit Diagnoses       Lymphadenopathy, axillary       Relevant Medications   cefTRIAXone (ROCEPHIN) injection 1 g (Completed)   clindamycin (CLEOCIN) 300 MG capsule     Supraclavicular adenopathy       Relevant Medications   cefTRIAXone (ROCEPHIN) injection 1 g (Completed)   clindamycin (CLEOCIN) 300 MG capsule      Return in about 1 week (around 07/05/2023) for cellulitis with Dr. Hildy Lowers.     Kathrene Parents, NP

## 2023-06-28 NOTE — Assessment & Plan Note (Addendum)
 Onset after vaccine administration on 06/23/2023.  She was evaluated by urgent care provider on 06/25/23, doxycycline prescribed and started on same day. Today she reports worsening redness, swelling and swollen lymph node. She denies any fever or chills. No paresthesia or weakness or itching  Stop doxycycline Start clindamycin 300mg  TID x 7days Ok to use tylenol  or ibuprofen as needed for pain. Elevated arm as much as possible Advised to Go to ED if symptoms worsen or you develop any new symptoms. F/up with pcp in 5-7days

## 2023-06-28 NOTE — Telephone Encounter (Signed)
 Noted. Patient advised to go to urgent care by Nurse triage.

## 2023-06-28 NOTE — Telephone Encounter (Signed)
  Chief Complaint: worsening cellulitis Symptoms: spreading redness,throbbing pain to left upper arm, fever Frequency: started antibiotic on 06/25/23, worsening Pertinent Negatives: Patient denies drainage, pus, weakness in left arm Disposition: [] ED /[] Urgent Care (no appt availability in office) / [x] Appointment(In office/virtual)/ []  [place] [facility]/ [] Home Care/ [] Refused Recommended Disposition /[] [place] [facility]/ []  Follow-up with PCP Additional Notes: Patient states she has been icing the arm and taking ibuprofen and tylenol . No available appts with PCP today, patient agreeable to see another provider at [facility]. Patient requested latest appt.  Copied from CRM [id number]. Topic: Clinical - Red Word Triage >> Jun 28, 2023 10:41 AM Eleanore Grey wrote: Red Word that prompted transfer to Nurse Triage: Patient says she got shots in left arm and they are infected, says arm is red and swollen. Was triaged last week and advised to go to urgent care and she did go there and was given an antibiotic. Says they drew a circle on her arm and told her she needed to be seen if redness spread outside the circle and it has. Reason for Disposition  [1] Taking antibiotic > 24 hours AND [2] fever persists  Answer Assessment - Initial Assessment Questions 1. SYMPTOM: "What's the main symptom you're concerned about?" (e.g., redness, swelling, pain, fever, weakness)     Spreading redness and throbbing pain.  2. CELLULITIS LOCATION: "Where is the cellulitis located?" (e.g., hand, arm, foot, leg, face)     Left upper arm/bicep.  3. CELLULITIS SIZE: "What is the size of the red area?" (e.g., inches, centimeters; compare to size of a coin) .     Length is about size of pointer finger, width 3 pointer fingers. Up to shoulder and down to elbow.  4. BETTER-SAME-WORSE: "Are you getting better, staying the same, or getting worse compared to the day you started the antibiotics?"      Worse.  5.  PAIN: Do you have any pain?"  If Yes, ask: "How bad is the pain?"  (e.g., Scale 1-10; mild, moderate, or severe)    - MILD (1-3): Doesn't interfere with normal activities.     - MODERATE (4-7): Interferes with normal activities or awakens from sleep.    - SEVERE (8-10): Excruciating pain, unable to do any normal activities.       7/10.  6. FEVER: "Do you have a fever?" If Yes, ask: "What is it, how was it measured and when did it start?"     101.3-101.6 yesterday. This  morning 99.1.  7. OTHER SYMPTOMS: "Do you have any other symptoms?" (e.g., pus coming from a wound, red streaks, weakness)     Denies.  8. DIAGNOSIS DATE: "When was the cellulitis diagnosed?" "By whom?"      06/25/23.  9. ANTIBIOTIC NAME: "What antibiotic(s) are you taking?"  "How many times per day?" (Be sure the patient is receiving the antibiotic as directed).      Doxycycline twice daily.  10. ANTIBIOTIC DATE: "When was the antibiotic started?"       06/25/23.  11. FOLLOW-UP APPOINTMENT: "Do you have follow-up appointment with your doctor?"       No/  Protocols used: Cellulitis on Antibiotic Follow-up Call-A-AH

## 2023-06-28 NOTE — Telephone Encounter (Signed)
 Noted. Pt scheduled to see Kathrene Parents at 1:00 pm on  06/28/2023

## 2023-06-28 NOTE — Patient Instructions (Addendum)
 Stop doxycycline Start clindamycin Ok to use tylenol  or ibuprofen as needed for pain. Go to ED if symptoms worsen or you develop any new symptoms.  Cellulitis, Adult  Cellulitis is a skin infection. The infected area is usually warm, red, swollen, and tender. It most commonly occurs on the lower body, such as the legs, feet, and toes, but this condition can occur on any part of the body. The infection can travel to the muscles, blood, and underlying tissue and become life-threatening without treatment. It is important to get medical treatment right away for this condition. What are the causes? Cellulitis is caused by bacteria. The bacteria enter through a break in the skin, such as a cut, burn, insect or animal bite, open sore, or crack. What increases the risk? This condition is more likely to occur in people who: Have a weak body's defense system (immune system). Are older than 53 years old. Have diabetes. Have a type of long-term (chronic) liver disease (cirrhosis) or kidney disease. Are obese. Have a skin condition such as: An itchy rash, such as eczema or psoriasis. A fungal rash on the feet or in skinfolds. Blistering rashes, such as shingles or chickenpox. Slow movement of blood in the veins (venous stasis). Fluid buildup below the skin (edema). Have open wounds on the skin, such as cuts, puncture wounds, burns, bites, scrapes, tattoos, piercings, or wounds from surgery. Have had radiation therapy. Use IV drugs. What are the signs or symptoms? Symptoms of this condition include: Skin that looks red, purple, or slightly darker than your usual skin color. Streaks or spots on the skin. Swollen area of the skin. Tenderness or pain when an area of the skin is touched. Warm skin. Fever or chills. Blisters. Tiredness (fatigue). How is this diagnosed? This condition is diagnosed based on a medical history and physical exam. You may also have tests, including: Blood  tests. Imaging tests. Tests on a sample of fluid taken from the wound (wound culture). How is this treated? Treatment for this condition may include: Medicines. These may include antibiotics or medicines to treat allergies (antihistamines). Rest. Applying cold or warm wet cloths (compresses) to the skin. If the condition is severe, you may need to stay in the hospital and get antibiotics through an IV. The infection usually starts to get better within 1-2 days of treatment. Follow these instructions at home: Medicines Take over-the-counter and prescription medicines only as told by your health care provider. If you were prescribed antibiotics, take them as told by your provider. Do not stop using the antibiotic even if you start to feel better. General instructions Drink enough fluid to keep your pee (urine) pale yellow. Do not touch or rub the infected area. Raise (elevate) the infected area above the level of your heart while you are sitting or lying down. Return to your normal activities as told by your provider. Ask your provider what activities are safe for you. Apply warm or cold compresses to the affected area as told by your provider. Keep all follow-up visits. Your provider will need to make sure that a more serious infection is not developing. Contact a health care provider if: You have a fever. Your symptoms do not improve within 1-2 days of starting treatment or you develop new symptoms. Your bone or joint underneath the infected area becomes painful after the skin has healed. Your infection returns in the same area or another area. Signs of this may include: You notice a swollen bump in the infected  area. Your red area gets larger, turns dark in color, or becomes more painful. Drainage increases. Pus or a bad smell develops in your infected area. You have more pain. You feel ill and have muscle aches and weakness. You develop vomiting or diarrhea that will not go  away. Get help right away if: You notice red streaks coming from the infected area. You notice the skin turns purple or black and falls off. This symptom may be an emergency. Get help right away. Call 911. Do not wait to see if the symptom will go away. Do not drive yourself to the hospital. This information is not intended to replace advice given to you by your health care provider. Make sure you discuss any questions you have with your health care provider. Document Revised: 10/07/2021 Document Reviewed: 10/07/2021 Elsevier Patient Education  2024 [employer].

## 2023-06-29 ENCOUNTER — Ambulatory Visit: Payer: Self-pay

## 2023-06-29 ENCOUNTER — Encounter: Payer: Self-pay | Admitting: Family Medicine

## 2023-06-29 ENCOUNTER — Other Ambulatory Visit: Payer: Self-pay

## 2023-06-29 ENCOUNTER — Emergency Department (HOSPITAL_BASED_OUTPATIENT_CLINIC_OR_DEPARTMENT_OTHER)
Admission: EM | Admit: 2023-06-29 | Discharge: 2023-06-29 | Disposition: A | Discharge status: Home / Self Care | Attending: Emergency Medicine | Admitting: Emergency Medicine

## 2023-06-29 DIAGNOSIS — L539 Erythematous condition, unspecified: Secondary | ICD-10-CM | POA: Diagnosis not present

## 2023-06-29 DIAGNOSIS — T7840XA Allergy, unspecified, initial encounter: Secondary | ICD-10-CM | POA: Diagnosis not present

## 2023-06-29 DIAGNOSIS — T881XXA Other complications following immunization, not elsewhere classified, initial encounter: Secondary | ICD-10-CM | POA: Diagnosis present

## 2023-06-29 NOTE — Telephone Encounter (Signed)
 Copied from CRM [id number]. Topic: Clinical - Red Word Triage >> Jun 29, 2023  8:03 AM Dana Rojas wrote: Dana Rojas that prompted transfer to Nurse Triage: Allergic reaction. Patient stated her reaction is now out outside of the line on her left arm.   Chief Complaint: Information Only   Disposition: [x] ED /[] Urgent Care (no appt availability in office) / [] Appointment(In office/virtual)/ []  [place] [facility]/ [] Home Care/ [] Refused Recommended Disposition /[] [place] [facility]/ [x]  Follow-up with PCP  Additional Notes: KP is being triaged for a treated allergic reaction this morning.. Review reveals instructions by the provider to seek emergency care if the affected area extended the line drawn on the arm. The patient confirms the rash has extended past the line drawn on the arm. Informed the patient to follow the instructions of the UC provider. Patient verbalized understanding and agreed to disposition.     Reason for Disposition . Caller has already spoken with another triager or PCP AND has further questions AND triager able to answer questions.  Protocols used: No Contact or Duplicate Contact Call-A-AH

## 2023-06-29 NOTE — Discharge Instructions (Signed)
 Suspect that this is a local reaction to the immunizations.  And not a cellulitis.  But to be very careful would continue the clindamycin.  It is also okay to take Zyrtec or Benadryl .  I do not feel that this is an allergic reaction.  Work note provided.  Return for any new or worse symptoms.

## 2023-06-29 NOTE — ED Triage Notes (Signed)
 Pt states that on Wednesday of last week she received three immunizations, TDaP and PNA shots on her L arm and Shingles shot in her R. Pt states that since then she's been having redness and swelling that is spreading on her L arm. Pt also reports fever in the days immediately following the vaccinations. Denies SOB, throat swelling.

## 2023-06-29 NOTE — Telephone Encounter (Signed)
 Noted pt is scheduled for 1 week follow up with Dr. Hildy Lowers MD for cellulitis

## 2023-06-29 NOTE — ED Provider Notes (Signed)
 [facility] Provider Note   CSN: [id number] Arrival date & time: 06/29/23  4098     History  Chief Complaint  Patient presents with   Allergic Reaction    Dana Rojas is a 53 y.o. female.  Patient had vaccines Wednesday a week ago had the shingles vaccine to the right upper extremity and the left upper extremity got the Pneumovax and the tetanus shot.  Next morning patient had significant redness right around the injection site probably measuring about 4 to 5 cm.  Patient went to urgent care on Friday and they started her on doxycycline.  Patient has been taking that since then.  Redness started to spread patient went to primary care doctor yesterday they told her to stop the doxycycline start clindamycin.  Patient did start the clindamycin yesterday they also gave her an injection of some type of antibiotic.  Early on patient had fever and chills but that has resolved.  Patient states that she has had unusual reactions to vaccines in the past.  Past medical history significant for seasonal allergies atrial fibrillation with rapid ventricular rate in 2021.  Past surgical history significant for left heart catheterization in 2021 and tubal ligation.  Patient is never used tobacco products       Home Medications Prior to Admission medications   Medication Sig Start Date End Date Taking? Authorizing Provider  albuterol  (VENTOLIN  HFA) 108 (90 Base) MCG/ACT inhaler Inhale 1-2 puffs into the lungs every 6 (six) hours as needed. 04/02/23   Alleen Arbour, NP  B-D UF III MINI PEN NEEDLES 31G X 5 MM MISC 1 EACH BY DOES NOT APPLY ROUTE 3 (THREE) TIMES DAILY WITH MEALS. 09/14/22   McElwee, Lauren A, NP  Blood Glucose Monitoring Suppl DEVI 1 each by Does not apply route in the morning, at noon, and at bedtime. May substitute to any manufacturer covered by patient's insurance. 08/03/22   Catheryn Cluck, MD  clindamycin (CLEOCIN) 300 MG capsule Take 1  capsule (300 mg total) by mouth 3 (three) times daily. 06/28/23   Nche, Connye Delaine, NP  Continuous Glucose Sensor (FREESTYLE LIBRE 3 PLUS SENSOR) MISC Change sensor every 15 days. 06/23/23   Catheryn Cluck, MD  EPINEPHrine  0.3 mg/0.3 mL IJ SOAJ injection Reason minister if you have any respiratory distress following an insect sting and call 911 08/05/22   Catheryn Cluck, MD  fexofenadine  (ALLEGRA ) 180 MG tablet Take 1 tablet (180 mg total) by mouth every morning. 06/23/23 06/17/24  Catheryn Cluck, MD  insulin  glargine (LANTUS  SOLOSTAR) 100 UNIT/ML Solostar Pen Inject 20 Units into the skin daily. 06/23/23 06/17/24  Catheryn Cluck, MD  levocetirizine (XYZAL ) 5 MG tablet Take 1 tablet (5 mg total) by mouth every evening. 06/23/23 06/17/24  Catheryn Cluck, MD  metFORMIN  (GLUCOPHAGE -XR) 500 MG 24 hr tablet Take 2 tablets (1,000 mg total) by mouth 2 (two) times daily with a meal. 06/23/23 06/17/24  Catheryn Cluck, MD  omeprazole  (PRILOSEC) 40 MG capsule Take 1 capsule (40 mg total) by mouth daily. 06/23/23 06/17/24  Catheryn Cluck, MD  ONETOUCH ULTRA test strip 1 EACH BY IN VITRO ROUTE IN THE [place], AT NOON, AND AT BEDTIME 09/03/22   Catheryn Cluck, MD  rosuvastatin  (CRESTOR ) 20 MG tablet Take 1 tablet (20 mg total) by mouth daily. 06/23/23 06/17/24  Catheryn Cluck, MD  scopolamine  (TRANSDERM-SCOP) 1 MG/3DAYS Place 1 patch (1.5 mg total) onto the  skin every 3 (three) days. 06/23/23   Catheryn Cluck, MD  tirzepatide [facility]) 2.5 MG/0.5ML Pen Inject 2.5 mg into the skin once a week. 06/23/23   Catheryn Cluck, MD  valACYclovir  (VALTREX ) 500 MG tablet TAKE 1 TABLET TWICE DAILY AT FIRST SIGN OF OUTBREAK FOR 3 TO 5 DAYS. 06/23/23   Catheryn Cluck, MD      Allergies    Bee venom and Fluogen [influenza virus vaccine]    Review of Systems   Review of Systems  Constitutional:  Negative for chills and fever.  HENT:  Negative for ear pain and sore throat.   Eyes:  Negative for pain and  visual disturbance.  Respiratory:  Negative for cough and shortness of breath.   Cardiovascular:  Negative for chest pain and palpitations.  Gastrointestinal:  Negative for abdominal pain and vomiting.  Genitourinary:  Negative for dysuria and hematuria.  Musculoskeletal:  Negative for arthralgias and back pain.  Skin:  Negative for color change and rash.  Neurological:  Negative for seizures and syncope.  All other systems reviewed and are negative.   Physical Exam Updated Vital Signs BP (!) 146/96 (BP Location: Right Arm)   Pulse 81   Temp 97.6 F (36.4 C) (Oral)   Resp 16   LMP 12/24/2020   SpO2 97%  Physical Exam Vitals and nursing note reviewed.  Constitutional:      General: She is not in acute distress.    Appearance: Normal appearance. She is well-developed. She is not ill-appearing or toxic-appearing.  HENT:     Head: Normocephalic and atraumatic.     Mouth/Throat:     Mouth: Mucous membranes are moist.  Eyes:     Conjunctiva/sclera: Conjunctivae normal.  Cardiovascular:     Rate and Rhythm: Normal rate and regular rhythm.     Heart sounds: No murmur heard. Pulmonary:     Effort: Pulmonary effort is normal. No respiratory distress.     Breath sounds: Normal breath sounds.  Abdominal:     Palpations: Abdomen is soft.     Tenderness: There is no abdominal tenderness.  Musculoskeletal:        General: Swelling present.     Cervical back: Neck supple.     Comments: Left upper extremity with an area of erythema as delineated by the marks.  Redness is spreading more distally at this point in time.  No significant tenderness there is some increased warmth.  Left elbow with some redness but no joint swelling.  Good range of motion at the wrist shoulder and elbow.  Radial pulses 2+ sensation is intact.  Right upper extremity without evidence of any inflammatory changes.  Lymphadenopathy:     Cervical: No cervical adenopathy.  Skin:    General: Skin is warm and dry.      Capillary Refill: Capillary refill takes less than 2 seconds.  Neurological:     General: No focal deficit present.     Mental Status: She is alert and oriented to person, place, and time.  Psychiatric:        Mood and Affect: Mood normal.     ED Results / Procedures / Treatments   Labs (all labs ordered are listed, but only abnormal results are displayed) Labs Reviewed - No data to display  EKG None  Radiology No results found.  Procedures Procedures    Medications Ordered in ED Medications - No data to display  ED Course/ Medical Decision Making/ A&P  Medical Decision Making  Patient with immunizations Wednesday a week ago.  Next morning patient had redness developing to the left arm.  Patient had Shingrix  in the right arm and had the Pneumovax and tetanus into the left arm.  Injection site seems to be a little bit low.  Erythema started the next morning.  Patient went to urgent care on Friday they started on doxycycline.  She was taking that up until yesterday when she saw her primary care doctor.  They told her to stop the doxycycline and they switched her to clindamycin.  Gave her a shot of some type of antibiotic.  This does not appear to be an allergic reaction does not appear to distinctly be a cellulitis.  I think this is a local reaction from the vaccine.  But since she started clindamycin would continue with that.  Also could take Benadryl  or Zyrtec.  Patient nontoxic no acute distress.  Vital signs here are very reassuring temp 97.6 heart rate 81 respirations 16 blood pressure 146/96 oxygen saturation is 97%.  Patient stable for discharge home and close follow-up with primary care doctor he will return for any new or worse symptoms Final Clinical Impression(s) / ED Diagnoses Final diagnoses:  Local reaction to immunization, initial encounter    Rx / DC Orders ED Discharge Orders     None         Nicklas Barns,  MD 06/29/23 [phone]

## 2023-07-01 ENCOUNTER — Other Ambulatory Visit: Payer: Self-pay | Admitting: Family Medicine

## 2023-07-06 ENCOUNTER — Ambulatory Visit: Admitting: Family Medicine

## 2023-07-13 ENCOUNTER — Other Ambulatory Visit: Payer: Self-pay

## 2023-07-13 DIAGNOSIS — E1165 Type 2 diabetes mellitus with hyperglycemia: Secondary | ICD-10-CM

## 2023-07-13 MED ORDER — TIRZEPATIDE 5 MG/0.5ML ~~LOC~~ SOAJ: 5.0000 mg | SUBCUTANEOUS | 0 refills | Status: DC

## 2023-07-13 NOTE — Progress Notes (Signed)
 07/13/2023 Name: Dana Rojas MRN: [id number] DOB: [birth year]  Chief Complaint  Patient presents with   Diabetes Management Plan   Medication Mangement    MYKELTI GOLDENSTEIN is a 53 y.o. year old female who presented for a telephone visit.   They were referred to the pharmacist by their PCP for assistance in managing diabetes and hyperlipidemia.   Subjective:  Care Team: Primary Care Provider: Catheryn Cluck, MD ; Next Scheduled Visit: N/A Endocrinology referral placed by PCP on 4/30 but patient has not received a call yet  Medication Access/Adherence  Current Pharmacy:  [id number] [place] [place] [address] [place] [address] Phone: [phone] Fax: [phone]   Patient reports affordability concerns with their medications: No  Patient reports access/transportation concerns to their pharmacy: No  Patient reports adherence concerns with their medications:  No    Diabetes:  Current medications: Lantus  20 units daily at bedtime, metformin  1000 mg (2 tablets) twice daily with food, Mounjaro 2.5 weekly on Fridays Denies nausea, vomiting, diarrhea, constipation, and abdominal pain with Florence Hunt States Florence Hunt is affordable with husband's insurance (~$45/month) Will complete fourth dose of Mounjaro 2.5 mg on 5/23  Medications tried in the past: N/A  Current glucose readings: yesterday BGs were 143 before breakfast, 175 before lunch, and 153 before dinner Lowest BG around 80s-90s - only sees a couple days a week if she skips a meal Highest BG 200 - happened last Monday in the AM when she had forgotten to take her meds the night before and also ate [employer] late for Mother's Day  Using glucometer; testing 2-3x daily prior to each meal PCP sent in script for Freestyle, however, patient not interested in using at this time due to copay. States she gets fingerstick testing supplies for free. Could be interested in CGM  pending the copay but would want to trial it first as she recently experienced an allergic reaction after vaccine administration and is hesitant to try any new needles/injections.  Patient denies hypoglycemic s/sx including dizziness, shakiness, sweating. Patient reports hyperglycemic symptoms including polyuria. Notes polyuria has improved this week with improving blood sugars.  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; is a teacher  Hyperlipidemia: Current medications: rosuvastatin  20 mg daily  Objective:  Lab Results  Component Value Date   HGBA1C 13.1 (A) 06/23/2023    Lab Results  Component Value Date   CREATININE 0.43 06/23/2023   BUN 12 06/23/2023   NA 139 06/23/2023   K 3.3 (L) 06/23/2023   CL 102 06/23/2023   CO2 27 06/23/2023    Lab Results  Component Value Date   CHOL 165 06/23/2023   HDL 36.40 (L) 06/23/2023   LDLCALC 89 06/23/2023   LDLDIRECT 130.0 08/03/2022   TRIG 199.0 (H) 06/23/2023   CHOLHDL 5 06/23/2023    Medications Reviewed Today     Reviewed by Marybelle Smiling, RPH (Pharmacist) on 07/13/23 at 1551  Med List Status: <None>   Medication Order Taking? Sig Documenting Provider Last Dose Status Informant  albuterol  (VENTOLIN  HFA) 108 (90 Base) MCG/ACT inhaler [id number]  Inhale 1-2 puffs into the lungs every 6 (six) hours as needed. Alleen Arbour, NP  Active   B-D UF III MINI PEN NEEDLES 31G X 5 MM MISC [id number]  1 EACH BY DOES NOT APPLY ROUTE 3 (THREE) TIMES DAILY WITH MEALS. McElwee,  Lauren A, NP  Active   Blood Glucose Monitoring Suppl DEVI [id number]  1 each by Does not apply route in the morning, at noon, and at bedtime. May substitute to any manufacturer covered by patient's insurance. Catheryn Cluck, MD  Active   clindamycin  (CLEOCIN ) 300 MG capsule [id number]  Take 1 capsule (300 mg total) by mouth 3 (three) times daily. Nche, Connye Delaine, NP  Active    Continuous Glucose Sensor (FREESTYLE LIBRE 3 PLUS SENSOR) MISC [id number]  Change sensor every 15 days. Catheryn Cluck, MD  Active   EPINEPHrine  0.3 mg/0.3 mL IJ SOAJ injection [id number]  Reason minister if you have any respiratory distress following an insect sting and call 911 Catheryn Cluck, MD  Active   fexofenadine  (ALLEGRA ) 180 MG tablet [id number]  Take 1 tablet (180 mg total) by mouth every morning. Catheryn Cluck, MD  Active   insulin  glargine (LANTUS  SOLOSTAR) 100 UNIT/ML Solostar Pen [id number] Yes Inject 20 Units into the skin daily. Catheryn Cluck, MD Taking Active            Med Note Vicenta Graft, Steward Sames H   Tue Jul 13, 2023  3:51 PM) At bedtime  levocetirizine (XYZAL ) 5 MG tablet [id number]  Take 1 tablet (5 mg total) by mouth every evening. Catheryn Cluck, MD  Active   metFORMIN  (GLUCOPHAGE -XR) 500 MG 24 hr tablet [id number] Yes Take 2 tablets (1,000 mg total) by mouth 2 (two) times daily with a meal. Catheryn Cluck, MD Taking Active   omeprazole  (PRILOSEC) 40 MG capsule [id number]  Take 1 capsule (40 mg total) by mouth daily. Catheryn Cluck, MD  Active   [facility] ULTRA test strip [id number]  1 EACH BY IN VITRO ROUTE IN THE MORNING, AT NOON, AND AT BEDTIME Catheryn Cluck, MD  Active   rosuvastatin  (CRESTOR ) 20 MG tablet [id number]  Take 1 tablet (20 mg total) by mouth daily. Catheryn Cluck, MD  Active   scopolamine  (TRANSDERM-SCOP) 1 MG/3DAYS [id number]  Place 1 patch (1.5 mg total) onto the skin every 3 (three) days. Catheryn Cluck, MD  Active   tirzepatide [facility]) 2.5 MG/0.5ML Pen [id number] Yes Inject 2.5 mg into the skin once a week. Catheryn Cluck, MD Taking Active            Med Note Vicenta Graft, Terressa Evola H   Tue Jul 13, 2023  3:32 PM) Fridays  valACYclovir  (VALTREX ) 500 MG tablet [id number]  TAKE 1 TABLET TWICE DAILY AT FIRST SIGN OF OUTBREAK FOR 3 TO 5 DAYS. Catheryn Cluck, MD  Active             Assessment/Plan:   Diabetes: - Currently  uncontrolled, A1c 13.1% in April 2025 - Reviewed long term cardiovascular and renal outcomes of uncontrolled blood sugar - Reviewed goal A1c, goal fasting, and goal 2 hour post prandial glucose - Recommend to continue Lantus  and metformin ; discussed moving Lantus  administration to in the morning to provide better coverage throughout the day. Patient is agreeable. - Recommend to increase Mounjaro to 5 mg starting on 5/30 after completing 4 weeks of 2.5 mg; continue to assess need to decrease Lantus  as Mounjaro dose is titrated up - [employer] copay $30/month; confirmed patient is still not interested at this time but can revisit at future visit - Recommend to continue checking glucose before each meal - Schedule PCP appointment at next visit for A1c check (due in July)  Hyperlipidemia: - Currently uncontrolled, LDL 89 in April  2025 - Continue current regimen - Not discussed at the current visit. Will continue to follow and address at follow-up visit  Follow Up Plan: 5 weeks (patient will be on cruise in 4 weeks)  Abelina Abide, PharmD PGY1 Pharmacy Resident 07/13/2023 4:27 PM

## 2023-08-16 ENCOUNTER — Other Ambulatory Visit: Payer: Self-pay

## 2023-08-16 DIAGNOSIS — E1165 Type 2 diabetes mellitus with hyperglycemia: Secondary | ICD-10-CM

## 2023-08-16 MED ORDER — TIRZEPATIDE 5 MG/0.5ML ~~LOC~~ SOAJ: 5.0000 mg | SUBCUTANEOUS | 0 refills | Status: DC

## 2023-08-16 NOTE — Progress Notes (Signed)
   08/16/2023  Patient ID: Dana Rojas, female   DOB: 23-Dec-1970, 53 y.o.   MRN: [id number]  Subjective/Objective Telephone visit to follow-up on management of diabetes  Diabetes: Current medications: Lantus  20 units daily at bedtime, metformin  1000 mg twice daily with food, Mounjaro  5mg  weekly on Fridays Completed 4 weeks at Mounjaro  5mg ; tolerating mostly well but has experience sulfur burps with this dose (did not with 2.5mg ) Current glucose readings: FBG 79-118 Does not endorse any s/sx of hypoglycemia or hyperglycemia Last A1c 13.1% in April   Hyperlipidemia: Current medications: rosuvastatin  20 mg daily   Assessment/Plan  Diabetes: -Currently uncontrolled based on recent A1c, but FBG reflects greatly improved control -Continue current regimen at this time -Hopefully in another 4 weeks we can increase Mounjaro  to 7.5mg  and decrease Lantus  by 10% -Recommended dietary changes and increased water intake to help with sulfur burps.  Use as needed peto bismol (has provided some relief). -Patient will be due for f/u A1c in July and currently does not have f/u appt scheduled with Dr. Sebastian   Hyperlipidemia: -Currently uncontrolled, LDL 89 in April 2025 -Continue current regimen -Not discussed at the current visit. Will continue to follow and address at follow-up visit   Follow Up Plan: 4 weeks  Dana Rojas, PharmD, DPLA

## 2023-08-31 ENCOUNTER — Ambulatory Visit (INDEPENDENT_AMBULATORY_CARE_PROVIDER_SITE_OTHER)

## 2023-08-31 DIAGNOSIS — Z111 Encounter for screening for respiratory tuberculosis: Secondary | ICD-10-CM

## 2023-08-31 NOTE — Progress Notes (Signed)
 PPD Placement note  Dana Rojas, 53 y.o. female is here today for placement of PPD test Reason for PPD test: Request for Work Pt taken PPD test before: yes Verified in allergy area and with patient that they are not allergic to the products PPD is made of (Phenol or Tween). Yes Is patient taking any oral or IV steroid medication now or have they taken it in the last month? no Has the patient ever received the BCG vaccine?: no Has the patient been in recent contact with anyone known or suspected of having active TB disease?: no            PPD placed on 08/31/2023 on left forearm by Laymon Gladis Sharps, CMA.  Patient advised to return for reading within 48-72 hours.

## 2023-09-02 ENCOUNTER — Ambulatory Visit

## 2023-09-02 LAB — TB SKIN TEST
Induration: 0 mm
TB Skin Test: NEGATIVE

## 2023-09-02 NOTE — Progress Notes (Signed)
 PPD Reading Note PPD read and results entered in EpicCare. Result: 0 mm induration. Interpretation:  Patient came back today to have PPD test read and test was read by Laymon Gladis Sharps, CMA. PPD was 0 mm otherwise negative. Form was completed for patient and given back to her.  Allergic reaction: no

## 2023-09-13 ENCOUNTER — Other Ambulatory Visit: Payer: Self-pay

## 2023-09-13 DIAGNOSIS — E1165 Type 2 diabetes mellitus with hyperglycemia: Secondary | ICD-10-CM

## 2023-09-13 NOTE — Progress Notes (Unsigned)
   09/13/2023  Patient ID: Dana Rojas, female   DOB: 01/11/1971, 53 y.o.   MRN: [id number]  Subjective/Objective Telephone visit to follow-up on management of diabetes   Diabetes: Current medications: Lantus  20 units daily at bedtime, metformin  1000 mg twice daily with food, Mounjaro  5mg  weekly on Fridays Completed 8 weeks at Mounjaro  5mg ; tolerating well and sulfur burps resolved by 5th week at this dose Current glucose readings: FBG 62-124 Does not endorse any s/sx of hypoglycemia or hyperglycemia Last A1c 13.1% in April   Hyperlipidemia: Current medications: rosuvastatin  20 mg daily   Assessment/Plan   Diabetes: -Currently uncontrolled based on recent A1c, but FBG reflects greatly improved control -Continue current regimen at this time -I recommend increasing Mounjaro  to 7.5mg  weekly at this time- order pending for Dr. Sebastian to sign if in agreement. Use as needed peto bismol (has provided some relief). -Decrease Lantus  to 12 units daily; can increase to 14 units 3 days after 1st Mounjaro  7.5mg  injection if FBG >130 -Patient will be due for f/u A1c and August appointment with Dr. Sebastian was canceled (provider). Instructed patient to call office or schedule follow-up via MyChart   Hyperlipidemia: -Currently uncontrolled, LDL 89 in April 2025 -Continue current regimen -Recommend follow-up lipid panel at next visit; if LDL>70, increase rosuvastatin  to 40mg  daily   Follow Up Plan: 4 weeks   Dana Rojas, PharmD, DPLA

## 2023-09-14 MED ORDER — TIRZEPATIDE 7.5 MG/0.5ML ~~LOC~~ SOAJ: 7.5000 mg | SUBCUTANEOUS | 1 refills | Status: DC

## 2023-10-08 ENCOUNTER — Telehealth: Payer: Self-pay

## 2023-10-08 NOTE — Telephone Encounter (Signed)
 Copied from CRM [id number]. Topic: Appointments - Appointment Cancel/Reschedule >> Oct 08, 2023 11:22 AM Deaijah H wrote: Patient would like to know if Dana Rojas has anything available today instead of 8/18. Please have her call [phone] to confirm

## 2023-10-08 NOTE — Progress Notes (Signed)
   10/08/2023  Patient ID: Dana Rojas, female   DOB: [birth year], 53 y.o.   MRN: [id number]  Received an in basket message from Dr. Oswald office stating patient is needing to reschedule our telephone visit on Monday to another day/time.  I attempted to contact the patient but was not able to reach her to reschedule.  HIPAA compliant voicemail was left with  my direct phone number.  [name], PharmD, DPLA

## 2023-10-11 ENCOUNTER — Other Ambulatory Visit: Payer: Self-pay

## 2023-10-11 NOTE — Progress Notes (Unsigned)
   10/11/2023  Patient ID: Dana Rojas, female   DOB: Jun 27, 1970, 53 y.o.   MRN: [id number]  Subjective/Objective Telephone visit to follow-up on management of diabetes   Diabetes: Current medications: Lantus  12 units daily at bedtime, metformin  1000 mg twice daily with food, Mounjaro  7.5mg  weekly on Fridays Completed 3 weeks of Mounjaro  7.5mg  weekly last Friday; endorses tolerating well after the 1st dose but has had nausea and diarrhea since the 2nd and 3rd doses.   Does endorse any s/sx of hypoglycemia this past Saturday with BG in the 50's throughout the day; likely related to decreased oral intake due to nausea and decreased appetite Outside of Saturday, FBG has been 70-90 Patient states 1 Mounjaro  7.5mg  pen malfunctioned and manufacturer is supposed to replace; she will be due for next injection Friday.  Last A1c 13.1% in April   Hyperlipidemia: Current medications: rosuvastatin  20 mg daily   Assessment/Plan   Diabetes: -Currently uncontrolled based on recent A1c, but FBG reflects greatly improved control -Continue Mounjaro  7.5mg  at least another month; nausea and diarrhea should subside with time -Prescription pending for PRN ondansetron  to help with nausea; advised patient to take OTC loperamide if needed for diarrhea -Decrease Lantus  to 8 untits at bedtime to decrease risk of hypoglycemia -Continue metformin  1000mg  BID -Patient will be due for f/u A1c and August appointment with Dr. Sebastian was canceled (provider). Instructed patient to call office or schedule follow-up via MyChart   Hyperlipidemia: -Currently uncontrolled, LDL 89 in April 2025 -Continue current regimen -Recommend follow-up lipid panel at next visit; if LDL>70, increase rosuvastatin  to 40mg  daily   Follow Up Plan: 2-3 weeks   Dana Rojas, PharmD, DPLA

## 2023-10-12 MED ORDER — ONDANSETRON 4 MG PO TBDP: 4.0000 mg | ORAL_TABLET | Freq: Three times a day (TID) | ORAL | 0 refills | Status: AC | PRN

## 2023-10-21 ENCOUNTER — Encounter: Admitting: Family Medicine

## 2023-10-28 ENCOUNTER — Other Ambulatory Visit: Payer: Self-pay

## 2023-10-28 DIAGNOSIS — E1165 Type 2 diabetes mellitus with hyperglycemia: Secondary | ICD-10-CM

## 2023-10-28 NOTE — Progress Notes (Unsigned)
   10/28/2023  Patient ID: Dana Rojas, female   DOB: [birth year], 53 y.o.   MRN: [id number]  Subjective/Objective Telephone visit to follow-up on management of diabetes   Diabetes: Current medications: Lantus  12 units daily at bedtime, metformin  1000 mg twice daily with food, Mounjaro  7.5mg  weekly on Fridays Patient has completed approximately 6 weeks at Mounjaro  7.5mg  weekly, and endorses tolerating medication well.  GI upset has subsided, and she has only had to use PRN ondansetron  once since we last spoke 3 weeks ago.  Does endorse 3 instances of BG dropping into the 60's but states she knows she had waited too long to eat, which led to this She does monitor both FBG and post-prandial BG and states values average 80-110 Last A1c 13.1% in April   Hyperlipidemia: -Current medications: rosuvastatin  20 mg daily -LDL of 89 in April   Assessment/Plan   Diabetes: -Currently uncontrolled based on recent A1c, but FBG reflects greatly improved control -Continue Mounjaro  7.5mg  until patient has a follow-up visit with Dr. Sebastian (only has 2 pens left, so pending refill for 1 month supply)- patient has work schedule now and will call office to schedule f/u with Dr. Sebastian -Continue Lantus  12 units daily and metformin  1000mg  BID at this time -Due for A1c; based on results, I recommend increasing Mounjaro  to 10mg  weekly and stopping Lantus   Hyperlipidemia: -Currently uncontrolled based on LDL >70 -Continue current regimen -Recommend follow-up lipid panel at next visit; if LDL>70, consider increasing rosuvastatin  to 40mg  daily   Follow Up Plan: 2 months   [name], PharmD, DPLA

## 2023-10-29 MED ORDER — TIRZEPATIDE 7.5 MG/0.5ML ~~LOC~~ SOAJ: 7.5000 mg | SUBCUTANEOUS | 0 refills | Status: DC

## 2023-11-25 ENCOUNTER — Other Ambulatory Visit

## 2023-11-25 ENCOUNTER — Telehealth: Payer: Self-pay

## 2023-11-25 NOTE — Telephone Encounter (Signed)
 Forwarding message below. Pt is due for a follow up appointment for Cellulitis if needed. Please schedule follow up. I currently don't see any note regarding patient to return for labs; we can discuss at OV.

## 2023-11-25 NOTE — Telephone Encounter (Signed)
 Copied from CRM #[id number]. Topic: Clinical - Request for Lab/Test Order >> Nov 24, 2023  2:35 PM Suzen RAMAN wrote: Reason for CRM: Patient was instructed by provider to return to the office for repeat labs. No current/future orders located in the system. Please place orders and contact patient to schedule. Prefer day/time 01/04/24 at 8:20.   CB#[phone]

## 2023-11-29 DIAGNOSIS — M7541 Impingement syndrome of right shoulder: Secondary | ICD-10-CM | POA: Diagnosis not present

## 2023-12-07 ENCOUNTER — Telehealth: Payer: Self-pay

## 2023-12-07 DIAGNOSIS — E785 Hyperlipidemia, unspecified: Secondary | ICD-10-CM

## 2023-12-07 DIAGNOSIS — E1165 Type 2 diabetes mellitus with hyperglycemia: Secondary | ICD-10-CM

## 2023-12-07 NOTE — Progress Notes (Unsigned)
   12/07/2023  Patient ID: Dana Rojas, female   DOB: 1971-02-07, 53 y.o.   MRN: [id number]  Returning missed call/voicemail from patient in regard to Mounjaro .  Patient is out of medication and due for next injection this weekend.  Patient has a follow-up visit with PCP again 11/7, so I am pending an order for a 1 month supply to prevent missing a dose of Mounjaro  7.5mg  prior to next visit with patient.  I have a telephone visit scheduled with the patient again 11/5.  Patient requesting orders for lab work to be placed prior to upcoming PCP visit, so these results can be discussed at her 11/7 visit.  I recommend a follow-up CMP, A1c, Lipid panel, thyroid  panel, and CBC.  Consulting PCP to see if he is in agreement with placing lab orders prior to 11/7 visit.  [name], PharmD, DPLA

## 2023-12-07 NOTE — Progress Notes (Signed)
   12/07/2023  Patient ID: Dana Rojas, female   DOB: 09-14-1970, 53 y.o.   MRN: [id number]  Attempted to return missed call/voicemail from patient yesterday.  I was not able to reach the patient but did leave a HIPAA compliant voicemail with my direct phone number.  [name], PharmD, DPLA

## 2023-12-08 ENCOUNTER — Telehealth: Payer: Self-pay

## 2023-12-08 MED ORDER — TIRZEPATIDE 7.5 MG/0.5ML ~~LOC~~ SOAJ: 7.5000 mg | SUBCUTANEOUS | 0 refills | Status: DC

## 2023-12-08 NOTE — Progress Notes (Signed)
   12/08/2023  Patient ID: Dana Rojas, female   DOB: 12-03-1970, 52 y.o.   MRN: [id number]  Lab orders placed by Dr. Sebastian and another month refill of Mounjaro  7.5mg  sent to pharmacy.  Tried to contact patient to inform, but I had to leave a voicemail.  Detailed message left based on DPR allowances; also left my direct number, so patient can return my call if needed.  Dana Rojas, PharmD, DPLA

## 2023-12-08 NOTE — Addendum Note (Signed)
 Addended by: SEBASTIAN RIGHTER B on: 12/08/2023 08:22 AM   Modules accepted: Orders

## 2023-12-08 NOTE — Telephone Encounter (Signed)
 Labs ordered. Rx signed.

## 2023-12-09 DIAGNOSIS — M25511 Pain in right shoulder: Secondary | ICD-10-CM | POA: Diagnosis not present

## 2023-12-13 ENCOUNTER — Other Ambulatory Visit (INDEPENDENT_AMBULATORY_CARE_PROVIDER_SITE_OTHER)

## 2023-12-13 DIAGNOSIS — E785 Hyperlipidemia, unspecified: Secondary | ICD-10-CM

## 2023-12-13 DIAGNOSIS — E1165 Type 2 diabetes mellitus with hyperglycemia: Secondary | ICD-10-CM

## 2023-12-13 LAB — LIPID PANEL
Cholesterol: 122 mg/dL (ref 0–200)
HDL: 37 mg/dL — ABNORMAL LOW (ref >39.00)
LDL Cholesterol: 61 mg/dL (ref 0–99)
NonHDL: 85.46
Total CHOL/HDL Ratio: 3
Triglycerides: 122 mg/dL (ref 0.0–149.0)
VLDL: 24.4 mg/dL (ref 0.0–40.0)

## 2023-12-13 LAB — CBC WITH DIFFERENTIAL/PLATELET
Basophils Absolute: 0 K/uL (ref 0.0–0.1)
Basophils Relative: 0.2 % (ref 0.0–3.0)
Eosinophils Absolute: 0.2 K/uL (ref 0.0–0.7)
Eosinophils Relative: 2.4 % (ref 0.0–5.0)
HCT: 41.2 % (ref 36.0–46.0)
Hemoglobin: 13.3 g/dL (ref 12.0–15.0)
Lymphocytes Relative: 21.7 % (ref 12.0–46.0)
Lymphs Abs: 1.9 K/uL (ref 0.7–4.0)
MCHC: 32.3 g/dL (ref 30.0–36.0)
MCV: 78.1 fl (ref 78.0–100.0)
Monocytes Absolute: 0.5 K/uL (ref 0.1–1.0)
Monocytes Relative: 5.4 % (ref 3.0–12.0)
Neutro Abs: 6 K/uL (ref 1.4–7.7)
Neutrophils Relative %: 70.3 % (ref 43.0–77.0)
Platelets: 308 K/uL (ref 150.0–400.0)
RBC: 5.28 Mil/uL — ABNORMAL HIGH (ref 3.87–5.11)
RDW: 15.2 % (ref 11.5–15.5)
WBC: 8.6 K/uL (ref 4.0–10.5)

## 2023-12-13 LAB — COMPREHENSIVE METABOLIC PANEL WITH GFR
ALT: 27 U/L (ref 0–35)
AST: 23 U/L (ref 0–37)
Albumin: 4.8 g/dL (ref 3.5–5.2)
Alkaline Phosphatase: 69 U/L (ref 39–117)
BUN: 23 mg/dL (ref 6–23)
CO2: 27 meq/L (ref 19–32)
Calcium: 9.7 mg/dL (ref 8.4–10.5)
Chloride: 104 meq/L (ref 96–112)
Creatinine, Ser: 0.62 mg/dL (ref 0.40–1.20)
GFR: 101.56 mL/min (ref >60.00)
Glucose, Bld: 73 mg/dL (ref 70–99)
Potassium: 4.2 meq/L (ref 3.5–5.1)
Sodium: 142 meq/L (ref 135–145)
Total Bilirubin: 0.5 mg/dL (ref 0.2–1.2)
Total Protein: 8.1 g/dL (ref 6.0–8.3)

## 2023-12-13 LAB — HEMOGLOBIN A1C: Hgb A1c MFr Bld: 6.3 % (ref 4.6–6.5)

## 2023-12-16 ENCOUNTER — Ambulatory Visit: Payer: Self-pay | Admitting: Family Medicine

## 2023-12-16 LAB — URINALYSIS W MICROSCOPIC + REFLEX CULTURE
Bilirubin Urine: NEGATIVE
Glucose, UA: NEGATIVE
Hgb urine dipstick: NEGATIVE
Nitrites, Initial: NEGATIVE
Specific Gravity, Urine: 1.04 — ABNORMAL HIGH (ref 1.001–1.035)
WBC, UA: NONE SEEN /HPF (ref 0–5)
pH: 5.5 (ref 5.0–8.0)

## 2023-12-16 LAB — URINE CULTURE
MICRO NUMBER:: 17126279
SPECIMEN QUALITY:: ADEQUATE

## 2023-12-16 LAB — CULTURE INDICATED

## 2023-12-16 LAB — THYROID PANEL WITH TSH
Free Thyroxine Index: 3.1 (ref 1.4–3.8)
T3 Uptake: 29 % (ref 22–35)
T4, Total: 10.6 ug/dL (ref 5.1–11.9)
TSH: 0.64 m[IU]/L

## 2023-12-28 NOTE — Progress Notes (Unsigned)
   12/29/2023  Patient ID: Dana Rojas, female   DOB: 28-Nov-1970, 53 y.o.   MRN: [id number]  Subjective/Objective Telephone visit to follow-up on management of diabetes   Diabetes: Current medications: Lantus  12 units daily at bedtime, metformin  1000 mg twice daily with food, Mounjaro  7.5mg  weekly on Fridays Patient has completed approximately 3 months of Mounjaro  7.5mg  weekly and endorses tolerating medication well.  GI upset has subsided, and she has not needed PRN ondansetron  for nausea recently Patient has experienced weight loss since starting Mounjaro  and states she would like to lose approximately 20 more lbs She does monitor both FBG and states the past two mornings this has been in the upper 60's A1c down to 6.3% from 13.1% ACEi/ARB for cardiorenal protection:  no; last documented OV BP elevated at 146/96 on 5/6 UACR 30.6 on 5/30 Statin for ASCVD risk reduction: rosuvastatin  20mg  daily   Hyperlipidemia: -Current medications: rosuvastatin  20 mg daily -LDL 61, down from 89; TG 122, down from 199   Assessment/Plan   Diabetes: -A1c and LDL at goal -BP not at goal based on last recorded OV BP; hopeful that this has improved with weight loss; if not, consider addition of low dose ACEi/ARB for cardiorenal protection and BP control -Last UACR slightly above goal of <30; I recommend follow-up lab when patient has next lab work done- low dose ACEi/ARB may also help with this.  Would not initiate SGLT2 at this time based on recent UA results (ketones in urine and possible UTI) -I recommend increasing Mounjaro  to 10mg  weekly and stopping Lantus   -Patient would like to decrease needed medications; consider stopping metformin  in the future if diabetes remains well controlled on Mounjaro   Hyperlipidemia: -Controlled -Continue current regimen   Follow Up Plan:  1 month   Dana Rojas, PharmD, DPLA

## 2023-12-29 ENCOUNTER — Other Ambulatory Visit: Payer: Self-pay

## 2023-12-29 DIAGNOSIS — E782 Mixed hyperlipidemia: Secondary | ICD-10-CM

## 2023-12-29 DIAGNOSIS — E1165 Type 2 diabetes mellitus with hyperglycemia: Secondary | ICD-10-CM

## 2023-12-31 ENCOUNTER — Ambulatory Visit: Admitting: Family Medicine

## 2023-12-31 ENCOUNTER — Encounter: Payer: Self-pay | Admitting: Family Medicine

## 2023-12-31 VITALS — BP 137/88 | HR 70 | Temp 97.0°F | Resp 18 | Wt 150.0 lb

## 2023-12-31 DIAGNOSIS — H1031 Unspecified acute conjunctivitis, right eye: Secondary | ICD-10-CM

## 2023-12-31 DIAGNOSIS — E6609 Other obesity due to excess calories: Secondary | ICD-10-CM

## 2023-12-31 DIAGNOSIS — E1165 Type 2 diabetes mellitus with hyperglycemia: Secondary | ICD-10-CM | POA: Diagnosis not present

## 2023-12-31 DIAGNOSIS — E66811 Obesity, class 1: Secondary | ICD-10-CM

## 2023-12-31 DIAGNOSIS — I10 Essential (primary) hypertension: Secondary | ICD-10-CM | POA: Diagnosis not present

## 2023-12-31 DIAGNOSIS — Z7985 Long-term (current) use of injectable non-insulin antidiabetic drugs: Secondary | ICD-10-CM

## 2023-12-31 DIAGNOSIS — B001 Herpesviral vesicular dermatitis: Secondary | ICD-10-CM

## 2023-12-31 DIAGNOSIS — Z7984 Long term (current) use of oral hypoglycemic drugs: Secondary | ICD-10-CM

## 2023-12-31 DIAGNOSIS — E782 Mixed hyperlipidemia: Secondary | ICD-10-CM | POA: Diagnosis not present

## 2023-12-31 DIAGNOSIS — R42 Dizziness and giddiness: Secondary | ICD-10-CM

## 2023-12-31 MED ORDER — VALSARTAN-HYDROCHLOROTHIAZIDE 80-12.5 MG PO TABS: 1.0000 | ORAL_TABLET | Freq: Every day | ORAL | 3 refills | Status: AC

## 2023-12-31 MED ORDER — TIRZEPATIDE 10 MG/0.5ML ~~LOC~~ SOAJ: 10.0000 mg | SUBCUTANEOUS | 0 refills | Status: AC

## 2023-12-31 MED ORDER — CIPROFLOXACIN HCL 0.3 % OP SOLN: OPHTHALMIC | 0 refills | Status: AC

## 2023-12-31 NOTE — Patient Instructions (Addendum)
 It was very nice to see you today!  VISIT SUMMARY: During your visit, we addressed your right eye conjunctivitis, diabetes management, slightly elevated blood pressure, weight loss, GERD, chronic vertigo, and recurrent herpes. We made adjustments to your medications and provided new prescriptions where necessary.  YOUR PLAN: TYPE 2 DIABETES MELLITUS: Your blood sugar control has significantly improved, with your A1c dropping from 13 to 6.3. -We have discontinued Lantus . -Increased Mounjaro  to 10 mg weekly. -Continue taking metformin  1000 mg twice daily. -Monitor your blood glucose levels regularly.  HYPERTENSION: Your blood pressure is slightly elevated at 137/88 mmHg. -Started valsartan 80 mg with hydrochlorothiazide 12.5 mg daily. -Monitor your blood pressure at home. -We will check your kidney function and electrolytes within two weeks of starting the new medication.  OBESITY: Your BMI has decreased from 32 to 27, indicating significant weight loss. -Continue your current weight management strategies, including diet and exercise.  GASTROESOPHAGEAL REFLUX DISEASE (GERD): Your GERD is managed with omeprazole  40 mg. -Continue taking omeprazole  40 mg as prescribed.  CHRONIC VERTIGO: Your chronic vertigo is managed with a transdermal scopolamine  patch as needed. -Continue using the transdermal scopolamine  patch as needed.  RECURRENT GENITAL HERPES SIMPLEX: You have recurrent herpes labialis managed with valacyclovir  500 mg as needed for outbreaks. -Continue taking valacyclovir  500 mg as needed for outbreaks.  ACUTE CONJUNCTIVITIS, RIGHT EYE: You have right eye conjunctivitis with symptoms of itchiness, redness, and crusting for about a week. -Use ciprofloxacin eye drops every two hours for the first day, then taper over the next few days.  Return in 2 weeks for a lab visit.  Return in about 1 month (around 01/30/2024) for BP.   Take care, Arvella Hummer, MD, MS   PLEASE NOTE:  If  you had any lab tests, please let us  know if you have not heard back within a few days. You may see your results on mychart before we have a chance to review them but we will give you a call once they are reviewed by us .   If we ordered any referrals today, please let us  know if you have not heard from their office within the next week.   If you had any urgent prescriptions sent in today, please check with the pharmacy within an hour of our visit to make sure the prescription was transmitted appropriately.   Please try these tips to maintain a healthy lifestyle:  Eat at least 3 REAL meals and 1-2 snacks per day.  Aim for no more than 5 hours between eating.  If you eat breakfast, please do so within one hour of getting up.   Each meal should contain half fruits/vegetables, one quarter protein, and one quarter carbs (no bigger than a computer mouse)  Cut down on sweet beverages. This includes juice, soda, and sweet tea.   Drink at least 1 glass of water with each meal and aim for at least 8 glasses per day  Exercise at least 150 minutes every week.

## 2023-12-31 NOTE — Progress Notes (Signed)
 Assessment & Plan   Assessment/Plan:   Assessment and Plan Assessment & Plan Type 2 diabetes mellitus Significant improvement in glycemic control with A1c reduced from 13 to 6.3. Current regimen includes Lantus , Mounjaro , and metformin . - Discontinued Lantus . - Increased Mounjaro  to 10 mg weekly. - Continue metformin  1000 mg twice daily. - Monitor blood glucose levels regularly.  Hypertension Blood pressure is slightly elevated at 137/88 mmHg. Discussed the importance of blood pressure control in the context of diabetes and kidney protection. Consideration of valsartan with hydrochlorothiazide for its dual benefits of blood pressure control and kidney protection. - Initiated valsartan 80 mg with hydrochlorothiazide 12.5 mg daily. - Monitor blood pressure at home. - Will check kidney function and electrolytes within two weeks of starting new medication.  Obesity BMI has decreased from 32 to 27, indicating significant weight loss. Continued focus on weight management is important for overall health improvement. - Continue current weight management strategies, including diet and exercise.   Gastroesophageal reflux disease (GERD) GERD is managed with omeprazole  40 mg. - Continue omeprazole  40 mg as prescribed.  Chronic vertigo Managed with transdermal scopolamine  patch as needed. Previous association with vitamin D  deficiency has improved with supplementation. - Continue transdermal scopolamine  patch as needed.  Recurrent genital herpes simplex Managed with valacyclovir  500 mg as needed for outbreaks. - Continue valacyclovir  500 mg as needed for outbreaks.  Acute conjunctivitis, right eye Right eye conjunctivitis with symptoms of itchiness, redness, and crusting for about a week. Previous use of expired eye drops without improvement.  - Prescribed ciprofloxacin eye drops, to be used every two hours for the first day, then taper over the next few days.       Medications  Discontinued During This Encounter  Medication Reason   tirzepatide  (MOUNJARO ) 7.5 MG/0.5ML Pen    insulin  glargine (LANTUS  SOLOSTAR) 100 UNIT/ML Solostar Pen    clindamycin  (CLEOCIN ) 300 MG capsule     Return in about 1 month (around 01/30/2024) for BP.        Subjective:   Encounter date: 12/31/2023  Dana Rojas is a 53 y.o. female who has Hyperlipidemia; OBESITY; GESTATIONAL DIABETES; SCOLIOSIS; HEADACHE; CHICKENPOX, HX OF; Intramural leiomyoma of uterus; Menorrhagia with regular cycle; Hyperthyroidism; Sciatica of left side; New onset a-fib (HCC); Bradycardia, sinus; Non-intractable vomiting; Hypokalemia; Gastroenteritis; Hyperglycemia; Atrial fibrillation with RVR (HCC); Troponin level elevated; Flash pulmonary edema (HCC); Family history of coronary artery disease; Gastroesophageal reflux disease; Sensorineural hearing loss (SNHL), bilateral; Tinnitus of both ears; Vertigo; Type 2 diabetes mellitus with hyperglycemia, without long-term current use of insulin  (HCC); Hymenoptera allergy; Upper abdominal pain; Recurrent herpes labialis; Idiopathic urticaria; Allergic rhinitis; Allergic rhinitis due to animal hair and dander; Cellulitis of left upper extremity; and Primary hypertension on their problem list..   She  has a past medical history of Allergy, Atrial fibrillation with tachycardic ventricular rate (HCC) (12/08/2019), Primary hypertension (12/31/2023), Seasonal allergies, and Vitamin D  deficiency..   She presents with chief complaint of Diabetes (Pt is not fasting today//HM due- vaccinations and diabetic eye exam (record requested) ), Hypothyroidism, and Conjunctivitis (Pt c/o of right eye itchiness, redness and crust for 1 week. Pt is using OTC eye drops. ) .  Discussed the use of AI scribe software for clinical note transcription with the patient, who gave verbal consent to proceed.  History of Present Illness Dana Rojas is a 53 year old female who presents with  symptoms of conjunctivitis.  Ocular symptoms - Right eye itchiness, redness, and crustiness for approximately  one week - Attempted treatment with expired eye drops, possibly ciprofloxacin, without improvement  Glycemic control - Diabetes with significant improvement in A1c from 13 to 6.3 - Current medications: Lantus  20 units once daily, Mounjaro  7.5 mg weekly, metformin  1000 mg twice daily - Decrease in BMI from 32 to 27, indicating weight loss  Blood pressure status - Current blood pressure 137/88, slightly elevated - Home blood pressure monitoring by husband, described as 'good', but exact readings unknown - No chest pain or shortness of breath  Gastrointestinal symptoms - Gastroesophageal reflux disease managed with omeprazole  40 mg  Vestibular symptoms - Chronic vertigo, occasionally managed with transdermal scopolamine  patch 1 mg every 72 hours as needed - Previous association with vitamin D  deficiency, improved with supplementation  Recurrent herpetic lesions - Recurrent herpes labialis managed with valacyclovir  500 mg as needed for outbreaks  Physical activity and diet - Physically active at work with frequent walking - Adherence to a low-carbohydrate, low-fat diet   Type 2 diabetes mellitus  - Managed on Lantus  insulin  20 units once daily, Mounjaro  (tirzepatide ) 7.5 mg once weekly, and metformin  1000 mg twice daily.  - HgbA1c has improved significantly from 13% to 6.3%  Hypertension - Last blood pressure was 146/96 mmHg. - Was previously managed with metoprolol , but not any longer.   Hyperlipidemia - Managed with rosuvastatin  20 mg daily.  - Lipid Panel has improved with all values within normal limits except HDL being slightly low at 37 mg/dL.   Obesity - BMI initially 31.7.  - Managed with Mounjaro  (tirzepatide ) 7.5 mg once weekly  GERD - Managed with omeprazole  40 mg daily.  - Symptoms include stomach tenderness, likely heartburn-related.   Chronic  vertigo - Managed with transdermal scopolamine  patches 1 mg every 72 hours as needed.  - Her vertigo was previously associated with low vitamin D  levels, though her most recent Vitamin-D levels were normal.    Herpes labialis - Managed with valacyclovir  500 mg during outbreaks.  ROS  Past Surgical History:  Procedure Laterality Date   HERNIA REPAIR     HERNIA REPAIR     X2   LEFT HEART CATH AND CORONARY ANGIOGRAPHY N/A 12/11/2019   Procedure: LEFT HEART CATH AND CORONARY ANGIOGRAPHY;  Surgeon: Court Dorn PARAS, MD;  Location: [facility];  Service: Cardiovascular;  Laterality: N/A;   TUBAL LIGATION      Current Outpatient Medications on File Prior to Visit  Medication Sig Dispense Refill   albuterol  (VENTOLIN  HFA) 108 (90 Base) MCG/ACT inhaler Inhale 1-2 puffs into the lungs every 6 (six) hours as needed. 1 each 0   B-D UF III MINI PEN NEEDLES 31G X 5 MM MISC 1 EACH BY DOES NOT APPLY ROUTE 3 (THREE) TIMES DAILY WITH MEALS. 100 each 0   Blood Glucose Monitoring Suppl DEVI 1 each by Does not apply route in the morning, at noon, and at bedtime. May substitute to any manufacturer covered by patient's insurance. 1 each 0   EPINEPHrine  0.3 mg/0.3 mL IJ SOAJ injection Reason minister if you have any respiratory distress following an insect sting and call 911 1 each 2   fexofenadine  (ALLEGRA ) 180 MG tablet Take 1 tablet (180 mg total) by mouth every morning. 90 tablet 3   levocetirizine (XYZAL ) 5 MG tablet Take 1 tablet (5 mg total) by mouth every evening. 90 tablet 3   metFORMIN  (GLUCOPHAGE -XR) 500 MG 24 hr tablet Take 2 tablets (1,000 mg total) by mouth 2 (two) times daily with a  meal. 360 tablet 3   omeprazole  (PRILOSEC) 40 MG capsule Take 1 capsule (40 mg total) by mouth daily. 90 capsule 3   ondansetron  (ZOFRAN -ODT) 4 MG disintegrating tablet Take 1 tablet (4 mg total) by mouth every 8 (eight) hours as needed for nausea or vomiting. 20 tablet 0   ONETOUCH ULTRA test strip 1 EACH BY  IN VITRO ROUTE IN THE MORNING, AT NOON, AND AT BEDTIME 100 strip 0   rosuvastatin  (CRESTOR ) 20 MG tablet Take 1 tablet (20 mg total) by mouth daily. 90 tablet 3   scopolamine  (TRANSDERM-SCOP) 1 MG/3DAYS Place 1 patch (1.5 mg total) onto the skin every 3 (three) days. 10 patch 12   valACYclovir  (VALTREX ) 500 MG tablet TAKE 1 TABLET TWICE DAILY AT FIRST SIGN OF OUTBREAK FOR 3 TO 5 DAYS. 90 tablet 1   No current facility-administered medications on file prior to visit.    Family History  Problem Relation Age of Onset   Heart disease Mother    Heart disease Father    Diabetes Paternal Grandfather    Thyroid  disease Neg Hx     Social History   Socioeconomic History   Marital status: Married    Spouse name: Not on file   Number of children: Not on file   Years of education: Not on file   Highest education level: Not on file  Occupational History   Not on file  Tobacco Use   Smoking status: Never    Passive exposure: Never   Smokeless tobacco: Never  Vaping Use   Vaping status: Never Used  Substance and Sexual Activity   Alcohol use: No   Drug use: No   Sexual activity: Yes    Partners: Male    Birth control/protection: Surgical    Comment: tubal ligation   Other Topics Concern   Not on file  Social History Narrative   Not on file   Social Drivers of Health   Financial Resource Strain: Not on file  Food Insecurity: Not on file  Transportation Needs: Not on file  Physical Activity: Not on file  Stress: Not on file  Social Connections: Not on file  Intimate Partner Violence: Not on file                                                                                                  Objective:  Physical Exam: BP 137/88 (BP Location: Left Arm, Patient Position: Sitting, Cuff Size: Large)   Pulse 70   Temp (!) 97 F (36.1 C) (Temporal)   Resp 18   Wt 150 lb (68 kg)   LMP 12/24/2020   SpO2 99%   BMI 27.44 kg/m    Physical Exam  GENERAL: Alert, cooperative,  well developed, no acute distress HEENT: Normocephalic, normal oropharynx, moist mucous membranes, conjunctiva of right eye injected without discharge CHEST: Clear to auscultation bilaterally, no wheezes, rhonchi, or crackles CARDIOVASCULAR: Normal heart rate and rhythm, S1 and S2 normal without murmurs ABDOMEN: Soft, non-tender, non-distended, without organomegaly, normal bowel sounds EXTREMITIES: No cyanosis or edema NEUROLOGICAL: Cranial nerves grossly intact, moves all extremities  without gross motor or sensory deficit   Physical Exam  No results found.  Recent Results (from the past 2160 hours)  Hemoglobin A1c     Status: None   Collection Time: 12/13/23  9:22 AM  Result Value Ref Range   Hgb A1c MFr Bld 6.3 4.6 - 6.5 %    Comment: Glycemic Control Guidelines for People with Diabetes:Non Diabetic:  <6%Goal of Therapy: <7%Additional Action Suggested:  >8%   Thyroid  Panel With TSH     Status: None   Collection Time: 12/13/23  9:22 AM  Result Value Ref Range   T3 Uptake 29 22 - 35 %   T4, Total 10.6 5.1 - 11.9 mcg/dL   Free Thyroxine Index 3.1 1.4 - 3.8   TSH 0.64 mIU/L    Comment:           Reference Range .           > or = 20 Years  0.40-4.50 .                Pregnancy Ranges           First trimester    0.26-2.66           Second trimester   0.55-2.73           Third trimester    0.43-2.91   Urinalysis w microscopic + reflex cultur     Status: Abnormal   Collection Time: 12/13/23  9:22 AM   Specimen: Blood  Result Value Ref Range   Color, Urine DARK YELLOW YELLOW   APPearance TURBID (A) CLEAR   Specific Gravity, Urine 1.040 (H) 1.001 - 1.035   pH 5.5 5.0 - 8.0   Glucose, UA NEGATIVE NEGATIVE   Bilirubin Urine NEGATIVE NEGATIVE   Ketones, ur 1+ (A) NEGATIVE   Hgb urine dipstick NEGATIVE NEGATIVE   Protein, ur 2+ (A) NEGATIVE   Nitrites, Initial NEGATIVE NEGATIVE   Leukocyte Esterase 1+ (A) NEGATIVE   WBC, UA NONE SEEN 0 - 5 /HPF   RBC / HPF 10-20 (A) 0 - 2 /HPF    Squamous Epithelial / HPF 10-20 (A) < OR = 5 /HPF   Bacteria, UA MANY (A) NONE SEEN /HPF   Calcium  Oxalate Crystal MODERATE (A) NONE OR FEW /HPF   Hyaline Cast 0-5 (A) NONE SEEN /LPF   Yeast FEW (A) NONE SEEN /HPF   Note      Comment: This urine was analyzed for the presence of WBC,  RBC, bacteria, casts, and other formed elements.  Only those elements seen were reported. . .   Lipid panel     Status: Abnormal   Collection Time: 12/13/23  9:22 AM  Result Value Ref Range   Cholesterol 122 0 - 200 mg/dL    Comment: ATP III Classification       Desirable:  < 200 mg/dL               Borderline High:  200 - 239 mg/dL          High:  > = 759 mg/dL   Triglycerides 877.9 0.0 - 149.0 mg/dL    Comment: Normal:  <849 mg/dLBorderline High:  150 - 199 mg/dL   HDL 62.99 (L) >60.99 mg/dL   VLDL 75.5 0.0 - 59.9 mg/dL   LDL Cholesterol 61 0 - 99 mg/dL   Total CHOL/HDL Ratio 3     Comment:  Men          Women1/2 Average Risk     3.4          3.3Average Risk          5.0          4.42X Average Risk          9.6          7.13X Average Risk          15.0          11.0                       NonHDL 85.46     Comment: NOTE:  Non-HDL goal should be 30 mg/dL higher than patient's LDL goal (i.e. LDL goal of < 70 mg/dL, would have non-HDL goal of < 100 mg/dL)  Comp Met (CMET)     Status: None   Collection Time: 12/13/23  9:22 AM  Result Value Ref Range   Sodium 142 135 - 145 mEq/L   Potassium 4.2 3.5 - 5.1 mEq/L   Chloride 104 96 - 112 mEq/L   CO2 27 19 - 32 mEq/L   Glucose, Bld 73 70 - 99 mg/dL   BUN 23 6 - 23 mg/dL   Creatinine, Ser 9.37 0.40 - 1.20 mg/dL   Total Bilirubin 0.5 0.2 - 1.2 mg/dL   Alkaline Phosphatase 69 39 - 117 U/L   AST 23 0 - 37 U/L   ALT 27 0 - 35 U/L   Total Protein 8.1 6.0 - 8.3 g/dL   Albumin 4.8 3.5 - 5.2 g/dL   GFR 898.43 >39.99 mL/min    Comment: Calculated using the CKD-EPI Creatinine Equation (2021)   Calcium  9.7 8.4 - 10.5 mg/dL  CBC w/Diff     Status:  Abnormal   Collection Time: 12/13/23  9:22 AM  Result Value Ref Range   WBC 8.6 4.0 - 10.5 K/uL   RBC 5.28 (H) 3.87 - 5.11 Mil/uL   Hemoglobin 13.3 12.0 - 15.0 g/dL   HCT 58.7 63.9 - 53.9 %   MCV 78.1 78.0 - 100.0 fl   MCHC 32.3 30.0 - 36.0 g/dL   RDW 84.7 88.4 - 84.4 %   Platelets 308.0 150.0 - 400.0 K/uL   Neutrophils Relative % 70.3 43.0 - 77.0 %   Lymphocytes Relative 21.7 12.0 - 46.0 %   Monocytes Relative 5.4 3.0 - 12.0 %   Eosinophils Relative 2.4 0.0 - 5.0 %   Basophils Relative 0.2 0.0 - 3.0 %   Neutro Abs 6.0 1.4 - 7.7 K/uL   Lymphs Abs 1.9 0.7 - 4.0 K/uL   Monocytes Absolute 0.5 0.1 - 1.0 K/uL   Eosinophils Absolute 0.2 0.0 - 0.7 K/uL   Basophils Absolute 0.0 0.0 - 0.1 K/uL  Urine Culture     Status: None   Collection Time: 12/13/23  9:22 AM  Result Value Ref Range   MICRO NUMBER: [id number]    SPECIMEN QUALITY: Adequate    Sample Source URINE    STATUS: FINAL    ISOLATE 1:      Less than 10,000 CFU/mL of single Gram positive organism isolated. No further testing will be performed. If clinically indicated, recollection using a method to minimize contamination, with prompt transfer to Urine Culture Transport Tube, is recommended.  REFLEXIVE URINE CULTURE     Status: None   Collection Time: 12/13/23  9:22 AM  Result Value Ref Range  REFLEXIVE URINE CULTURE      Comment: CULTURE INDICATED - RESULTS TO FOLLOW        Beverley KATHEE Hummer, MD  I,Emily Lagle,acting as a scribe for Beverley KATHEE Hummer, MD.,have documented all relevant documentation on the behalf of Beverley KATHEE Hummer, MD.  Dana Rojas Beverley KATHEE Hummer, MD, have reviewed all documentation for this visit. The documentation on 12/31/2023 for the exam, diagnosis, procedures, and orders are all accurate and complete.

## 2024-01-04 ENCOUNTER — Other Ambulatory Visit

## 2024-01-14 ENCOUNTER — Other Ambulatory Visit

## 2024-01-14 DIAGNOSIS — I10 Essential (primary) hypertension: Secondary | ICD-10-CM

## 2024-01-14 NOTE — Addendum Note (Signed)
 Addended by: ALTO PARODY D on: 01/14/2024 03:11 PM   Modules accepted: Orders

## 2024-01-15 LAB — RENAL FUNCTION PANEL
Albumin: 4.4 g/dL (ref 3.6–5.1)
BUN: 19 mg/dL (ref 7–25)
CO2: 28 mmol/L (ref 20–32)
Calcium: 9.5 mg/dL (ref 8.6–10.4)
Chloride: 107 mmol/L (ref 98–110)
Creat: 0.53 mg/dL (ref 0.50–1.03)
Glucose, Bld: 120 mg/dL — ABNORMAL HIGH (ref 65–99)
Phosphorus: 4.6 mg/dL — ABNORMAL HIGH (ref 2.5–4.5)
Potassium: 4.3 mmol/L (ref 3.5–5.3)
Sodium: 145 mmol/L (ref 135–146)

## 2024-01-18 ENCOUNTER — Ambulatory Visit: Payer: Self-pay | Admitting: Family

## 2024-01-27 ENCOUNTER — Ambulatory Visit: Admitting: Family Medicine

## 2024-01-27 ENCOUNTER — Other Ambulatory Visit: Payer: Self-pay

## 2024-01-27 NOTE — Progress Notes (Unsigned)
   01/27/24  Patient ID: Dana Rojas, female   DOB: Oct 27, 1970, 53 y.o.   MRN: [id number]  Outreach attempt for scheduled telephone follow-up visit was not successful.  I did leave a HIPAA compliant voicemail with my direct phone number and am sending a MyChart message to attempt to reschedule.  [name], PharmD, DPLA

## 2024-01-31 ENCOUNTER — Telehealth: Payer: Self-pay

## 2024-01-31 NOTE — Progress Notes (Signed)
   01/31/24  Patient ID: Dana Rojas, female   DOB: 09-30-1970, 52 y.o.   MRN: [id number]  Patient tried to call in regard to missed telephone visit this week, so I attempted to contact to follow-up on medication management of chronic disease states.  I was not able to reach the patient but did leave a HIPAA compliant voicemail with my direct number.  I will try to reach her again next week if I do not hear back.  [name], PharmD, DPLA

## 2024-02-15 ENCOUNTER — Ambulatory Visit: Admitting: Family Medicine

## 2024-02-16 NOTE — Progress Notes (Unsigned)
" ° °  02/21/24  Patient ID: Dana Rojas, female   DOB: [birth year], 53 y.o.   MRN: [id number]  Subjective/Objective Telephone visit to follow-up on management of diabetes   Diabetes: Current medications: Mounjaro  10mg  weekly, metformin  1000mg  BID Patient has completed approximately 8 weeks of Mounjaro  10mg  weekly and endorses tolerating medication well. Lantus  was stopped when Mounjaro  dose increased to 10mg  weekly Patient still using metformin  1000mg  BID, but she is inquiring about possibility to stop in the near future.  She states she will forget nighttime dose sometimes, and FBG will still be <110 the next morning She does monitor both FBG and states this is ranging 75-105 ACEi/ARB for cardiorenal protection:  yes, valsartan /hydrochlorothiazide  80/12.5mg  started in November UACR 30.6 on 5/30 Statin for ASCVD risk reduction: rosuvastatin  20mg  daily  Lab Results  Component Value Date   HGBA1C 6.3 12/13/2023   HGBA1C 13.1 (A) 06/23/2023   HGBA1C 9.7 (H) 12/18/2022    Hypertension: -Current medications:  valsartan /hydrochlorothiazide  80/12.5mg  daily -Patient does have home BP monitor but does not monitor regularly -BP slightly elevated at 137/88 at PCP visit 11/7, so valsartan /hydrochlorothiazide  was started  Hyperlipidemia: -Current medications: rosuvastatin  20 mg daily     Component Value Date/Time   CHOL 122 12/13/2023 0922   TRIG 122.0 12/13/2023 0922   HDL 37.00 (L) 12/13/2023 0922   CHOLHDL 3 12/13/2023 0922   VLDL 24.4 12/13/2023 0922   LDLCALC 61 12/13/2023 0922   LDLDIRECT 130.0 08/03/2022 1119    Assessment/Plan   Diabetes: -A1c at goal of <7% -LDL at goal of <70 -UACR slightly elevated, but I would not recommend SGLT2 at this time based on presence of ketones seen on 10/20 urinalysis -Last OV BP slightly elevated with goal <130/80 -Continue Mounjaro  10mg  weekly and metformin  1000mg  BID at this time -Due for A1c 1/20- if <6.5%, consider stopping metformin   to decrease pill burden. Can increase Mounjaro  further to 12.5mg  weekly for diabetes control and weight loss if needed/desired -Due for UACR and CMP at next PCP visit  Hypertension: -Continue current regimen -Begin monitoring and recording home BP at least 2x/week; notify PCP or me if consistently >130/80- if so, consider increasing valsartan /hydrochlorothiazide  dosing  Hyperlipidemia: -Controlled -Continue current regimen -Due for lipid panel in another 3-6 months   Follow Up Plan:  Patient prefers to have lab work done prior to PCP follow-up, so results can be discussed at visit.  Coordinating with PCP to place lab orders, and patient will schedule lab visit for after 03/14/24 (when due) and a PCP follow-up soon after.  I have a telephone follow-up with the patient in 3 months.   [name], PharmD, DPLA  "

## 2024-02-21 ENCOUNTER — Other Ambulatory Visit: Payer: Self-pay

## 2024-02-21 DIAGNOSIS — I10 Essential (primary) hypertension: Secondary | ICD-10-CM

## 2024-02-21 DIAGNOSIS — E1165 Type 2 diabetes mellitus with hyperglycemia: Secondary | ICD-10-CM

## 2024-05-23 ENCOUNTER — Other Ambulatory Visit
